# Patient Record
Sex: Male | Born: 1945 | Race: White | Hispanic: No | Marital: Married | Smoking: Current every day smoker
Health system: Southern US, Community
[De-identification: ages and names within clinical notes are randomized; demographics above are authoritative.]

## PROBLEM LIST (undated history)

## (undated) DIAGNOSIS — E669 Obesity, unspecified: Secondary | ICD-10-CM

## (undated) DIAGNOSIS — D751 Secondary polycythemia: Secondary | ICD-10-CM

## (undated) DIAGNOSIS — I10 Essential (primary) hypertension: Secondary | ICD-10-CM

## (undated) DIAGNOSIS — K5792 Diverticulitis of intestine, part unspecified, without perforation or abscess without bleeding: Secondary | ICD-10-CM

## (undated) DIAGNOSIS — G4733 Obstructive sleep apnea (adult) (pediatric): Secondary | ICD-10-CM

## (undated) DIAGNOSIS — K635 Polyp of colon: Secondary | ICD-10-CM

## (undated) HISTORY — DX: Essential (primary) hypertension: I10

## (undated) HISTORY — DX: Obstructive sleep apnea (adult) (pediatric): G47.33

## (undated) HISTORY — DX: Diverticulitis of intestine, part unspecified, without perforation or abscess without bleeding: K57.92

## (undated) HISTORY — DX: Secondary polycythemia: D75.1

## (undated) HISTORY — DX: Polyp of colon: K63.5

## (undated) HISTORY — DX: Obesity, unspecified: E66.9

## (undated) HISTORY — PX: EYE SURGERY: SHX253

---

## 1999-06-05 ENCOUNTER — Inpatient Hospital Stay (HOSPITAL_COMMUNITY): Admission: EM | Admit: 1999-06-05 | Discharge: 1999-06-09 | Payer: Self-pay | Admitting: Emergency Medicine

## 1999-06-05 ENCOUNTER — Encounter: Payer: Self-pay | Admitting: Internal Medicine

## 1999-06-06 ENCOUNTER — Encounter: Payer: Self-pay | Admitting: Emergency Medicine

## 1999-06-08 ENCOUNTER — Encounter: Payer: Self-pay | Admitting: Internal Medicine

## 2003-03-05 HISTORY — PX: COLONOSCOPY: SHX174

## 2003-03-11 ENCOUNTER — Ambulatory Visit (HOSPITAL_COMMUNITY): Admission: RE | Admit: 2003-03-11 | Discharge: 2003-03-11 | Payer: Self-pay | Admitting: Gastroenterology

## 2003-03-11 ENCOUNTER — Encounter (INDEPENDENT_AMBULATORY_CARE_PROVIDER_SITE_OTHER): Payer: Self-pay

## 2005-04-22 ENCOUNTER — Ambulatory Visit: Payer: Self-pay | Admitting: Oncology

## 2005-05-05 ENCOUNTER — Ambulatory Visit: Admission: RE | Admit: 2005-05-05 | Discharge: 2005-05-05 | Payer: Self-pay | Admitting: Oncology

## 2006-03-31 ENCOUNTER — Ambulatory Visit: Payer: Self-pay | Admitting: Family Medicine

## 2006-04-04 ENCOUNTER — Encounter: Admission: RE | Admit: 2006-04-04 | Discharge: 2006-04-04 | Payer: Self-pay | Admitting: Family Medicine

## 2006-04-04 LAB — HM MAMMOGRAPHY: HM Mammogram: NEGATIVE

## 2006-08-12 ENCOUNTER — Ambulatory Visit: Payer: Self-pay | Admitting: Family Medicine

## 2006-09-13 ENCOUNTER — Ambulatory Visit: Payer: Self-pay | Admitting: Family Medicine

## 2008-01-30 ENCOUNTER — Ambulatory Visit: Payer: Self-pay | Admitting: Family Medicine

## 2008-03-25 LAB — HM COLONOSCOPY

## 2008-04-08 ENCOUNTER — Ambulatory Visit: Payer: Self-pay | Admitting: Family Medicine

## 2009-07-31 ENCOUNTER — Ambulatory Visit: Payer: Self-pay | Admitting: Family Medicine

## 2009-09-10 ENCOUNTER — Ambulatory Visit: Payer: Self-pay | Admitting: Family Medicine

## 2010-03-03 ENCOUNTER — Ambulatory Visit: Payer: Self-pay | Admitting: Family Medicine

## 2010-03-12 ENCOUNTER — Ambulatory Visit: Payer: Self-pay | Admitting: Oncology

## 2010-03-17 LAB — CBC WITH DIFFERENTIAL/PLATELET
BASO%: 0.7 % (ref 0.0–2.0)
Basophils Absolute: 0.1 10*3/uL (ref 0.0–0.1)
EOS%: 2.7 % (ref 0.0–7.0)
Eosinophils Absolute: 0.3 10*3/uL (ref 0.0–0.5)
HCT: 50.6 % — ABNORMAL HIGH (ref 38.4–49.9)
HGB: 17.2 g/dL — ABNORMAL HIGH (ref 13.0–17.1)
LYMPH%: 26.1 % (ref 14.0–49.0)
MCH: 30.8 pg (ref 27.2–33.4)
MCHC: 34 g/dL (ref 32.0–36.0)
MCV: 90.6 fL (ref 79.3–98.0)
MONO#: 0.9 10*3/uL (ref 0.1–0.9)
MONO%: 9.4 % (ref 0.0–14.0)
NEUT#: 5.8 10*3/uL (ref 1.5–6.5)
NEUT%: 61.1 % (ref 39.0–75.0)
Platelets: 167 10*3/uL (ref 140–400)
RBC: 5.58 10*6/uL (ref 4.20–5.82)
RDW: 13.5 % (ref 11.0–14.6)
WBC: 9.5 10*3/uL (ref 4.0–10.3)
lymph#: 2.5 10*3/uL (ref 0.9–3.3)

## 2010-03-17 LAB — COMPREHENSIVE METABOLIC PANEL
ALT: 20 U/L (ref 0–53)
AST: 20 U/L (ref 0–37)
Albumin: 3.9 g/dL (ref 3.5–5.2)
Alkaline Phosphatase: 68 U/L (ref 39–117)
BUN: 18 mg/dL (ref 6–23)
CO2: 26 mEq/L (ref 19–32)
Calcium: 9.1 mg/dL (ref 8.4–10.5)
Chloride: 102 mEq/L (ref 96–112)
Creatinine, Ser: 1.16 mg/dL (ref 0.40–1.50)
Glucose, Bld: 103 mg/dL — ABNORMAL HIGH (ref 70–99)
Potassium: 3.6 mEq/L (ref 3.5–5.3)
Sodium: 139 mEq/L (ref 135–145)
Total Bilirubin: 0.6 mg/dL (ref 0.3–1.2)
Total Protein: 6.4 g/dL (ref 6.0–8.3)

## 2010-03-20 LAB — JAK-2 V617F

## 2010-03-30 ENCOUNTER — Encounter: Payer: Self-pay | Admitting: Pulmonary Disease

## 2010-04-13 ENCOUNTER — Ambulatory Visit: Payer: Self-pay | Admitting: Oncology

## 2010-04-24 DIAGNOSIS — I10 Essential (primary) hypertension: Secondary | ICD-10-CM | POA: Insufficient documentation

## 2010-04-24 DIAGNOSIS — D751 Secondary polycythemia: Secondary | ICD-10-CM | POA: Insufficient documentation

## 2010-04-24 DIAGNOSIS — E669 Obesity, unspecified: Secondary | ICD-10-CM | POA: Insufficient documentation

## 2010-04-27 ENCOUNTER — Ambulatory Visit: Payer: Self-pay | Admitting: Pulmonary Disease

## 2010-04-27 DIAGNOSIS — Z9989 Dependence on other enabling machines and devices: Secondary | ICD-10-CM | POA: Insufficient documentation

## 2010-04-27 DIAGNOSIS — G4733 Obstructive sleep apnea (adult) (pediatric): Secondary | ICD-10-CM | POA: Insufficient documentation

## 2010-05-26 ENCOUNTER — Ambulatory Visit (HOSPITAL_BASED_OUTPATIENT_CLINIC_OR_DEPARTMENT_OTHER): Admission: RE | Admit: 2010-05-26 | Discharge: 2010-05-26 | Payer: Self-pay | Admitting: Pulmonary Disease

## 2010-05-26 ENCOUNTER — Encounter: Payer: Self-pay | Admitting: Pulmonary Disease

## 2010-06-03 ENCOUNTER — Ambulatory Visit: Payer: Self-pay | Admitting: Pulmonary Disease

## 2010-06-04 ENCOUNTER — Telehealth (INDEPENDENT_AMBULATORY_CARE_PROVIDER_SITE_OTHER): Payer: Self-pay | Admitting: *Deleted

## 2010-06-11 ENCOUNTER — Ambulatory Visit: Payer: Self-pay | Admitting: Pulmonary Disease

## 2010-07-31 ENCOUNTER — Ambulatory Visit: Payer: Self-pay | Admitting: Pulmonary Disease

## 2010-08-14 ENCOUNTER — Encounter: Payer: Self-pay | Admitting: Pulmonary Disease

## 2010-11-03 NOTE — Assessment & Plan Note (Signed)
Summary: consult for osa   Visit Type:  Initial Consult Copy to:  Eli Hose Primary Provider/Referring Provider:  Sharlot Gowda, MD  CC:  Sleep Consult.  History of Present Illness: The pt is a 64y/o male who I have been asked to see for possible osa.  The pt has a h/o erythrocytosis, as well as retinal vein thrombosis.  The question has been raised whether SDB could be contributed to this.  The pt has been noted to have loud snoring, as well as pauses in his breathing during sleep.  He goes to bed around 11pm, and arises at 6:30am to start his day.  He feels that he is reasonably well rested most days, and feels that his alertness is normal during quiet times.  He will get some sleepiness while in "dark places".  He has some dozing in the evenings  while watching tv, and mild sleep pressure with driving longer distances.  HIs epworth scale today is 7.  His weight is up 15 pounds over the last 2 years.    Preventive Screening-Counseling & Management  Alcohol-Tobacco     Smoking Status: current     Packs/Day: 1.0     Year Started: 1979  Current Medications (verified): 1)  Aspirin 81 Mg Tbec (Aspirin) .... Take 1 Tablet By Mouth Once A Day 2)  Hydrochlorothiazide 12.5 Mg Caps (Hydrochlorothiazide) .... Take 1 Tablet By Mouth Once A Day 3)  Fish Oil 1200 Mg Caps (Omega-3 Fatty Acids) .... Take 1 Tablet By Mouth Once A Day 4)  Nasal Spray .Marland Kitchen.. 2 Sprays Each Nostril Once Daily  Allergies (verified): No Known Drug Allergies  Past History:  Past Medical History: Erythrocytosis OBESITY (ICD-278.00) HYPERTENSION (ICD-401.9) retinal vein thrombosis  Past Surgical History: none  Family History: Reviewed history and no changes required. Family History Breast Cancer-mother Family History MI/Heart Attack-father  Social History: Reviewed history from 04/24/2010 and no changes required. pt is married.-Saabir Blyth pt has 1 son. pt is retired from [employer]. pt smokes  intermittently x 30 years. Patient is a current smoker. (intermittent upon daily stressors) Smoking Status:  current Packs/Day:  1.0  Review of Systems       The patient complains of shortness of breath with activity, acid heartburn, nasal congestion/difficulty breathing through nose, and sneezing.  The patient denies shortness of breath at rest, productive cough, non-productive cough, coughing up blood, chest pain, irregular heartbeats, indigestion, loss of appetite, weight change, abdominal pain, difficulty swallowing, sore throat, tooth/dental problems, headaches, itching, ear ache, anxiety, depression, hand/feet swelling, joint stiffness or pain, rash, change in color of mucus, and fever.    Vital Signs:  Patient profile:   65 year old male Height:      73 inches Weight:      305 pounds BMI:     40.39 O2 Sat:      94 % on Room air Temp:     98.1 degrees F oral Pulse rate:   66 / minute BP sitting:   130 / 84  (left arm) Cuff size:   large  Vitals Entered By: Arman Filter LPN (April 27, 2010 10:18 AM)  O2 Flow:  Room air CC: Sleep Consult Comments Medications reviewed with patient Arman Filter LPN  April 27, 2010 10:18 AM    Physical Exam  General:  obese male in nad Nose:  very large turbinates with near obstruction bilaterlly.  septal deviation to left Mouth:  severe elongation of soft palate and uvula, +side wall narrowing.  Neck:  no jvd, tmg, LN Lungs:  clear to auscultation Heart:  rrr, no mrg Abdomen:  soft and nontender, bs+ Extremities:  no significant edema noted, no cyanosis pulses intact distally Neurologic:  alert and oriented, moves all 4.   Impression & Recommendations:  Problem # 1:  OBSTRUCTIVE SLEEP APNEA (ICD-327.23) the pt's history is suggestive of osa, and related chronic nocturnal hypoxemia could lead to erythrocytosis.  He is morbidly obese, and has very abnormal upper airway anatomy.  I have had a long discussion with the pt about sleep  apnea, including its impact on QOL and CV health. I think he needs to have a sleep study at this point, and he is agreeable.  Medications Added to Medication List This Visit: 1)  Aspirin 81 Mg Tbec (Aspirin) .... Take 1 tablet by mouth once a day 2)  Hydrochlorothiazide 12.5 Mg Caps (Hydrochlorothiazide) .... Take 1 tablet by mouth once a day 3)  Fish Oil 1200 Mg Caps (Omega-3 fatty acids) .... Take 1 tablet by mouth once a day 4)  Nasal Spray  .Marland Kitchen.. 2 sprays each nostril once daily  Other Orders: Consultation Level IV (16109) Sleep Disorder Referral (Sleep Disorder)  Patient Instructions: 1)  will setup for sleep study, and will arrange followup once results are available.   Immunization History:  Influenza Immunization History:    Influenza:  historical (07/07/2009)  Pneumovax Immunization History:    Pneumovax:  historical (04/08/2008)

## 2010-11-03 NOTE — Assessment & Plan Note (Signed)
Summary: rov for osa   Visit Type:  Follow-up Primary Provider/Referring Provider:  Sharlot Gowda, MD  CC:  CPAP follow-up...he has adjusted to the pressure...thinks humidity may be too high...wears approx 7 hours every night...still adjusting to the mask.  History of Present Illness: the pt comes in today for f/u of his osa.  At the last visit, he was started on cpap, and overall has been doing well with the device.  He is sleeping better, and has seen an improvement in his daytime alertness.  He is having issues with excessive humidity, but was not aware of the heater adjustment that would decrease this.  He also has some issue with mask fit and claustrophobia, but this is improving.    Current Medications (verified): 1)  Aspirin 81 Mg Tbec (Aspirin) .... Take 1 Tablet By Mouth Once A Day 2)  Hydrochlorothiazide 12.5 Mg Caps (Hydrochlorothiazide) .... Take 1 Tablet By Mouth Once A Day 3)  Fish Oil 1200 Mg Caps (Omega-3 Fatty Acids) .... Take 1 Tablet By Mouth Once A Day 4)  Nasal Spray .Marland Kitchen.. 2 Sprays Each Nostril Once Daily  Allergies (verified): No Known Drug Allergies  Past History:  Past medical, surgical, family and social histories (including risk factors) reviewed, and no changes noted (except as noted below).  Past Medical History: Reviewed history from 04/27/2010 and no changes required. Erythrocytosis OBESITY (ICD-278.00) HYPERTENSION (ICD-401.9) retinal vein thrombosis  Past Surgical History: Reviewed history from 04/27/2010 and no changes required. none  Family History: Reviewed history from 04/27/2010 and no changes required. Family History Breast Cancer-mother Family History MI/Heart Attack-father  Social History: Reviewed history from 04/27/2010 and no changes required. pt is married.-Macen Joslin pt has 1 son. pt is retired from [employer]. pt smokes intermittently x 30 years. Patient is a current smoker. (intermittent upon daily stressors)  Review of  Systems       The patient complains of nasal congestion/difficulty breathing through nose.  The patient denies shortness of breath with activity, shortness of breath at rest, productive cough, non-productive cough, coughing up blood, chest pain, irregular heartbeats, acid heartburn, indigestion, loss of appetite, weight change, abdominal pain, difficulty swallowing, sore throat, tooth/dental problems, headaches, sneezing, itching, ear ache, anxiety, depression, hand/feet swelling, joint stiffness or pain, rash, change in color of mucus, and fever.    Vital Signs:  Patient profile:   65 year old male Height:      73 inches (185.42 cm) Weight:      313 pounds (142.27 kg) BMI:     41.44 O2 Sat:      96 % on Room air Temp:     97.6 degrees F (36.44 degrees C) oral Pulse rate:   65 / minute BP sitting:   140 / 84  (left arm) Cuff size:   large  Vitals Entered By: Michel Bickers CMA (July 31, 2010 10:51 AM)  O2 Sat at Rest %:  96 O2 Flow:  Room air CC: CPAP follow-up...he has adjusted to the pressure...thinks humidity may be too high...wears approx 7 hours every night...still adjusting to the mask Comments Medications reviewed with patient Michel Bickers [facility]  July 31, 2010 10:52 AM   Physical Exam  General:  obese male in nad Nose:  no skin breakdown or pressure necrosis from cpap mask. no drainage or purulence Lungs:  clear to auscultation Extremities:  no significant edema or cyanosis  Neurologic:  alert and oriented, does not appear sleepy, moves all 4.   Impression & Recommendations:  Problem # 1:  OBSTRUCTIVE SLEEP APNEA (ICD-327.23) the pt has been wearing cpap compliantly, and clearly sees an improvement in his sleep and daytime alertness.  He is having small tolerance issues with humidity, claustrophobia, mask, and I have addressed each one of these with him.  He will work on troubleshooting.  Will need to optimize his pressure for him, and I have encouraged him to work  aggressively on weight loss. Care Plan:  At this point, will arrange for the patient's machine to be changed over to auto mode for 2 weeks to optimize their pressure.  I will review the downloaded data once sent by dme, and also evaluate for compliance, leaks, and residual osa.  I will call the patient and dme to discuss the results, and have the patient's machine set appropriately.  This will serve as the pt's cpap pressure titration.  Other Orders: Est. Patient Level IV (16109) DME Referral (DME)  Patient Instructions: 1)  adjust heated humidity up or down to your comfort.  I suspect you need more. 2)  will set your machine on auto mode for 2 weeks, and will call you with your optimal pressure. 3)  work on weight loss 4)  followup with me in 6mos.   Immunization History:  Influenza Immunization History:    Influenza:  historical (07/10/2010)

## 2010-11-03 NOTE — Progress Notes (Signed)
Summary: need to sched ov with kc   Phone Note Outgoing Call Call back at [facility] Phone [phone]   Call placed by: Arman Filter LPN,  June 04, 2010 4:53 PM Call placed to: Patient Summary of Call: per kc, pt needs ov with kc to discuss sleep study results.  ATC pt at home #.  Line busy x 3.  Will try back again later.  Aundra Millet Reynolds LPN  June 04, 2010 4:53 PM  Initial call taken by: Arman Filter LPN,  June 04, 2010 4:53 PM  Follow-up for Phone Call        called and spoke with pt.  pt scheduled to see [facility] 06-11-2010 at 9:45am.  Arman Filter LPN  June 05, 2010 5:07 PM     n

## 2010-11-03 NOTE — Assessment & Plan Note (Signed)
Summary: rov to discuss sleep study results/lmr   Copy to:  Milderd Meager Primary Provider/Referring Provider:  Sharlot Gowda, MD  CC:  Follow up on sleep study results and Pt wants to have sinus surgery and is not able to due to diffuculty breathing  through the nose.  History of Present Illness: the pt comes in today for f/u of his recent sleep study.  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  I have reviewed the study in detail with the pt, and answered all of his questions.  Preventive Screening-Counseling & Management  Alcohol-Tobacco     Smoking Status: quit     Packs/Day: 1.0     Year Started: 1979     Year Quit: 2011  Current Medications (verified): 1)  Aspirin 81 Mg Tbec (Aspirin) .... Take 1 Tablet By Mouth Once A Day 2)  Hydrochlorothiazide 12.5 Mg Caps (Hydrochlorothiazide) .... Take 1 Tablet By Mouth Once A Day 3)  Fish Oil 1200 Mg Caps (Omega-3 Fatty Acids) .... Take 1 Tablet By Mouth Once A Day 4)  Nasal Spray .Marland Kitchen.. 2 Sprays Each Nostril Once Daily  Allergies (verified): No Known Drug Allergies  Social History: Smoking Status:  quit  Review of Systems       The patient complains of indigestion, nasal congestion/difficulty breathing through nose, and sneezing.  The patient denies shortness of breath with activity, shortness of breath at rest, productive cough, non-productive cough, coughing up blood, chest pain, irregular heartbeats, acid heartburn, loss of appetite, weight change, abdominal pain, difficulty swallowing, sore throat, tooth/dental problems, headaches, itching, ear ache, anxiety, depression, hand/feet swelling, joint stiffness or pain, rash, change in color of mucus, and fever.    Vital Signs:  Patient profile:   65 year old male Height:      73 inches Weight:      306.50 pounds O2 Sat:      97 % on Room air Temp:     98.0 degrees F oral Pulse rate:   70 / minute BP sitting:   158 / 96  (left arm) Cuff  size:   large  Vitals Entered By: Carver Fila (June 11, 2010 9:31 AM)  O2 Flow:  Room air CC: Follow up on sleep study results, Pt wants to have sinus surgery and is not able to due to diffuculty breathing  through the nose Is Patient Diabetic? No Comments meds and allergies updated Phone number updated Carver Fila  June 11, 2010 9:31 AM    Physical Exam  General:  obese male in nad Nose:  obstructed bilat. Extremities:  no edema or cyanosis  Neurologic:  alert, does not appear sleepy, moves all 4.   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  I have discussed his treatment options with him, but asked him to focus primarily on cpap and weight loss.  I will set the patient up on cpap at a moderate pressure level to allow for desensitization, and will troubleshoot the device over the next 4-6weeks if needed.  The pt is to call me if having issues with tolerance.  Will then optimize the pressure once patient is able to wear cpap on a consistent basis.  Other Orders: Est. Patient Level III (16109) DME Referral (DME)  Patient Instructions: 1)  will start on cpap for your sleep apnea.  Please call if  having tolerance issues. 2)  work on weight loss 3)  followup with me in 5 weeks

## 2010-11-03 NOTE — Letter (Signed)
Summary: Statement of Medical Necessity/ [facility]  Statement of Medical Necessity/ [facility]   Imported By: Lennie Odor 08/18/2010 11:41:31  _____________________________________________________________________  External Attachment:    Type:   Image     Comment:   External Document

## 2010-11-03 NOTE — Letter (Signed)
Summary: [facility]  [facility]   Imported By: Lester Milford 05/14/2010 09:14:09  _____________________________________________________________________  External Attachment:    Type:   Image     Comment:   External Document

## 2011-02-10 ENCOUNTER — Telehealth: Payer: Self-pay | Admitting: Family Medicine

## 2011-02-10 MED ORDER — HYDROCHLOROTHIAZIDE 12.5 MG PO CAPS: 12.5000 mg | ORAL_CAPSULE | Freq: Every day | ORAL | Status: DC

## 2011-02-10 MED ORDER — FLUNISOLIDE 25 MCG/ACT (0.025%) NA SOLN: 2.0000 | Freq: Two times a day (BID) | NASAL | Status: DC

## 2011-02-10 NOTE — Telephone Encounter (Signed)
PT NEEDS REFILL HCTZ & FLONASE TO [facility], PT IS GOING OUT OF THE COUN[place] FOR 2 MONTHS, HAS CPE SCHEDULED 04/12/11

## 2011-02-15 ENCOUNTER — Other Ambulatory Visit: Payer: Self-pay | Admitting: Family Medicine

## 2011-02-15 NOTE — Telephone Encounter (Signed)
Pt called said we order Flucasolide and he uses Fluticson 50 mcg nasal spray 2 sprays qd - bid.  [facility].  Pt does not want the Flucasolide.

## 2011-02-15 NOTE — Telephone Encounter (Signed)
[facility] got it fixed pt has re fills on  His flonase  Pharmacy informed me  Pt informed med at [facility]

## 2011-02-19 NOTE — Op Note (Signed)
   NAME:  LYELL, CLUGSTON NO.:  [id number]   MEDICAL RECORD NO.:  [id number]                   PATIENT TYPE:  AMB   LOCATION:  ENDO                                 FACILITY:  [facility]   PHYSICIAN:  Danise Edge, M.D.                DATE OF BIRTH:  January 17, 1946   DATE OF PROCEDURE:  03/11/2003  DATE OF DISCHARGE:                                 OPERATIVE REPORT   PROCEDURE:  Screening colonoscopy with polypectomy.   INDICATIONS FOR PROCEDURE:  Mr. Martavion Couper is a 65 year old male born  1945-11-15. On December 23, 1997, proctocolonoscopy to the cecum resulted in  the removal of a 2 mm adenomatous polyp from the distal colon. Mr. Thomann  is scheduled to undergo a repeat screening colonoscopy with polypectomy to  prevent colon cancer.   ENDOSCOPIST:  Charolett Bumpers, M.D.   PREMEDICATION:  Versed 10 mg, Demerol 50 mg .   DESCRIPTION OF PROCEDURE:  After obtaining informed consent, Mr. Lipson  was placed in the left lateral decubitus position. I administered  intravenous Demerol and intravenous Versed to achieve conscious sedation for  the procedure. The patient's blood pressure, oxygen saturation and cardiac  rhythm were monitored throughout the procedure and documented in the medical  record.   Anal inspection was normal. Digital rectal exam was normal. The Olympus  adult colonoscope was introduced into the rectum and advanced to the cecum.  A normal appearing ileocecal valve was intubated and the distal ileum  inspected. Colonic preparation for the exam today was excellent.   RECTUM:  Normal.   SIGMOID COLON AND DESCENDING COLON:  From the distal sigmoid colon at 20 cm  from the anal verge, a 2 mm sessile polyp was removed with electrocautery  snare.   SPLENIC FLEXURE:  Normal.   TRANSVERSE COLON:  Normal.   HEPATIC FLEXURE:  Normal.   ASCENDING COLON:  Normal.   CECUM AND ILEOCECAL VALVE:  Normal.   DISTAL ILEUM:  Normal.   ASSESSMENT:  From the distal sigmoid colon, a 2 mm sessile polyp was  removed;  otherwise, normal proctocolonoscopy to the cecum.    RECOMMENDATIONS:  1. Mr. Stay should undergo a repeat colonoscopy in five years.  2. Mr. Inabinet probably has sleep apnea syndrome. After administering     conscious sedation, Mr. Needle developed snoring associated     respirations with oxygen desaturation to approximately 86%.                                               Danise Edge, M.D.    MJ/MEDQ  D:  03/11/2003  T:  03/11/2003  Job:  [id number]   cc:   Sharlot Gowda, M.D.  [address] [place], [place] [address]  Fax: [phone]

## 2011-03-22 ENCOUNTER — Encounter: Payer: Self-pay | Admitting: Family Medicine

## 2011-04-09 ENCOUNTER — Encounter: Payer: Self-pay | Admitting: Family Medicine

## 2011-04-12 ENCOUNTER — Ambulatory Visit (INDEPENDENT_AMBULATORY_CARE_PROVIDER_SITE_OTHER): Payer: BC Managed Care – PPO | Admitting: Family Medicine

## 2011-04-12 ENCOUNTER — Encounter: Payer: Self-pay | Admitting: Family Medicine

## 2011-04-12 VITALS — BP 130/82 | HR 74 | Ht 73.0 in | Wt 290.0 lb

## 2011-04-12 DIAGNOSIS — J301 Allergic rhinitis due to pollen: Secondary | ICD-10-CM

## 2011-04-12 DIAGNOSIS — E669 Obesity, unspecified: Secondary | ICD-10-CM

## 2011-04-12 DIAGNOSIS — Z0000 Encounter for general adult medical examination without abnormal findings: Secondary | ICD-10-CM

## 2011-04-12 DIAGNOSIS — D751 Secondary polycythemia: Secondary | ICD-10-CM

## 2011-04-12 DIAGNOSIS — Z79899 Other long term (current) drug therapy: Secondary | ICD-10-CM

## 2011-04-12 DIAGNOSIS — G4730 Sleep apnea, unspecified: Secondary | ICD-10-CM

## 2011-04-12 DIAGNOSIS — I10 Essential (primary) hypertension: Secondary | ICD-10-CM

## 2011-04-12 LAB — POCT URINALYSIS DIPSTICK
Bilirubin, UA: NEGATIVE
Blood, UA: NEGATIVE
Glucose, UA: NEGATIVE
Nitrite, UA: NEGATIVE
Spec Grav, UA: 1.02
Urobilinogen, UA: NEGATIVE
pH, UA: 5

## 2011-04-12 MED ORDER — HYDROCHLOROTHIAZIDE 12.5 MG PO CAPS: 12.5000 mg | ORAL_CAPSULE | Freq: Every day | ORAL | Status: DC

## 2011-04-12 MED ORDER — TADALAFIL 20 MG PO TABS: 20.0000 mg | ORAL_TABLET | Freq: Every day | ORAL | Status: DC | PRN

## 2011-04-12 MED ORDER — FLUTICASONE PROPIONATE 50 MCG/ACT NA SUSP: 2.0000 | Freq: Every day | NASAL | Status: DC

## 2011-04-12 MED ORDER — DIAZEPAM 2 MG PO TABS: 2.0000 mg | ORAL_TABLET | Freq: Three times a day (TID) | ORAL | Status: AC | PRN

## 2011-04-12 NOTE — Patient Instructions (Signed)
I will call in  the Valium to help with her next procedure. Continue on all your medications. Continue with your weight loss. Followup here as needed.

## 2011-04-12 NOTE — Progress Notes (Signed)
Subjective:    Patient ID: Lee Kim, male    DOB: 1946/06/03, 65 y.o.   MRN: [id number]  HPI He is here for a complete examination. He has multiple issues. He is about to have cataract surgery and would like Valium which she has found useful in the past to help keep his blood pressure down as well as anxiety. He does have underlying allergies and as well as a sleep apnea. He is on CPAP and seems to be doing well. He has seen ENT and surgery was recommended. His nocturia has improved since she's been on the CPAP. Has been on a weight loss regimen and lost several pounds. He has a history of erythrocytosis but has not followed up with his hematologist recently. He has been under a tremendous amount of stress dealing with his mother with cancer. His wife was also recently diagnosed with CLL. He does have a son in law school who is planning on getting married and they are financially connected with the son. He did quit smoking. His marriage is going well. He is retired.   Review of Systems Negative except as above    Objective:   Physical Exam BP 130/82  Pulse 74  Ht 6\' 1"  (1.854 m)  Wt 290 lb (131.543 kg)  BMI 38.26 kg/m2  General Appearance:    Alert, cooperative, no distress, appears stated age  Head:    Normocephalic, without obvious abnormality, atraumatic  Eyes:    PERRL, conjunctiva/corneas clear, EOM's intact, fundi    benign  Ears:    Normal TM's and external ear canals  Nose:   Nares normal, mucosa normal, no drainage or sinus   tenderness  Throat:   Lips, mucosa, and tongue normal; teeth and gums normal  Neck:   Supple, no lymphadenopathy;  thyroid:  no   enlargement/tenderness/nodules; no carotid   bruit or JVD  Back:    Spine nontender, no curvature, ROM normal, no CVA     tenderness  Lungs:     Clear to auscultation bilaterally without wheezes, rales or     ronchi; respirations unlabored  Chest Wall:    No tenderness or deformity   Heart:    Regular rate and rhythm,  S1 and S2 normal, no murmur, rub   or gallop  Breast Exam:    No chest wall tenderness, masses or gynecomastia  Abdomen:     Soft, non-tender, nondistended, normoactive bowel sounds,    no masses, no hepatosplenomegaly  Genitalia:    Normal male external genitalia without lesions.  Testicles without masses.  No inguinal hernias.  Rectal:    Normal sphincter tone, no masses or tenderness; guaiac negative stool.  Prostate smooth, no nodules, not enlarged.  Extremities:   No clubbing, cyanosis or edema  Pulses:   2+ and symmetric all extremities  Skin:   Skin color, texture, turgor normal, no rashes or lesions  Lymph nodes:   Cervical, supraclavicular, and axillary nodes normal  Neurologic:   CNII-XII intact, normal strength, sensation and gait; reflexes 2+ and symmetric throughout          Psych:   Normal mood, affect, hygiene and grooming.           Assessment & Plan:  Hypertension. Sleep apnea. Allergic rhinitis.  erythrocytosis. Situational stress. Obesity. His medications were renewed. I discussed adding Allegra to his regimen. Encouraged him to try to control his nasal congestion symptoms with medications rather than surgery and he is very comfortable with this.  Routine blood screening also done . He is handling the stress as well as can be expected.

## 2011-04-13 LAB — CBC WITH DIFFERENTIAL/PLATELET
Basophils Relative: 1 % (ref 0–1)
Eosinophils Absolute: 0.2 10*3/uL (ref 0.0–0.7)
Eosinophils Relative: 2 % (ref 0–5)
Hemoglobin: 18 g/dL — ABNORMAL HIGH (ref 13.0–17.0)
Lymphs Abs: 2.3 10*3/uL (ref 0.7–4.0)
MCH: 30.9 pg (ref 26.0–34.0)
MCHC: 33.8 g/dL (ref 30.0–36.0)
MCV: 91.4 fL (ref 78.0–100.0)
Monocytes Relative: 11 % (ref 3–12)
Neutrophils Relative %: 63 % (ref 43–77)
RBC: 5.82 MIL/uL — ABNORMAL HIGH (ref 4.22–5.81)

## 2011-04-13 LAB — COMPREHENSIVE METABOLIC PANEL
Albumin: 4.2 g/dL (ref 3.5–5.2)
Alkaline Phosphatase: 64 U/L (ref 39–117)
BUN: 20 mg/dL (ref 6–23)
CO2: 24 mEq/L (ref 19–32)
Calcium: 9.3 mg/dL (ref 8.4–10.5)
Glucose, Bld: 96 mg/dL (ref 70–99)
Potassium: 3.9 mEq/L (ref 3.5–5.3)

## 2011-04-13 LAB — LIPID PANEL
LDL Cholesterol: 93 mg/dL (ref 0–99)
Total CHOL/HDL Ratio: 4 Ratio
VLDL: 26 mg/dL (ref 0–40)

## 2011-04-14 ENCOUNTER — Telehealth: Payer: Self-pay

## 2011-04-14 NOTE — Telephone Encounter (Signed)
Talked with wife told here labs look good

## 2011-04-19 ENCOUNTER — Telehealth: Payer: Self-pay | Admitting: Family Medicine

## 2011-04-19 NOTE — Telephone Encounter (Signed)
Dr. Susann Givens do you know anything about this

## 2011-04-19 NOTE — Telephone Encounter (Signed)
Is this ok?

## 2011-04-19 NOTE — Telephone Encounter (Signed)
Called 5mg  valium #6 with 0 refill

## 2011-08-30 ENCOUNTER — Encounter: Payer: Self-pay | Admitting: Family Medicine

## 2012-02-08 ENCOUNTER — Telehealth: Payer: Self-pay | Admitting: Family Medicine

## 2012-02-08 MED ORDER — HYDROCHLOROTHIAZIDE 12.5 MG PO CAPS: 12.5000 mg | ORAL_CAPSULE | Freq: Every day | ORAL | Status: DC

## 2012-02-08 NOTE — Telephone Encounter (Signed)
Sent med in pt has appt in sept

## 2012-04-12 ENCOUNTER — Other Ambulatory Visit: Payer: Self-pay | Admitting: Family Medicine

## 2012-04-12 NOTE — Telephone Encounter (Signed)
Patient to come in for office visit before this medication runs out.

## 2012-05-12 ENCOUNTER — Other Ambulatory Visit: Payer: Self-pay | Admitting: Family Medicine

## 2012-05-30 ENCOUNTER — Encounter: Payer: Self-pay | Admitting: Internal Medicine

## 2012-06-13 ENCOUNTER — Ambulatory Visit (INDEPENDENT_AMBULATORY_CARE_PROVIDER_SITE_OTHER): Payer: BC Managed Care – PPO | Admitting: Family Medicine

## 2012-06-13 ENCOUNTER — Encounter: Payer: Self-pay | Admitting: Family Medicine

## 2012-06-13 ENCOUNTER — Telehealth: Payer: Self-pay | Admitting: Family Medicine

## 2012-06-13 VITALS — BP 122/80 | HR 82 | Ht 73.0 in | Wt 300.0 lb

## 2012-06-13 DIAGNOSIS — J309 Allergic rhinitis, unspecified: Secondary | ICD-10-CM

## 2012-06-13 DIAGNOSIS — E669 Obesity, unspecified: Secondary | ICD-10-CM

## 2012-06-13 DIAGNOSIS — Z0000 Encounter for general adult medical examination without abnormal findings: Secondary | ICD-10-CM

## 2012-06-13 DIAGNOSIS — D751 Secondary polycythemia: Secondary | ICD-10-CM

## 2012-06-13 DIAGNOSIS — I10 Essential (primary) hypertension: Secondary | ICD-10-CM

## 2012-06-13 DIAGNOSIS — J069 Acute upper respiratory infection, unspecified: Secondary | ICD-10-CM

## 2012-06-13 DIAGNOSIS — H6120 Impacted cerumen, unspecified ear: Secondary | ICD-10-CM

## 2012-06-13 DIAGNOSIS — G4733 Obstructive sleep apnea (adult) (pediatric): Secondary | ICD-10-CM

## 2012-06-13 LAB — POCT URINALYSIS DIPSTICK
Ketones, UA: NEGATIVE
Leukocytes, UA: NEGATIVE
Nitrite, UA: NEGATIVE
Protein, UA: 30
Urobilinogen, UA: NEGATIVE
pH, UA: 5

## 2012-06-13 LAB — CBC WITH DIFFERENTIAL/PLATELET
Basophils Relative: 0 % (ref 0–1)
Eosinophils Absolute: 0.2 10*3/uL (ref 0.0–0.7)
Hemoglobin: 17.8 g/dL — ABNORMAL HIGH (ref 13.0–17.0)
MCH: 31.1 pg (ref 26.0–34.0)
MCHC: 34.5 g/dL (ref 30.0–36.0)
Monocytes Absolute: 0.9 10*3/uL (ref 0.1–1.0)
Monocytes Relative: 9 % (ref 3–12)
Neutrophils Relative %: 73 % (ref 43–77)

## 2012-06-13 MED ORDER — BUDESONIDE 32 MCG/ACT NA SUSP: 2.0000 | Freq: Every day | NASAL | Status: DC

## 2012-06-13 NOTE — Progress Notes (Signed)
  Subjective:    Patient ID: Lee Kim, male    DOB: [birth year], 66 y.o.   MRN: [id number]  HPI Approximately one month ago he had difficulty with head and chest congestion and cough. Symptoms almost went away but yesterday he developed head and chest congestion again with right ear discomfort. He does have underlying allergies but has been unable to use a nasal steroid sprays due to congestion. He has seen ENT and was told he needed surgery for turbinate reduction. He was taking one baby aspirin per day but found that he was having skin bleeding issues very easily and stop the medication. The bleeding been diminished. He does have a history of sleep apnea and uses a full mask because of difficulty with with breathing. Review of Systems     Objective:   Physical Exam alert and in no distress. Tympanic membranes and canals are normal after cerumen was removed from both ears. Throat is clear. Tonsils are normal. Neck is supple without adenopathy or thyromegaly. Cardiac exam shows a regular sinus rhythm without murmurs or gallops. Lungs are clear to auscultation.        Assessment & Plan:   1. Routine general medical examination at a health care facility  POCT Urinalysis Dipstick, CBC with Differential, Comprehensive metabolic panel, Lipid panel  2. OBSTRUCTIVE SLEEP APNEA    3. HYPERTENSION  CBC with Differential, Comprehensive metabolic panel, Lipid panel  4. OBESITY    5. Acute URI    6. Allergic rhinitis, mild  budesonide (RHINOCORT AQUA) 32 MCG/ACT nasal spray  7. Ear build-up  Ear wax removal, Ear wax removal  8. ERYTHROCYTOSIS    Use Afrin at night. Try the nasal steroid after the Afrin has kicked in the open up your sinuses. Other than the Afrin, supportive care for the URI. Encouraged him to work on weight loss to help both with his breathing he and general health.

## 2012-06-13 NOTE — Patient Instructions (Addendum)
Use Afrin at night. Try the nasal steroid after the Afrin has kicked in the open up her sinuses

## 2012-06-14 ENCOUNTER — Other Ambulatory Visit: Payer: Self-pay | Admitting: Family Medicine

## 2012-06-14 LAB — LIPID PANEL
HDL: 41 mg/dL (ref >39)
LDL Cholesterol: 79 mg/dL (ref 0–99)
Triglycerides: 140 mg/dL (ref <150)
VLDL: 28 mg/dL (ref 0–40)

## 2012-06-14 LAB — COMPREHENSIVE METABOLIC PANEL
Alkaline Phosphatase: 65 U/L (ref 39–117)
BUN: 18 mg/dL (ref 6–23)
Creat: 1.13 mg/dL (ref 0.50–1.35)
Glucose, Bld: 97 mg/dL (ref 70–99)
Sodium: 140 mEq/L (ref 135–145)
Total Bilirubin: 0.5 mg/dL (ref 0.3–1.2)

## 2012-06-15 ENCOUNTER — Telehealth: Payer: Self-pay | Admitting: Family Medicine

## 2012-06-15 NOTE — Telephone Encounter (Signed)
LM

## 2012-09-05 ENCOUNTER — Other Ambulatory Visit: Payer: Self-pay | Admitting: Family Medicine

## 2012-09-05 NOTE — Telephone Encounter (Signed)
Is this ok?

## 2012-10-19 ENCOUNTER — Other Ambulatory Visit: Payer: Self-pay

## 2012-10-19 ENCOUNTER — Telehealth: Payer: Self-pay | Admitting: Internal Medicine

## 2012-10-19 MED ORDER — HYDROCHLOROTHIAZIDE 12.5 MG PO CAPS: 12.5000 mg | ORAL_CAPSULE | Freq: Every day | ORAL | Status: DC

## 2012-10-19 NOTE — Telephone Encounter (Signed)
PT NEEDED 90 DAY SUPPLY

## 2012-10-19 NOTE — Telephone Encounter (Signed)
SENT IN PT B/P MED

## 2012-12-27 ENCOUNTER — Telehealth: Payer: Self-pay | Admitting: Family Medicine

## 2012-12-27 NOTE — Telephone Encounter (Signed)
I called pt to set up ov for bp check as Dr Susann Givens received notification from Dr. Candelaria Celeste of [facility] of Med pt bp 180/100.  Pt advised he is having surgery today and will call us back when he is up to it to come in.  Pt also states Dr. Hyacinth Meeker did an ekg while there.

## 2013-01-17 ENCOUNTER — Encounter: Payer: Self-pay | Admitting: Family Medicine

## 2013-01-17 ENCOUNTER — Ambulatory Visit (INDEPENDENT_AMBULATORY_CARE_PROVIDER_SITE_OTHER): Payer: BC Managed Care – PPO | Admitting: Family Medicine

## 2013-01-17 VITALS — BP 150/90 | HR 90 | Wt 297.0 lb

## 2013-01-17 DIAGNOSIS — I10 Essential (primary) hypertension: Secondary | ICD-10-CM

## 2013-01-17 MED ORDER — LISINOPRIL-HYDROCHLOROTHIAZIDE 10-12.5 MG PO TABS: 1.0000 | ORAL_TABLET | Freq: Every day | ORAL | Status: DC

## 2013-01-17 NOTE — Progress Notes (Signed)
  Subjective:    Patient ID: Lee Kim, male    DOB: [birth year], 67 y.o.   MRN: [id number]  HPI He is here for consult concerning his blood pressure. He does have underlying hypertension: Recently he has had elevations. He recently had a detached retina with repair and blood pressure during that visit was 180/100.   Review of Systems     Objective:   Physical Exam Alert and in no distress. Blood pressure is recorded.       Assessment & Plan:  HYPERTENSION - Plan: lisinopril-hydrochlorothiazide (PRINZIDE,ZESTORETIC) 10-12.5 MG per tablet I discussed the need to increase his medication. He will bring his blood pressure cuff in to measure against ours as he states he does have whitecoat hypertension. Recheck here one month.

## 2013-01-17 NOTE — Patient Instructions (Signed)
Bring your blood pressure cuff in with you

## 2013-02-19 ENCOUNTER — Ambulatory Visit (INDEPENDENT_AMBULATORY_CARE_PROVIDER_SITE_OTHER): Payer: BC Managed Care – PPO | Admitting: Family Medicine

## 2013-02-19 ENCOUNTER — Encounter: Payer: Self-pay | Admitting: Family Medicine

## 2013-02-19 VITALS — BP 150/100 | HR 88 | Wt 295.0 lb

## 2013-02-19 DIAGNOSIS — Z79899 Other long term (current) drug therapy: Secondary | ICD-10-CM

## 2013-02-19 DIAGNOSIS — I10 Essential (primary) hypertension: Secondary | ICD-10-CM

## 2013-02-19 DIAGNOSIS — R9431 Abnormal electrocardiogram [ECG] [EKG]: Secondary | ICD-10-CM

## 2013-02-19 LAB — CBC WITH DIFFERENTIAL/PLATELET
HCT: 51.3 % (ref 39.0–52.0)
Hemoglobin: 17.9 g/dL — ABNORMAL HIGH (ref 13.0–17.0)
Lymphs Abs: 2.3 10*3/uL (ref 0.7–4.0)
MCH: 31.1 pg (ref 26.0–34.0)
Monocytes Absolute: 0.9 10*3/uL (ref 0.1–1.0)
Monocytes Relative: 9 % (ref 3–12)
Neutro Abs: 5.9 10*3/uL (ref 1.7–7.7)
Neutrophils Relative %: 62 % (ref 43–77)
RBC: 5.76 MIL/uL (ref 4.22–5.81)

## 2013-02-19 NOTE — Progress Notes (Signed)
  Subjective:    Patient ID: Lee Kim, male    DOB: 1945/12/07, 67 y.o.   MRN: [id number]  HPI He is here for recheck on his blood pressure. He has been getting some high readings at home and is here for followup. He has been on his blood pressure medication and has had no difficulty with that.   Review of Systems     Objective:   Physical Exam Alert and in no distress. Irregular rhythm was noted when checking the blood pressure. EKG did show several changes including first degree block, incomplete right bundle and a probable sinus arrhythmia.       Assessment & Plan:  HYPERTENSION - Plan: EKG 12-Lead, CBC with Differential, Comprehensive metabolic panel, Lipid panel, Ambulatory referral to Cardiology  Abnormal EKG - Plan: CBC with Differential, Comprehensive metabolic panel, Lipid panel, Ambulatory referral to Cardiology  Encounter for long-term (current) use of other medications - Plan: CBC with Differential, Comprehensive metabolic panel, Lipid panel discussed holding off on any further eye evaluation until we get his blood pressure and cardiac status taking care of.

## 2013-02-19 NOTE — Progress Notes (Signed)
PATIENT STATES B/P IS RUNNING HIGH MANUAL B/P WAS 150/100 88 PULSE HIS MACHINE 189/130 63 PULSE  PATIENT HAS APPT WITH DR.SPENCER TILLEY Feb 20 2013 AT 1:30 PT IS AWARE WILL FAX ALL INFO TO THEM

## 2013-02-20 ENCOUNTER — Ambulatory Visit: Payer: BC Managed Care – PPO | Admitting: Family Medicine

## 2013-02-20 ENCOUNTER — Encounter: Payer: Self-pay | Admitting: Cardiology

## 2013-02-20 LAB — COMPREHENSIVE METABOLIC PANEL
Albumin: 4 g/dL (ref 3.5–5.2)
CO2: 24 mEq/L (ref 19–32)
Calcium: 9.1 mg/dL (ref 8.4–10.5)
Chloride: 105 mEq/L (ref 96–112)
Glucose, Bld: 83 mg/dL (ref 70–99)
Potassium: 4.2 mEq/L (ref 3.5–5.3)
Sodium: 138 mEq/L (ref 135–145)
Total Protein: 6.5 g/dL (ref 6.0–8.3)

## 2013-02-20 LAB — LIPID PANEL
Cholesterol: 150 mg/dL (ref 0–200)
LDL Cholesterol: 70 mg/dL (ref 0–99)
Triglycerides: 180 mg/dL — ABNORMAL HIGH (ref <150)

## 2013-02-20 NOTE — Progress Notes (Signed)
Quick Note:  FAXED THIS MORNING  ______

## 2013-02-20 NOTE — Progress Notes (Signed)
Patient ID: Lee Kim, male   DOB: [birth year], 67 y.o.   MRN: [id number]  Pierre, Dellarocco  Date of visit:  02/20/2013 DOB:  28-Oct-1945    Age:  67 yrs. Medical record number:   [id number] Account number:  [id number] Primary Care Provider: Sharlot Gowda C ____________________________ CURRENT DIAGNOSES  1. Abnormal EKG  2. Hypertension,Essential (Benign)  3. Obesity(BMI30-40)  4. Sleep Apnea ____________________________ ALLERGIES  NKDA ____________________________ MEDICATIONS  1. lisinopril-hydrochlorothiazide 10-12.5 mg tablet, 1 p.o. daily  2. Cialis 20 mg tablet, PRN  3. Lumigan 0.01 % drops, 1 gtt od qd  4. Flonase 50 mcg/actuation spray,suspension, PRN  5. amlodipine 5 mg tablet, 1 p.o. daily ____________________________ CHIEF COMPLAINTS  Hypertension ____________________________ HISTORY OF PRESENT ILLNESS  This very nice 67 year old male is seen at the request of Dr. Susann Givens for evaluation of an abnormal EKG. The patient has a prior history of obesity, hypertension and sleep apnea. He has had hypertension for several years and is under a lot of stress taking care of his mother who has dementia. 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  He recently had a retinal detachment and was noted to be significantly hypertensive prior to surgery. Since then he was placed on lisinopril/HCTZ by Dr. Grant Fontana but has continued to have fluctuating blood pressures. He has not had any shortness of breath and denies PND, orthopnea, syncope, palpitations, or chest pain. He had an abnormal pulse and had an EKG done that showed a right bundle branch block and a possible old septal infarction and I was asked to evaluate the patient. He has been checking his blood pressure at home and continues to have some elevation of his blood pressures. He does not get much in the way of regular exercise. ___________________________ PAST HISTORY  Past Medical  Illnesses:  hypertension, sleep apnea, erectile dysfunction, obesity;  Cardiovascular Illnesses:  no previous history of cardiac disease.;  Surgical Procedures:  cataract extraction OD, detached retina;  Cardiology Procedures-Invasive:  no history of prior cardiac procedures;  Cardiology Procedures-Noninvasive:  no previous non-invasive procedures;  LVEF not documented,   ____________________________ CARDIO-PULMONARY TEST DATES EKG Date:  02/20/2013;   ____________________________ FAMILY HISTORY Father - age 72, died of CHF; Mother - age 63,  alive and well, breast cancer and dementia; Brother 1 - age 87, murdered; Sister 1 - age 50,  alive and well; Sister 2 - age 71,  alive and well;  ____________________________ SOCIAL HISTORY Alcohol Use:  wine 1-2 per day;  Smoking:  used to smoke but quit, 1 month ago;  Diet:  regular diet and vegetarian;  Lifestyle:  married and 1 son;  Exercise:  no regular exercise;  Occupation:  retired and Medical illustrator for [employer];  Residence:  lives with wife;   ____________________________ REVIEW OF SYSTEMS General:  obesity  Integumentary:no rashes or new skin lesions. Eyes: see HPI Ears, Nose, Throat, Mouth:  turbinate problem Respiratory: denies dyspnea, cough, wheezing or hemoptysis. Cardiovascular:  please review HPI Abdominal: denies dyspepsia, GI bleeding, constipation, or diarrhea Genitourinary-Male: erectile dysfunction  Musculoskeletal:  denies arthritis, venous insufficiency, or muscle weakness. Neurological:  denies headaches, stroke, or TIA Psychiatric:  situational stress  ____________________________ PHYSICAL EXAMINATION VITAL SIGNS  Blood Pressure:  148/80 Sitting, Left arm, large cuff  , 150/80 Standing, Left arm and large cuff   Pulse:  78/min. Weight:  293.00 lbs. Height:  73"BMI: 38  Constitutional:  pleasant white male in no  acute distress, severely obese Skin:  warm and dry to touch, no apparent skin lesions, or masses noted. Head:   normocephalic, normal hair pattern, no masses or tenderness Eyes:  EOMS Intact, PERRLA, C and S clear, Funduscopic exam not done. ENT:  ears, nose and throat reveal no gross abnormalities.  Dentition good. Neck:  supple, without massess. No JVD, thyromegaly or carotid bruits. Carotid upstroke normal. Chest:  normal symmetry, clear to auscultation and percussion. Cardiac:  regular rhythm, normal S1 and S2, No S3 or S4, no murmurs, gallops or rubs detected. Abdomen:  abdomen soft,non-tender, no masses, no hepatospenomegaly, or aneurysm noted Peripheral Pulses:  the femoral,dorsalis pedis, and posterior tibial pulses are full and equal bilaterally with no bruits auscultated. Extremities & Back:  no deformities, clubbing, cyanosis, erythema or edema observed. Normal muscle strength and tone. Neurological:  no gross motor or sensory deficits noted, affect appropriate, oriented x3. ____________________________ MOST RECENT LIPID PANEL 02/19/13  CHOL TOTL 150 mg/dl, LDL 70 calc, HDL 44 mg/dl, TRIGLYCER 010 mg/dl and CHOL/HDL 3.4 (Calc) ____________________________ IMPRESSIONS/PLAN  1. Abnormal EKG with a possible old anteroseptal infarction versus conduction delay 2. Hypertensive heart disease with elevation of blood pressure still today 3. Sleep apnea 4. Obesity 5. Recent retinal detachment  Recommendations:  His blood pressure has remained somewhat elevated and he has been concerned about the elevations of blood pressure. He is supposed to have repeat surgery for his on my coming up this summer. I have recommended that he have a Lexiscan Cardiolite study since he cannot exercise well. We talked the importance of lifestyle modifications for him.  In addition I added amlodipine 5 mg daily to his regimen. He will continue to monitor his blood pressures at home. ____________________________ TODAYS ORDERS  1. Lexiscan 1 day: First Available  2. 12 Lead EKG: Today                        ____________________________ Cardiology Physician:  Darden Palmer MD [facility]

## 2013-02-22 ENCOUNTER — Ambulatory Visit: Payer: BC Managed Care – PPO | Admitting: Family Medicine

## 2013-03-08 ENCOUNTER — Encounter: Payer: Self-pay | Admitting: Family Medicine

## 2013-03-19 ENCOUNTER — Other Ambulatory Visit: Payer: Self-pay | Admitting: Family Medicine

## 2013-03-29 ENCOUNTER — Encounter: Payer: Self-pay | Admitting: Family Medicine

## 2013-03-29 ENCOUNTER — Ambulatory Visit (INDEPENDENT_AMBULATORY_CARE_PROVIDER_SITE_OTHER): Payer: BC Managed Care – PPO | Admitting: Family Medicine

## 2013-03-29 VITALS — BP 138/88 | HR 62 | Wt 292.0 lb

## 2013-03-29 DIAGNOSIS — E669 Obesity, unspecified: Secondary | ICD-10-CM

## 2013-03-29 DIAGNOSIS — I10 Essential (primary) hypertension: Secondary | ICD-10-CM

## 2013-03-29 MED ORDER — AMLODIPINE BESYLATE 10 MG PO TABS: 10.0000 mg | ORAL_TABLET | Freq: Every day | ORAL | Status: DC

## 2013-03-29 NOTE — Progress Notes (Signed)
  Subjective:    Patient ID: MOUSTAFA MOSSA, male    DOB: [birth year], 67 y.o.   MRN: [id number]  HPI She is here for a recheck. He has made some dietary changes and is now walking roughly 20 minutes every day. He has lost a few pounds. He was recently seen by Dr. Donnie Aho and the evaluation was negative except for some wall thickening secondary to his hypertension. He was placed on amlodipine by Dr. Donnie Aho. He is having no other concerns other than his blood pressure. He does tend to check quite frequently.   Review of Systems     Objective:   Physical Exam Alert and in no distress. Blood pressure and weight are recorded.       Assessment & Plan:  OBESITY  HYPERTENSION - Plan: amLODipine (NORVASC) 10 MG tablet  his blood pressure machine does register higher than ours in the office. I explained this to him. Recommended he back off on checking his blood pressure that often. Explained to him that he is not truly have heart disease but his blood pressure is the main issue. Discussed blood pressure, way, sleep apnea with him. Strongly encouraged him to continue with his diet and exercise regimen. He will have his blood pressure checked by Dr. Donnie Aho in roughly one month.

## 2013-03-29 NOTE — Patient Instructions (Signed)
150 minutes a week of some physical activity. Continue to make dietary changes especially with carbohydrates and the best way to remember that his white food

## 2013-04-20 ENCOUNTER — Encounter: Payer: Self-pay | Admitting: Cardiology

## 2013-04-20 NOTE — Progress Notes (Signed)
Patient ID: Lee Kim, male   DOB: 27-Oct-1945, 67 y.o.   MRN: [id number]   Lee Kim, Lee Kim  Date of visit:  04/20/2013 DOB:  03-27-1946    Age:  67 yrs. Medical record number:  [id number]     Account number:  [id number] Primary Care Provider: Sharlot Gowda C ____________________________ CURRENT DIAGNOSES  1. Hypertensive Heart Disease Nos  2. Obesity(BMI30-40)  3. Sleep Apnea  4. Conduction Disorder-Right Bundle Branch Block ____________________________ ALLERGIES  NKDA ____________________________ MEDICATIONS  1. lisinopril-hydrochlorothiazide 10-12.5 mg tablet, 1 p.o. daily  2. Cialis 20 mg tablet, PRN  3. Lumigan 0.01 % drops, 1 gtt od qd  4. Flonase 50 mcg/actuation spray,suspension, PRN  5. amlodipine 10 mg tablet, 1 p.o. daily ____________________________ CHIEF COMPLAINTS  Followup of Hypertensive heart disease and abnormal EKG ____________________________ HISTORY OF PRESENT ILLNESS  The patient seen for cardiac followup. He has been feeling well since he was here and has been able to lose 7 pounds of weight. He had a nuclear stress study that showed a suggestion of a previous inferior infarction but no ischemia but he also has significant diaphragmatic attenuation. A followup echocardiogram showed preserved LV systolic function with an ejection fraction of 60% and no evidence of a previous myocardial infarction. He had a mildly dilated aorta and also had moderate left ventricular hypertrophy. The patient feels good at this time and is not having significant dyspnea. He has a right bundle branch block on EKG. ____________________________ PAST HISTORY  Past Medical Illnesses:  hypertension, sleep apnea, erectile dysfunction, obesity;  Cardiovascular Illnesses:  hypertensive heart disease;  Surgical Procedures:  cataract extraction OD, detached retina;  Cardiology Procedures-Invasive:  no previous interventional or invasive cardiology procedures;  Cardiology  Procedures-Noninvasive:  Lexiscan Myoview May 2014, echocardiogram June 2014;  LVEF of 60% documented via echocardiogram on 03/14/2013,   ____________________________ CARDIO-PULMONARY TEST DATES EKG Date:  02/20/2013;  Nuclear Study Date:  03/01/2013;  Echocardiography Date: 03/14/2013;   ____________________________ SOCIAL HISTORY Alcohol Use:  wine 1-2 per day;  Smoking:  used to smoke but quit, 1 month ago;  Diet:  regular diet and vegetarian;  Lifestyle:  married and 1 son;  Exercise:  no regular exercise;  Occupation:  retired and Medical illustrator for [employer];  Residence:  lives with wife;   ____________________________ REVIEW OF SYSTEMS General:  obesity, weight loss of 7 pounds  Eyes: see HPI Ears, Nose, Throat, Mouth:  turbinate problem Respiratory: denies dyspnea, cough, wheezing or hemoptysis. Cardiovascular:  please review HPI  Genitourinary-Male: erectile dysfunction   Psychiatric:  situational stress  ____________________________ PHYSICAL EXAMINATION VITAL SIGNS  Blood Pressure:  134/80 Sitting, Left arm, large cuff   Pulse:  76/min. Weight:  286.00 lbs. Height:  73"BMI: 37  Constitutional:  pleasant white male in no acute distress, severely obese Skin:  warm and dry to touch, no apparent skin lesions, or masses noted. Head:  normocephalic, normal hair pattern, no masses or tenderness Neck:  supple, without massess. No JVD, thyromegaly or carotid bruits. Carotid upstroke normal. Chest:  normal symmetry, clear to auscultation and percussion. Cardiac:  regular rhythm, normal S1 and S2, No S3 or S4, no murmurs, gallops or rubs detected. Peripheral Pulses:  the femoral,dorsalis pedis, and posterior tibial pulses are full and equal bilaterally with no bruits auscultated. Extremities & Back:  no deformities, clubbing, cyanosis, erythema or edema observed. Normal muscle strength and tone. Neurological:  no gross motor or sensory deficits noted, affect appropriate, oriented  x3. ____________________________ MOST RECENT LIPID  PANEL 02/19/13  CHOL TOTL 150 mg/dl, LDL 70 calc, HDL 44 mg/dl, TRIGLYCER 161 mg/dl and CHOL/HDL 3.4 (Calc) ____________________________ IMPRESSIONS/PLAN   1. Hypertensive heart disease with no evidence of a previous myocardial infarction 2. Significant obesity 3. Right bundle branch block  Recommendations:  I think the inferior defect on his nuclear study was related to his significant obesity and diaphragmatic attenuation in light of the normal ejection fraction on the echocardiogram. His wall thickness is moderately increased. He has evidence of hypertensive heart disease and would benefit from weight loss as well as excellent blood pressure control to try to promote progression in the future. He had questions about the use of aspirin and I suggested aspirin 81 mg every other day. He has no ischia noted. We also talked about pros and cons of taking visual. I suggested that he set a target weight of 220 and that he make a goal to try to get down to 250 pounds with increasing his exercise as well as limiting his carbohydrates. He does have a mildly dilated aorta and I would like to see him in follow up again in one year with a repeat echo to look at that as well as the wall thickness. Thank you for asking me to see this nice man with you. ____________________________ TODAYS ORDERS  1. Return Visit: 1 year  2. 2D, color flow, doppler: 1 year                       ____________________________ Cardiology Physician:  Darden Palmer. MD [facility]

## 2013-05-16 ENCOUNTER — Other Ambulatory Visit: Payer: Self-pay | Admitting: Family Medicine

## 2013-07-19 ENCOUNTER — Encounter: Payer: Self-pay | Admitting: Internal Medicine

## 2013-10-01 ENCOUNTER — Ambulatory Visit: Payer: BC Managed Care – PPO | Admitting: Family Medicine

## 2013-11-08 ENCOUNTER — Encounter: Payer: Self-pay | Admitting: Family Medicine

## 2013-11-08 ENCOUNTER — Ambulatory Visit (INDEPENDENT_AMBULATORY_CARE_PROVIDER_SITE_OTHER): Payer: BC Managed Care – PPO | Admitting: Family Medicine

## 2013-11-08 VITALS — BP 138/82 | HR 76 | Wt 289.0 lb

## 2013-11-08 DIAGNOSIS — L259 Unspecified contact dermatitis, unspecified cause: Secondary | ICD-10-CM

## 2013-11-08 DIAGNOSIS — J309 Allergic rhinitis, unspecified: Secondary | ICD-10-CM

## 2013-11-08 DIAGNOSIS — I10 Essential (primary) hypertension: Secondary | ICD-10-CM

## 2013-11-08 DIAGNOSIS — N529 Male erectile dysfunction, unspecified: Secondary | ICD-10-CM

## 2013-11-08 DIAGNOSIS — N410 Acute prostatitis: Secondary | ICD-10-CM

## 2013-11-08 DIAGNOSIS — L309 Dermatitis, unspecified: Secondary | ICD-10-CM

## 2013-11-08 DIAGNOSIS — E669 Obesity, unspecified: Secondary | ICD-10-CM

## 2013-11-08 MED ORDER — LISINOPRIL-HYDROCHLOROTHIAZIDE 10-12.5 MG PO TABS: ORAL_TABLET | ORAL | Status: DC

## 2013-11-08 MED ORDER — TADALAFIL 20 MG PO TABS: ORAL_TABLET | ORAL | Status: DC

## 2013-11-08 MED ORDER — CIPROFLOXACIN HCL 500 MG PO TABS: 500.0000 mg | ORAL_TABLET | Freq: Two times a day (BID) | ORAL | Status: DC

## 2013-11-08 MED ORDER — FLUTICASONE PROPIONATE 50 MCG/ACT NA SUSP: 2.0000 | Freq: Every day | NASAL | Status: DC

## 2013-11-08 MED ORDER — AMLODIPINE BESYLATE 10 MG PO TABS: 10.0000 mg | ORAL_TABLET | Freq: Every day | ORAL | Status: DC

## 2013-11-08 NOTE — Progress Notes (Signed)
   Subjective:    Patient ID: Lee Kim, male    DOB: 22-Mar-1946, 68 y.o.   MRN: [id number]  HPI Has a three-month history of left testicular and scrotal discomfort but no frequency, urgency, back or abdominal pain. No discharge. He also has a rash in the inner thigh area that he thinks is related to his white using a lubricant. He has had recent evaluation by cardiology and was placed on a new blood pressure medication. He does have underlying allergies and is using Flonase regularly and would like a refill. He also has ED and responds well to Cialis. He would like this also renewed. He has been working on his weight is some dietary modification but is unable to exercise like he would like because of having to take care of his mother   Review of Systems     Objective:   Physical Exam Alert and in no distress. Exam of the testes shows a left chest CT be slightly smaller core no lesions were palpable. The epididymis is normal. No hernia noted. Rectal exam shows a normal sized prostate that is nontender and no nodules appreciated. Exam of the skin on the inner thigh does show slight erythema and dryness.       Assessment & Plan:  Prostatitis, acute - Plan: ciprofloxacin (CIPRO) 500 MG tablet, Fecal Occult Blood, Guaiac  Dermatitis  HYPERTENSION - Plan: amLODipine (NORVASC) 10 MG tablet, lisinopril-hydrochlorothiazide (PRINZIDE,ZESTORETIC) 10-12.5 MG per tablet  OBESITY  Allergic rhinitis - Plan: fluticasone (FLONASE) 50 MCG/ACT nasal spray  ED (erectile dysfunction) - Plan: tadalafil (CIALIS) 20 MG tablet  although his exam was essentially negative, I will treat him for prostate infection and recheck this in 2 weeks. He is to use cortisone cream and we'll also reevaluate the dermatitis in 2 weeks. Encouraged him to increase his physical activity even if it's small increments of 5 or 10 minutes at a time.

## 2013-11-08 NOTE — Patient Instructions (Signed)
Use an over-the-counter cortisone cream for the rash. Try to increase your physical activities even if it's only 5 or 10 minutes at a time.

## 2013-11-17 ENCOUNTER — Other Ambulatory Visit: Payer: Self-pay | Admitting: Family Medicine

## 2013-11-21 ENCOUNTER — Encounter: Payer: Self-pay | Admitting: Family Medicine

## 2013-11-21 ENCOUNTER — Ambulatory Visit: Payer: BC Managed Care – PPO | Admitting: Family Medicine

## 2013-11-21 ENCOUNTER — Ambulatory Visit (INDEPENDENT_AMBULATORY_CARE_PROVIDER_SITE_OTHER): Payer: BC Managed Care – PPO | Admitting: Family Medicine

## 2013-11-21 VITALS — BP 128/80 | HR 60

## 2013-11-21 DIAGNOSIS — N410 Acute prostatitis: Secondary | ICD-10-CM

## 2013-11-21 LAB — POCT URINALYSIS DIPSTICK
Bilirubin, UA: NEGATIVE
Blood, UA: NEGATIVE
Glucose, UA: NEGATIVE
KETONES UA: NEGATIVE
Leukocytes, UA: NEGATIVE
Nitrite, UA: NEGATIVE
PROTEIN UA: NEGATIVE
Spec Grav, UA: <=1.005
Urobilinogen, UA: NEGATIVE
pH, UA: 7

## 2013-11-21 MED ORDER — CIPROFLOXACIN HCL 500 MG PO TABS: 500.0000 mg | ORAL_TABLET | Freq: Two times a day (BID) | ORAL | Status: DC

## 2013-11-21 NOTE — Progress Notes (Signed)
   Subjective:    Patient ID: Lee Kim, male    DOB: 09-May-1946, 68 y.o.   MRN: [id number]  HPI He is here for a recheck. He states that towards the end of the antibiotic he was feeling much better and is now noticing a slight recurrence of his symptoms. Also he has not been using the steroid cream regularly.   Review of Systems     Objective:   Physical Exam Alert and in no distress. Urine dipstick today was negative. Previous dipstick was not reported but was also negative. He did not want me to evaluate his dermatitis.       Assessment & Plan:  Prostatitis, acute - Plan: ciprofloxacin (CIPRO) 500 MG tablet  he will call if he is not totally better when he faces this round of antibiotic. Encouraged him to use the steroid cream regularly for the next minimum of 2 weeks.

## 2013-11-30 ENCOUNTER — Telehealth: Payer: Self-pay | Admitting: Family Medicine

## 2013-11-30 DIAGNOSIS — N410 Acute prostatitis: Secondary | ICD-10-CM

## 2013-11-30 MED ORDER — CIPROFLOXACIN HCL 500 MG PO TABS: 500.0000 mg | ORAL_TABLET | Freq: Two times a day (BID) | ORAL | Status: DC

## 2013-11-30 NOTE — Telephone Encounter (Signed)
Patient called stating he is much better but would like a refill on the antibiotic

## 2013-12-18 ENCOUNTER — Ambulatory Visit (INDEPENDENT_AMBULATORY_CARE_PROVIDER_SITE_OTHER): Payer: BC Managed Care – PPO | Admitting: Family Medicine

## 2013-12-18 ENCOUNTER — Encounter: Payer: Self-pay | Admitting: Family Medicine

## 2013-12-18 VITALS — BP 100/74 | HR 60

## 2013-12-18 DIAGNOSIS — N410 Acute prostatitis: Secondary | ICD-10-CM

## 2013-12-18 MED ORDER — SULFAMETHOXAZOLE-TMP DS 800-160 MG PO TABS: 1.0000 | ORAL_TABLET | Freq: Two times a day (BID) | ORAL | Status: DC

## 2013-12-18 NOTE — Progress Notes (Signed)
   Subjective:    Patient ID: Lee Kim, male    DOB: 1945-10-20, 68 y.o.   MRN: [id number]  HPI He is here for a recheck. He states that the left testicular pain is now gone and now he is having some left lower abdominal discomfort but much better than in the past. No urgency, frequency or discharge.   Review of Systems     Objective:   Physical Exam Alert and in no distress otherwise not examined       Assessment & Plan:  Prostatitis, acute - Plan: sulfamethoxazole-trimethoprim (BACTRIM DS) 800-160 MG per tablet  I discussed urology referral since she's not totally over this however he would like to hold off on that and try a different antibiotic. I will switch him to Septra and have him use Aleve 2 twice per day. If this does not resolve the symptoms, I will refer him to urology.

## 2013-12-18 NOTE — Patient Instructions (Signed)
Take the antibiotic and also take 2 Aleve twice per day

## 2014-01-22 ENCOUNTER — Ambulatory Visit (INDEPENDENT_AMBULATORY_CARE_PROVIDER_SITE_OTHER): Payer: BC Managed Care – PPO | Admitting: Family Medicine

## 2014-01-22 ENCOUNTER — Encounter: Payer: Self-pay | Admitting: Family Medicine

## 2014-01-22 VITALS — BP 110/72 | HR 56

## 2014-01-22 DIAGNOSIS — N410 Acute prostatitis: Secondary | ICD-10-CM

## 2014-01-22 MED ORDER — SULFAMETHOXAZOLE-TMP DS 800-160 MG PO TABS: 1.0000 | ORAL_TABLET | Freq: Two times a day (BID) | ORAL | Status: DC

## 2014-01-22 NOTE — Patient Instructions (Signed)
Take the antibiotic and the Aleve regularly the next month and if there is any question let me know

## 2014-01-22 NOTE — Progress Notes (Signed)
   Subjective:    Patient ID: Lee Kim, male    DOB: 13-Dec-1945, 68 y.o.   MRN: [id number]  HPI He is here for recheck. He states that he is much better stating he is roughly 80% back to normal. He still has concerns over some left sided testicular discomfort is supposed to when he started when it was quite painful. He has concerns over being completely cured.   Review of Systems     Objective:   Physical Exam Alert and in no distress otherwise not examined       Assessment & Plan:  Prostatitis, acute - Plan: sulfamethoxazole-trimethoprim (BACTRIM DS) 800-160 MG per tablet  I will place him on a medication for one month. He is also to continue on Aleve to help with the inflammatory component. If he still having difficulty at that time, I will discuss possible referral with him.

## 2014-02-26 ENCOUNTER — Telehealth: Payer: Self-pay | Admitting: Family Medicine

## 2014-02-26 DIAGNOSIS — N410 Acute prostatitis: Secondary | ICD-10-CM

## 2014-02-26 MED ORDER — SULFAMETHOXAZOLE-TMP DS 800-160 MG PO TABS: 1.0000 | ORAL_TABLET | Freq: Two times a day (BID) | ORAL | Status: DC

## 2014-02-26 NOTE — Telephone Encounter (Signed)
Apparently his prostate symptoms have recurred. I will give him one more month of the medication however if he continues to have difficulty, I will refer to urology.

## 2014-02-26 NOTE — Telephone Encounter (Signed)
Pt states he has been in several times for prostatitis. He states that the last medication he was placed on helped a little but now symptoms have returned. He staes that at last office visit you informed him if that was the case to just call in and you would give him medication. Pt uses [facility].

## 2014-03-25 ENCOUNTER — Telehealth: Payer: Self-pay | Admitting: Family Medicine

## 2014-03-25 DIAGNOSIS — N410 Acute prostatitis: Secondary | ICD-10-CM

## 2014-03-25 NOTE — Telephone Encounter (Signed)
Pt states that he would like something to take so his symptoms won't come back before he get goes to see the urologist. Send to [facility]

## 2014-03-25 NOTE — Telephone Encounter (Signed)
Pt called and stated that his symptoms have returned. He states that maybe its time to try something else. Pt was informed that he may need an appt to discuss this with you. Pt can be reached at [phone] and uses [facility].

## 2014-03-25 NOTE — Telephone Encounter (Signed)
Tell him it's time for referral and set him up to see urology. Send my last 2 notes

## 2014-03-25 NOTE — Telephone Encounter (Signed)
Pt scheduled to see Dr. Louis Meckel at [facility] on 04/11/14 at 10:00am

## 2014-03-26 MED ORDER — SULFAMETHOXAZOLE-TMP DS 800-160 MG PO TABS: 1.0000 | ORAL_TABLET | Freq: Two times a day (BID) | ORAL | Status: DC

## 2014-06-21 ENCOUNTER — Encounter: Payer: Self-pay | Admitting: Internal Medicine

## 2014-11-09 ENCOUNTER — Other Ambulatory Visit: Payer: Self-pay | Admitting: Family Medicine

## 2014-11-14 ENCOUNTER — Telehealth: Payer: Self-pay | Admitting: Family Medicine

## 2014-11-14 DIAGNOSIS — N529 Male erectile dysfunction, unspecified: Secondary | ICD-10-CM

## 2014-11-14 DIAGNOSIS — I10 Essential (primary) hypertension: Secondary | ICD-10-CM

## 2014-11-14 MED ORDER — AMLODIPINE BESYLATE 10 MG PO TABS: 10.0000 mg | ORAL_TABLET | Freq: Every day | ORAL | Status: DC

## 2014-11-14 MED ORDER — LISINOPRIL-HYDROCHLOROTHIAZIDE 10-12.5 MG PO TABS: 1.0000 | ORAL_TABLET | Freq: Every day | ORAL | Status: DC

## 2014-11-14 MED ORDER — TADALAFIL 20 MG PO TABS: ORAL_TABLET | ORAL | Status: DC

## 2014-11-14 NOTE — Telephone Encounter (Signed)
Pt made CPE appt for 03/05/15. Requesting refill on Cialis 20MG , Amlodipine 10MG  and Lisinopril 10-12.5MG  to last until that appt

## 2014-11-29 ENCOUNTER — Other Ambulatory Visit: Payer: Self-pay | Admitting: Family Medicine

## 2014-11-29 NOTE — Telephone Encounter (Signed)
PATIENT NEEDS AN APPOINTMENT TO FOLLOW UP ON BP MEDICATION

## 2015-03-05 ENCOUNTER — Encounter: Payer: Self-pay | Admitting: Family Medicine

## 2015-03-05 ENCOUNTER — Ambulatory Visit (INDEPENDENT_AMBULATORY_CARE_PROVIDER_SITE_OTHER): Payer: BC Managed Care – PPO | Admitting: Family Medicine

## 2015-03-05 VITALS — BP 122/82 | HR 120 | Ht 73.0 in | Wt 298.0 lb

## 2015-03-05 DIAGNOSIS — Z0000 Encounter for general adult medical examination without abnormal findings: Secondary | ICD-10-CM

## 2015-03-05 DIAGNOSIS — I10 Essential (primary) hypertension: Secondary | ICD-10-CM | POA: Diagnosis not present

## 2015-03-05 DIAGNOSIS — D751 Secondary polycythemia: Secondary | ICD-10-CM | POA: Diagnosis not present

## 2015-03-05 DIAGNOSIS — J309 Allergic rhinitis, unspecified: Secondary | ICD-10-CM

## 2015-03-05 DIAGNOSIS — Z23 Encounter for immunization: Secondary | ICD-10-CM | POA: Diagnosis not present

## 2015-03-05 DIAGNOSIS — G4733 Obstructive sleep apnea (adult) (pediatric): Secondary | ICD-10-CM | POA: Diagnosis not present

## 2015-03-05 DIAGNOSIS — Z9989 Dependence on other enabling machines and devices: Secondary | ICD-10-CM

## 2015-03-05 DIAGNOSIS — N529 Male erectile dysfunction, unspecified: Secondary | ICD-10-CM

## 2015-03-05 DIAGNOSIS — Z6379 Other stressful life events affecting family and household: Secondary | ICD-10-CM

## 2015-03-05 LAB — LIPID PANEL
CHOL/HDL RATIO: 3.7 ratio
CHOLESTEROL: 143 mg/dL (ref 0–200)
HDL: 39 mg/dL — ABNORMAL LOW (ref >=40)
LDL CALC: 75 mg/dL (ref 0–99)
Triglycerides: 144 mg/dL (ref <150)
VLDL: 29 mg/dL (ref 0–40)

## 2015-03-05 LAB — POCT URINALYSIS DIPSTICK
BILIRUBIN UA: NEGATIVE
Blood, UA: NEGATIVE
Glucose, UA: NEGATIVE
KETONES UA: NEGATIVE
LEUKOCYTES UA: NEGATIVE
Nitrite, UA: NEGATIVE
PH UA: 6
Protein, UA: NEGATIVE
SPEC GRAV UA: 1.02
UROBILINOGEN UA: NEGATIVE

## 2015-03-05 LAB — COMPREHENSIVE METABOLIC PANEL
ALT: 21 U/L (ref 0–53)
AST: 20 U/L (ref 0–37)
Albumin: 4.1 g/dL (ref 3.5–5.2)
Alkaline Phosphatase: 60 U/L (ref 39–117)
BUN: 21 mg/dL (ref 6–23)
CO2: 25 mEq/L (ref 19–32)
Calcium: 9 mg/dL (ref 8.4–10.5)
Chloride: 103 mEq/L (ref 96–112)
Creat: 1.19 mg/dL (ref 0.50–1.35)
GLUCOSE: 103 mg/dL — AB (ref 70–99)
POTASSIUM: 4 meq/L (ref 3.5–5.3)
Sodium: 138 mEq/L (ref 135–145)
Total Bilirubin: 0.8 mg/dL (ref 0.2–1.2)
Total Protein: 6.4 g/dL (ref 6.0–8.3)

## 2015-03-05 LAB — CBC WITH DIFFERENTIAL/PLATELET
BASOS PCT: 1 % (ref 0–1)
Basophils Absolute: 0.1 10*3/uL (ref 0.0–0.1)
Eosinophils Absolute: 0.3 10*3/uL (ref 0.0–0.7)
Eosinophils Relative: 3 % (ref 0–5)
HCT: 47.7 % (ref 39.0–52.0)
Hemoglobin: 16.8 g/dL (ref 13.0–17.0)
LYMPHS PCT: 20 % (ref 12–46)
Lymphs Abs: 1.8 10*3/uL (ref 0.7–4.0)
MCH: 32.2 pg (ref 26.0–34.0)
MCHC: 35.2 g/dL (ref 30.0–36.0)
MCV: 91.6 fL (ref 78.0–100.0)
MONO ABS: 1.1 10*3/uL — AB (ref 0.1–1.0)
MPV: 11.9 fL (ref 8.6–12.4)
Monocytes Relative: 12 % (ref 3–12)
NEUTROS ABS: 5.7 10*3/uL (ref 1.7–7.7)
Neutrophils Relative %: 64 % (ref 43–77)
Platelets: 183 10*3/uL (ref 150–400)
RBC: 5.21 MIL/uL (ref 4.22–5.81)
RDW: 13.9 % (ref 11.5–15.5)
WBC: 8.9 10*3/uL (ref 4.0–10.5)

## 2015-03-05 MED ORDER — AMLODIPINE BESYLATE 10 MG PO TABS: 10.0000 mg | ORAL_TABLET | Freq: Every day | ORAL | Status: DC

## 2015-03-05 MED ORDER — TADALAFIL 20 MG PO TABS: ORAL_TABLET | ORAL | Status: DC

## 2015-03-05 MED ORDER — BUDESONIDE 32 MCG/ACT NA SUSP: 2.0000 | Freq: Every day | NASAL | Status: DC

## 2015-03-05 NOTE — Progress Notes (Signed)
Subjective:    Patient ID: Lee Kim, male    DOB: [birth year], 69 y.o.   MRN: [id number]  HPI He is here for complete examination.He has had recent difficulty with his right eye and is apparently seeing 2 different ophthalmologists for this. He continues have difficulty with his prostate and is following up with urology concerning this. He is also getting physical therapy for this. Apparently now they think it's more of a muscular issue. He was diagnosed with OSA and presently is on CPAP. He is having some difficulty with this due to nasal congestion. He has seen ENT and surgery was recommended however he is very reluctant to have this. He also is been having some difficulty with itching in the inguinal area. He relates this to recently doing a fair amount of walking. He has been under a lot of stress due to helping to take care of his mother who is now in a nursing home and apparently has Alzheimer's disease. He has a history of polycythemia which is apparently secondary to his sleep apnea. He continues to do well on his present medications including Cialis. Social and family history as well as immunizations and health maintenance was reviewed. He does have an advanced directive.   Review of Systems  All other systems reviewed and are negative.      Objective:   Physical Exam BP 122/82 mmHg  Pulse 120  Ht 6\' 1"  (1.854 m)  Wt 298 lb (135.172 kg)  BMI 39.32 kg/m2  SpO2 95%  General Appearance:    Alert, cooperative, no distress, appears stated age  Head:    Normocephalic, without obvious abnormality, atraumatic  Eyes:    PERRL, conjunctiva/corneas clear, EOM's intact, fundi    benign  Ears:    Normal TM's and external ear canals  Nose:   Nares normal, mucosa normal, no drainage or sinus   tenderness  Throat:   Lips, mucosa, and tongue normal; teeth and gums normal  Neck:   Supple, no lymphadenopathy;  thyroid:  no   enlargement/tenderness/nodules; no carotid   bruit or JVD    Back:    Spine nontender, no curvature, ROM normal, no CVA     tenderness  Lungs:     Clear to auscultation bilaterally without wheezes, rales or     ronchi; respirations unlabored  Chest Wall:    No tenderness or deformity   Heart:    Regular rate and rhythm, S1 and S2 normal, no murmur, rub   or gallop  Breast Exam:    No chest wall tenderness, masses or gynecomastia  Abdomen:     Soft, non-tender, nondistended, normoactive bowel sounds,    no masses, no hepatosplenomegaly        Extremities:   No clubbing, cyanosis or edema  Pulses:   2+ and symmetric all extremities  Skin:   Skin color, texture, turgor normal, Slight erythema is noted on the inner aspect of both thighs.  Lymph nodes:   Cervical, supraclavicular, and axillary nodes normal  Neurologic:   CNII-XII intact, normal strength, sensation and gait; reflexes 2+ and symmetric throughout          Psych:   Normal mood, affect, hygiene and grooming.          Assessment & Plan:  Routine general medical examination at a health care facility - Plan: POCT Urinalysis Dipstick, CBC with Differential/Platelet, Comprehensive metabolic panel, Lipid panel  Need for prophylactic vaccination against Streptococcus pneumoniae (pneumococcus) - Plan: Pneumococcal  conjugate vaccine 13-valent  Allergic rhinitis, mild - Plan: budesonide (RHINOCORT AQUA) 32 MCG/ACT nasal spray  Erectile dysfunction, unspecified erectile dysfunction type - Plan: tadalafil (CIALIS) 20 MG tablet  Essential hypertension - Plan: amLODipine (NORVASC) 10 MG tablet, CBC with Differential/Platelet, Comprehensive metabolic panel  Stress due to illness of family member  OSA on CPAP  Polycythemia, secondary He will continue on his present medications. Discussed possible referral to an allergist but strongly encouraged him to use nasal stair right spray especially at night before he puts his CPAP on. Also recommended a good multivitamin. He will follow-up with his  ophthalmologist as well as urologist. For the inguinal area, I recommended that he wear boxer weeks to help put some material between his inner thighs.

## 2015-03-11 ENCOUNTER — Telehealth: Payer: Self-pay | Admitting: Family Medicine

## 2015-03-11 NOTE — Telephone Encounter (Signed)
Initiated P.A. RHINOCORT, insurance requires trial of alternative drugs  FLUTICASONE PROP NASAL SPRAY 50MCG, FLUNISOLIDE NS SPR 0.025%25ML 25MCG or FLUTICASONE PROP NASAL SPRAY 50MCG Pt has tried both.

## 2015-04-01 NOTE — Telephone Encounter (Signed)
P.A. Approved per Cover My Meds, called pharmacy & pt already picked up

## 2015-04-16 ENCOUNTER — Other Ambulatory Visit: Payer: Self-pay | Admitting: Family Medicine

## 2015-05-12 ENCOUNTER — Other Ambulatory Visit: Payer: Self-pay | Admitting: Family Medicine

## 2015-11-25 ENCOUNTER — Telehealth: Payer: Self-pay | Admitting: Family Medicine

## 2015-11-25 DIAGNOSIS — I10 Essential (primary) hypertension: Secondary | ICD-10-CM

## 2015-11-25 MED ORDER — AMLODIPINE BESYLATE 10 MG PO TABS: 10.0000 mg | ORAL_TABLET | Freq: Every day | ORAL | Status: DC

## 2015-11-25 NOTE — Telephone Encounter (Signed)
Amlodipine refilled to [facility].  

## 2015-11-25 NOTE — Telephone Encounter (Signed)
[facility]AC[facility] /pt called and has a new pharmacy for all meds. He now uses [facility] on [address]. Right now he only needs Amlodipine filled.

## 2015-11-26 ENCOUNTER — Telehealth: Payer: Self-pay | Admitting: Family Medicine

## 2015-11-26 ENCOUNTER — Other Ambulatory Visit: Payer: Self-pay | Admitting: Family Medicine

## 2015-11-26 DIAGNOSIS — N529 Male erectile dysfunction, unspecified: Secondary | ICD-10-CM

## 2015-11-26 DIAGNOSIS — J309 Allergic rhinitis, unspecified: Secondary | ICD-10-CM

## 2015-11-26 DIAGNOSIS — I10 Essential (primary) hypertension: Secondary | ICD-10-CM

## 2015-11-26 MED ORDER — AMLODIPINE BESYLATE 10 MG PO TABS: 10.0000 mg | ORAL_TABLET | Freq: Every day | ORAL | Status: DC

## 2015-11-26 MED ORDER — LISINOPRIL-HYDROCHLOROTHIAZIDE 10-12.5 MG PO TABS: 1.0000 | ORAL_TABLET | Freq: Every day | ORAL | Status: DC

## 2015-11-26 MED ORDER — BUDESONIDE 32 MCG/ACT NA SUSP: 2.0000 | Freq: Every day | NASAL | Status: DC

## 2015-11-26 MED ORDER — TADALAFIL 20 MG PO TABS: ORAL_TABLET | ORAL | Status: DC

## 2015-11-26 NOTE — Telephone Encounter (Signed)
Pt called and states all his meds were to be transferred to the [facility] [address] at [facility].  So I went over with him which ones.  Amlodipine, Rhinocort, Lisinopril and Cialis.  I gave him his remaining refills to the [facility].

## 2015-12-29 ENCOUNTER — Other Ambulatory Visit: Payer: Self-pay | Admitting: Family Medicine

## 2016-05-11 ENCOUNTER — Other Ambulatory Visit: Payer: Self-pay

## 2016-05-11 ENCOUNTER — Telehealth: Payer: Self-pay | Admitting: Family Medicine

## 2016-05-11 DIAGNOSIS — I10 Essential (primary) hypertension: Secondary | ICD-10-CM

## 2016-05-11 MED ORDER — LISINOPRIL-HYDROCHLOROTHIAZIDE 10-12.5 MG PO TABS: 1.0000 | ORAL_TABLET | Freq: Every day | ORAL | 0 refills | Status: DC

## 2016-05-11 MED ORDER — AMLODIPINE BESYLATE 10 MG PO TABS: 10.0000 mg | ORAL_TABLET | Freq: Every day | ORAL | 0 refills | Status: DC

## 2016-05-11 NOTE — Telephone Encounter (Signed)
Sent meds in

## 2016-05-11 NOTE — Telephone Encounter (Signed)
Pt made CPE appt for 11/8. Requesting 90 day refill on Amlodipine 10 mg & Lisinopril 10-12.5 mg

## 2016-07-01 ENCOUNTER — Ambulatory Visit (INDEPENDENT_AMBULATORY_CARE_PROVIDER_SITE_OTHER): Payer: Medicare HMO | Admitting: Family Medicine

## 2016-07-01 ENCOUNTER — Encounter: Payer: Self-pay | Admitting: Family Medicine

## 2016-07-01 VITALS — BP 130/80 | HR 84 | Wt 294.0 lb

## 2016-07-01 DIAGNOSIS — K645 Perianal venous thrombosis: Secondary | ICD-10-CM | POA: Diagnosis not present

## 2016-07-01 DIAGNOSIS — Z23 Encounter for immunization: Secondary | ICD-10-CM

## 2016-07-01 NOTE — Progress Notes (Signed)
   Subjective:    Patient ID: Lee Kim, male    DOB: [birth year], 70 y.o.   MRN: [id number]  HPI He is here for consult concerning difficulty with seeing bright red blood in his stool and some rectal discomfort over the last several days. He does have a previous history of colonic polyps but has not had a colonoscopy in at least 10 years. He was seen in the past by Dr. Wynetta Emery but has not heard from that office.   Review of Systems     Objective:   Physical Exam Alert and in no distress. Exam of the anal area does show a thrombosed hemorrhoid that is eroded. Thrombosed tissue was able to be manually removed without difficulty. No other lesions were noted.       Assessment & Plan:  Need for prophylactic vaccination and inoculation against influenza - Plan: Flu vaccine HIGH DOSE PF (Fluzone High dose)  Thrombosed external hemorrhoid Recommend warm tub baths or directing a current of water on to this area to help soothe it. He will continue to use MiraLAX to help with his stool. We will follow-up with Dr. Wynetta Emery concerning follow-up colonoscopy.

## 2016-08-09 ENCOUNTER — Telehealth: Payer: Self-pay | Admitting: Family Medicine

## 2016-08-09 NOTE — Telephone Encounter (Signed)
Due for refill. rx sent for 90 day with pending appt

## 2016-08-09 NOTE — Telephone Encounter (Signed)
Pt called and stated that he has a cpe this week but he is out of his bp meds. Please send in to [facility] on [address]. Pt can be reached at [phone]. This is Lisinopril-HCTZ. Please send ASAP as pt is completely out.

## 2016-08-10 ENCOUNTER — Other Ambulatory Visit: Payer: Self-pay | Admitting: Family Medicine

## 2016-08-10 MED ORDER — LISINOPRIL-HYDROCHLOROTHIAZIDE 10-12.5 MG PO TABS: 1.0000 | ORAL_TABLET | Freq: Every day | ORAL | 0 refills | Status: DC

## 2016-08-11 ENCOUNTER — Encounter: Payer: Self-pay | Admitting: Family Medicine

## 2016-08-11 ENCOUNTER — Encounter: Payer: Self-pay | Admitting: Internal Medicine

## 2016-08-11 ENCOUNTER — Other Ambulatory Visit: Payer: Self-pay | Admitting: Family Medicine

## 2016-08-11 ENCOUNTER — Ambulatory Visit (INDEPENDENT_AMBULATORY_CARE_PROVIDER_SITE_OTHER): Payer: Medicare HMO | Admitting: Family Medicine

## 2016-08-11 VITALS — BP 124/80 | HR 46 | Ht 73.0 in | Wt 292.0 lb

## 2016-08-11 DIAGNOSIS — Z86010 Personal history of colonic polyps: Secondary | ICD-10-CM

## 2016-08-11 DIAGNOSIS — N529 Male erectile dysfunction, unspecified: Secondary | ICD-10-CM | POA: Diagnosis not present

## 2016-08-11 DIAGNOSIS — I10 Essential (primary) hypertension: Secondary | ICD-10-CM

## 2016-08-11 DIAGNOSIS — E6609 Other obesity due to excess calories: Secondary | ICD-10-CM

## 2016-08-11 DIAGNOSIS — F1010 Alcohol abuse, uncomplicated: Secondary | ICD-10-CM

## 2016-08-11 DIAGNOSIS — I493 Ventricular premature depolarization: Secondary | ICD-10-CM | POA: Diagnosis not present

## 2016-08-11 DIAGNOSIS — J309 Allergic rhinitis, unspecified: Secondary | ICD-10-CM

## 2016-08-11 DIAGNOSIS — Z1159 Encounter for screening for other viral diseases: Secondary | ICD-10-CM | POA: Diagnosis not present

## 2016-08-11 DIAGNOSIS — Z6841 Body Mass Index (BMI) 40.0 and over, adult: Secondary | ICD-10-CM | POA: Diagnosis not present

## 2016-08-11 DIAGNOSIS — I4891 Unspecified atrial fibrillation: Secondary | ICD-10-CM | POA: Diagnosis not present

## 2016-08-11 DIAGNOSIS — Z6379 Other stressful life events affecting family and household: Secondary | ICD-10-CM | POA: Diagnosis not present

## 2016-08-11 DIAGNOSIS — Z9989 Dependence on other enabling machines and devices: Secondary | ICD-10-CM

## 2016-08-11 DIAGNOSIS — G4733 Obstructive sleep apnea (adult) (pediatric): Secondary | ICD-10-CM | POA: Diagnosis not present

## 2016-08-11 DIAGNOSIS — IMO0001 Reserved for inherently not codable concepts without codable children: Secondary | ICD-10-CM

## 2016-08-11 MED ORDER — LISINOPRIL-HYDROCHLOROTHIAZIDE 10-12.5 MG PO TABS: 1.0000 | ORAL_TABLET | Freq: Every day | ORAL | 3 refills | Status: DC

## 2016-08-11 MED ORDER — AMLODIPINE BESYLATE 10 MG PO TABS: 10.0000 mg | ORAL_TABLET | Freq: Every day | ORAL | 3 refills | Status: DC

## 2016-08-11 MED ORDER — TADALAFIL 20 MG PO TABS: ORAL_TABLET | ORAL | 6 refills | Status: DC

## 2016-08-11 NOTE — Progress Notes (Signed)
Subjective:   HPI  Lee Kim is a 70 y.o. male who presents for a Medication check and annual wellness visit  Medical care team includes:  [facility]   Preventative care: Last ophthalmology visit:03/16/16 Last dental visit:3 times a year 07/04/16 Last colonoscopy: 03/25/08 Last prostate exam: 2017 Last EKG:02/19/13 Last labs:03/05/15  Prior vaccinations: TD or Tdap:08/03/07 Influenza:07/01/16 Pneumococcal:23:09/20/06,04/08/08 13: 03/05/15 Shingles/Zostavax:09/10/09   Advanced directive: Yes Health care power of attorney: No Concerns: He continues on his blood pressure medication without difficulty. He also has underlying erectile dysfunction and does use Cialis. He has concerns about the cost. He continues to be followed by ophthalmology for ongoing eye issues. He has a previous history of colonic polyps with his last colonoscopy in 2009. His underlying allergies are under good control. He is using steroid nasal spray. Longer uses Flomax for his urinary symptoms. He does have OSA and is getting good results with the CPAP. He has noted difficulty with intermittent dizziness as well as occasional issues with word searching. He does complain of a slight tremor mainly on his right hand. He has had no falls or changes in his ability to walk. He does admit to being under last stress and using alcohol more than 2 drinks per night to help relax him. He is taking care of his over 25 year old mother who is in a nursing home and requiring constant care. No difficulty with chest pain, shortness breath, DOE or PND. Reviewed their medical, surgical, family, social, medication, and allergy history and updated chart as appropriate.  Review of Systems Negative except as above    Objective:   Physical Exam   General appearance: alert, no distress, WD/WN,  Skin:Normal HEENT: normocephalic, conjunctiva/corneas normal, sclerae anicteric, PERRLA, EOMi, nares patent,  no discharge or erythema, pharynx normal Oral cavity: MMM, tongue normal, teeth normal Neck: supple, no lymphadenopathy, no thyromegaly, no masses, normal ROM Chest: non tender, normal shape and expansion Heart: RRR, normal S1, S2, no murmurs Lungs: CTA bilaterally, no wheezes, rhonchi, or rales Abdomen: +bs, soft, non tender, non distended, no masses, no hepatomegaly, no splenomegaly, no bruits Extremities: no edema, no cyanosis, no clubbing Pulses: 2+ symmetric, upper and lower extremities, normal cap refill Neurological: alert, oriented x 3, CN2-12 intact, strength normal upper extremities and lower extremities, sensation normal throughout, DTRs 2+ throughout, no cerebellar signs, gait normal Psychiatric: normal affect, behavior normal, pleasant  EKG shows unifocal PVCs, probable right bundle block and now evidence of A. fib.  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 without serious comorbidity with body mass index (BMI) of 40.0 to 44.9 in adult [facility]) - Plan: CBC with Differential/Platelet, Comprehensive metabolic panel, Lipid panel  Atrial fibrillation, unspecified type ([place]) - Plan: CBC with Differential/Platelet, Comprehensive metabolic panel, Lipid panel, TSH, EKG 12-Lead, Ambulatory referral to Cardiology  Unifocal PVCs - Plan: EKG 12-Lead, Ambulatory referral to Cardiology  Stress due to illness of family member  OSA on CPAP  Need for hepatitis C screening test - Plan: Hepatitis C antibody  History of colonic polyps - Plan: Ambulatory referral to Gastroenterology  Alcohol use disorder, mild, abuse  Chronic allergic rhinitis, unspecified seasonality, unspecified trigger I did write a prescription for Cialis and he  will attempt to get a prescription through  [place]. Discussed the alcohol use in regard to his underlying cardiac condition is also in terms of using it for stress reduction. Strongly encouraged him to get involved in counseling and did give him information concerning this. He will also be referred back to Dr. Wynetta Emery for follow-up on colonoscopy. Referral to cardiology for further evaluation of his atrial fib. Discussed the dizziness as well as tremor in regard to possible early Parkinson's disease. I will refer him when he is ready to pursue this further. Explained that at this point it was not significant enough in terms of interfering with his ADLs to be of major concern. He is to get a readout on his CPAP to make sure this is working correctly. Continue on his blood pressure medications. Did not discuss his weight with him today as we covered a lot of other topics. Over 50 minutes, greater than 50% in counseling and coordination of care    Physical exam - discussed healthy lifestyle, diet, exercise, preventative care, vaccinations, and addressed their concerns.    Follow-up as needed

## 2016-08-11 NOTE — Patient Instructions (Addendum)
Check with an eldercare attorney and ask about a special needs trust time for counseling [place] Time to cut back on alcohol  Mr. Mcclarnon , Thank you for taking time to come for your Medicare Wellness Visit. I appreciate your ongoing commitment to your health goals. Please review the following plan we discussed and let me know if I can assist you in the future.   These are the goals we discussed: Goals    None      This is a list of the screening recommended for you and due dates:  Health Maintenance  Topic Date Due  .  Hepatitis C: One time screening is recommended by Center for Disease Control  (CDC) for  adults born from [birth year] through 1963.   [birth year]  . Pneumonia vaccines (2 of 2 - PPSV23) 03/04/2016  . Tetanus Vaccine  08/02/2017  . Colon Cancer Screening  03/25/2018  . Flu Shot  Completed  . Shingles Vaccine  Completed

## 2016-08-12 LAB — COMPREHENSIVE METABOLIC PANEL
ALBUMIN: 3.8 g/dL (ref 3.6–5.1)
ALK PHOS: 79 U/L (ref 40–115)
ALT: 33 U/L (ref 9–46)
AST: 29 U/L (ref 10–35)
BUN: 16 mg/dL (ref 7–25)
CALCIUM: 9.2 mg/dL (ref 8.6–10.3)
CO2: 27 mmol/L (ref 20–31)
Chloride: 104 mmol/L (ref 98–110)
Creat: 1.1 mg/dL (ref 0.70–1.25)
Glucose, Bld: 136 mg/dL — ABNORMAL HIGH (ref 65–99)
POTASSIUM: 4.3 mmol/L (ref 3.5–5.3)
Sodium: 140 mmol/L (ref 135–146)
TOTAL PROTEIN: 6.2 g/dL (ref 6.1–8.1)
Total Bilirubin: 0.8 mg/dL (ref 0.2–1.2)

## 2016-08-12 LAB — CBC WITH DIFFERENTIAL/PLATELET
Basophils Absolute: 79 cells/uL (ref 0–200)
Basophils Relative: 1 %
EOS ABS: 237 {cells}/uL (ref 15–500)
Eosinophils Relative: 3 %
HEMATOCRIT: 51.7 % — AB (ref 38.5–50.0)
Hemoglobin: 18.1 g/dL — ABNORMAL HIGH (ref 13.2–17.1)
LYMPHS PCT: 22 %
Lymphs Abs: 1738 cells/uL (ref 850–3900)
MCH: 33.7 pg — ABNORMAL HIGH (ref 27.0–33.0)
MCHC: 35 g/dL (ref 32.0–36.0)
MCV: 96.3 fL (ref 80.0–100.0)
MONO ABS: 790 {cells}/uL (ref 200–950)
Monocytes Relative: 10 %
Neutro Abs: 5056 cells/uL (ref 1500–7800)
Neutrophils Relative %: 64 %
Platelets: 97 10*3/uL — ABNORMAL LOW (ref 140–400)
RBC: 5.37 MIL/uL (ref 4.20–5.80)
RDW: 13.9 % (ref 11.0–15.0)
WBC: 7.9 10*3/uL (ref 4.0–10.5)

## 2016-08-12 LAB — LIPID PANEL
CHOL/HDL RATIO: 3.8 ratio (ref <5.0)
CHOLESTEROL: 164 mg/dL (ref <200)
HDL: 43 mg/dL (ref >40)
LDL Cholesterol: 101 mg/dL — ABNORMAL HIGH
TRIGLYCERIDES: 99 mg/dL (ref <150)
VLDL: 20 mg/dL (ref <30)

## 2016-08-12 LAB — TSH: TSH: 0.63 mIU/L (ref 0.40–4.50)

## 2016-08-12 LAB — HEPATITIS C ANTIBODY: HCV Ab: NEGATIVE

## 2016-08-16 ENCOUNTER — Telehealth: Payer: Self-pay | Admitting: Family Medicine

## 2016-08-16 ENCOUNTER — Encounter: Payer: Self-pay | Admitting: Cardiology

## 2016-08-16 LAB — HEMOGLOBIN A1C

## 2016-08-16 NOTE — Telephone Encounter (Signed)
Pt called and wants you to give him a call regarding his glucose, states that it is high on his labs, he got his labs on my chart, but would like you to give him a call and talk about this, pt can be reached at [phone]

## 2016-08-16 NOTE — Progress Notes (Signed)
HPI: 70 year old male for evaluation of atrial fibrillation. 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 Ejection fraction 42%. Echocardiogram June 2014 showed normal LV systolic function and moderate left atrial enlargement. Labs 11/17 showed TSH 0.63, Hgb 18.1, Cr 1.1. Patient recently seen by primary care for routine physical and noted to be in atrial fibrillation and we were asked to evaluate. He does not have chest pain, palpitations or syncope. Dyspnea with extreme activities but not routine activities. No orthopnea, PND, pedal edema or history of GI bleeding.  Current Outpatient Prescriptions  Medication Sig Dispense Refill  . amLODipine (NORVASC) 10 MG tablet Take 1 tablet (10 mg total) by mouth daily. 90 tablet 3  . cromolyn (NASALCROM) 5.2 MG/ACT nasal spray Place 1 spray into both nostrils 4 (four) times daily.    Mar[place] Kitchen lisinopril-hydrochlorothiazide (PRINZIDE,ZESTORETIC) 10-12.5 MG tablet Take 1 tablet by mouth daily. 90 tablet 3  . Multiple Vitamins-Minerals (PRESERVISION AREDS 2 PO) Take by mouth.    . Omega-3 Fatty Acids (FISH OIL PO) Take by mouth.    . Probiotic Product (PROBIOTIC DAILY PO) Take by mouth.    . tadalafil (CIALIS) 20 MG tablet TAKE 1 TABLET EVERY DAY AS NEEDED 5 tablet 6  . TRAVATAN Z 0.004 % SOLN ophthalmic solution Place 1 drop into the right eye as directed.  12   No current facility-administered medications for this visit.     Allergies  Allergen Reactions  . Benzalkonium Chloride     Causes Eye issues     Past Medical History:  Diagnosis Date  . Colonic polyp   . Diverticulitis   . Erythrocytosis   . Hypertension   . Obesity   . OSA (obstructive sleep apnea)     Past Surgical History:  Procedure Laterality Date  . COLONOSCOPY  03/2003   Dr. Wynetta Emery  . EYE SURGERY      Social History   Social History  . Marital status: Married    Spouse name: N/A  . Number of children: 1  . Years of education:  N/A   Occupational History  . Not on file.   Social History Main Topics  . Smoking status: Former Research scientist (life sciences)  . Smokeless tobacco: Never Used  . Alcohol use 4.2 oz/week    7 Glasses of wine per week     Comment: 3-4 glasses wine per night  . Drug use: No  . Sexual activity: Yes   Other Topics Concern  . Not on file   Social History Narrative  . No narrative on file    Family History  Problem Relation Age of Onset  . Hypertension Mother   . Hypertension Father   . Hypertension Sister     ROS: no fevers or chills, productive cough, hemoptysis, dysphasia, odynophagia, melena, hematochezia, dysuria, hematuria, rash, seizure activity, orthopnea, PND, pedal edema, claudication. Remaining systems are negative.  Physical Exam:   Blood pressure 122/74, pulse 88, height 6\' 1"  (1.854 m), weight 290 lb 9.6 oz (131.8 kg).  General:  Well developed/obese in NAD Skin warm/dry Patient not depressed No peripheral clubbing Back-normal HEENT-normal/normal eyelids Neck supple/normal carotid upstroke bilaterally; no bruits; no JVD; no thyromegaly chest - CTA/ normal expansion CV - irregular/normal S1 and S2; no murmurs, rubs or gallops;  PMI nondisplaced Abdomen -NT/ND, no HSM, no mass, + bowel sounds, no bruit 2+ femoral pulses, no bruits Ext-no edema, chords, 2+ DP Neuro-grossly nonfocal  ECG -08/11/2016-atrial fibrillation with PVCs or aberrantly  conducted beats, prior septal infarct cannot be excluded.  ECG today - Atrial fibrillation with PVCs or aberrantly conducted beats. Left axis deviation. Right bundle branch block. Anterior infarct.  A/P  1 atrial fibrillation-duration unknown. Rate appears to be controlled on no medications. I will arrange a 48 hour Holter monitor to further assess. We will add AV nodal blocking agents as needed. Embolic risk factors include age greater than 30 and hypertension. He may also have newly diagnosed diabetes mellitus. CHADSvasc 3. Add apixaban 5  mg BID. Check hemoglobin and renal function in 4 weeks. He will return to see me in 6 weeks and we will discuss further cardioversion versus rate control at that time. We may try cardioversion one time but given that he is asymptomatic I would not pursue rhythm control long-term.  2 bruit-scheduled abdominal ultrasound to exclude aneurysm.  3 hypertension-blood pressure controlled. Continue present medications.  4 tobacco abuse-counseled on discontinuing.  5 ETOH-pt counseled on reducing.      Kirk Ruths, MD

## 2016-08-20 ENCOUNTER — Encounter: Payer: Self-pay | Admitting: Cardiology

## 2016-08-23 ENCOUNTER — Ambulatory Visit (INDEPENDENT_AMBULATORY_CARE_PROVIDER_SITE_OTHER): Payer: Medicare HMO | Admitting: Cardiology

## 2016-08-23 ENCOUNTER — Encounter: Payer: Self-pay | Admitting: Cardiology

## 2016-08-23 VITALS — BP 122/74 | HR 88 | Ht 73.0 in | Wt 290.6 lb

## 2016-08-23 DIAGNOSIS — I4891 Unspecified atrial fibrillation: Secondary | ICD-10-CM | POA: Diagnosis not present

## 2016-08-23 DIAGNOSIS — R0989 Other specified symptoms and signs involving the circulatory and respiratory systems: Secondary | ICD-10-CM

## 2016-08-23 MED ORDER — APIXABAN 5 MG PO TABS: 5.0000 mg | ORAL_TABLET | Freq: Two times a day (BID) | ORAL | 6 refills | Status: DC

## 2016-08-23 NOTE — Patient Instructions (Signed)
Medication Instructions:   START ELIQUIS 5 MG ONE TABLET TWICE DAILY  Labwork:  Your physician recommends that you return for lab work in [place]  Testing/Procedures:  Your physician has requested that you have an echocardiogram. Echocardiography is a painless test that uses sound waves to create images of your heart. It provides your doctor with information about the size and shape of your heart and how well your heart's chambers and valves are working. This procedure takes approximately one hour. There are no restrictions for this procedure.   Your physician has recommended that you wear a 48 HOUR holter monitor. Holter monitors are medical devices that record the heart's electrical activity. Doctors most often use these monitors to diagnose arrhythmias. Arrhythmias are problems with the speed or rhythm of the heartbeat. The monitor is a small, portable device. You can wear one while you do your normal daily activities. This is usually used to diagnose what is causing palpitations/syncope (passing out).   Your physician has requested that you have an abdominal aorta duplex. During this test, an ultrasound is used to evaluate the aorta. Allow 30 minutes for this exam. Do not eat after midnight the day before and avoid carbonated beverages   Follow-Up:  Your physician recommends that you schedule a follow-up appointment in: [place]

## 2016-08-24 ENCOUNTER — Ambulatory Visit (INDEPENDENT_AMBULATORY_CARE_PROVIDER_SITE_OTHER): Payer: Medicare HMO | Admitting: Family Medicine

## 2016-08-24 ENCOUNTER — Encounter: Payer: Self-pay | Admitting: Family Medicine

## 2016-08-24 VITALS — BP 110/70 | HR 68 | Ht 73.0 in | Wt 292.0 lb

## 2016-08-24 DIAGNOSIS — Z6841 Body Mass Index (BMI) 40.0 and over, adult: Secondary | ICD-10-CM | POA: Diagnosis not present

## 2016-08-24 DIAGNOSIS — R7302 Impaired glucose tolerance (oral): Secondary | ICD-10-CM

## 2016-08-24 DIAGNOSIS — IMO0001 Reserved for inherently not codable concepts without codable children: Secondary | ICD-10-CM

## 2016-08-24 DIAGNOSIS — R7309 Other abnormal glucose: Secondary | ICD-10-CM

## 2016-08-24 DIAGNOSIS — E6609 Other obesity due to excess calories: Secondary | ICD-10-CM

## 2016-08-24 LAB — POCT GLYCOSYLATED HEMOGLOBIN (HGB A1C): HEMOGLOBIN A1C: 6.2

## 2016-08-24 NOTE — Progress Notes (Signed)
   Subjective:    Patient ID: Lee Kim, male    DOB: [birth year], 70 y.o.   MRN: [id number]  HPI He is here for further evaluation of an abnormal blood sugar reading.   Review of Systems     Objective:   Physical Exam Alert and in no distress. 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 We also discussed this in regard to blood pressure, weight, OSA, exercise. He is not interested in seeing a nutritionist. I did recommend that he check several websites including American diabetes Association as well as looking into the [place]/Mediterranean diet. We then discussed exercise with him. I recommended any physical activity on a daily basis for at least 20 minutes. He will return here in 6 months for follow-up concerning this.

## 2016-08-24 NOTE — Patient Instructions (Signed)
American diabetes Association website. Also look up [place] or the [employer]. The bottom line is most of these are carbohydrate reduction. An easy way to remember it is it is to cut back on white food . Bread, rice, pasta, potatoes,sugar. 150 minutes a week of something physical which can be translated into 20 minutes everyday which can be translated into 2-10 minute walks

## 2016-08-31 ENCOUNTER — Ambulatory Visit: Payer: Medicare HMO

## 2016-08-31 ENCOUNTER — Other Ambulatory Visit: Payer: Self-pay

## 2016-08-31 ENCOUNTER — Ambulatory Visit (HOSPITAL_COMMUNITY): Payer: Medicare HMO | Attending: Internal Medicine

## 2016-08-31 DIAGNOSIS — I10 Essential (primary) hypertension: Secondary | ICD-10-CM | POA: Insufficient documentation

## 2016-08-31 DIAGNOSIS — I4891 Unspecified atrial fibrillation: Secondary | ICD-10-CM | POA: Insufficient documentation

## 2016-08-31 DIAGNOSIS — E669 Obesity, unspecified: Secondary | ICD-10-CM | POA: Diagnosis not present

## 2016-08-31 DIAGNOSIS — Z6838 Body mass index (BMI) 38.0-38.9, adult: Secondary | ICD-10-CM | POA: Insufficient documentation

## 2016-08-31 DIAGNOSIS — Z87891 Personal history of nicotine dependence: Secondary | ICD-10-CM | POA: Diagnosis not present

## 2016-08-31 DIAGNOSIS — I340 Nonrheumatic mitral (valve) insufficiency: Secondary | ICD-10-CM | POA: Diagnosis not present

## 2016-08-31 DIAGNOSIS — I071 Rheumatic tricuspid insufficiency: Secondary | ICD-10-CM | POA: Diagnosis not present

## 2016-09-01 ENCOUNTER — Telehealth: Payer: Self-pay | Admitting: *Deleted

## 2016-09-01 DIAGNOSIS — I77810 Thoracic aortic ectasia: Secondary | ICD-10-CM

## 2016-09-01 NOTE — Telephone Encounter (Signed)
-----   Message from Lelon Perla, MD sent at 09/01/2016  7:25 AM EST ----- Schedule CTA of thoracic aorta Kirk Ruths

## 2016-09-01 NOTE — Telephone Encounter (Signed)
Unable to reach pt or leave a message  

## 2016-09-02 NOTE — Telephone Encounter (Signed)
pt aware of results, CTA scheduled.

## 2016-09-03 ENCOUNTER — Ambulatory Visit (INDEPENDENT_AMBULATORY_CARE_PROVIDER_SITE_OTHER): Payer: Medicare HMO

## 2016-09-03 DIAGNOSIS — I4891 Unspecified atrial fibrillation: Secondary | ICD-10-CM | POA: Diagnosis not present

## 2016-09-10 ENCOUNTER — Ambulatory Visit (INDEPENDENT_AMBULATORY_CARE_PROVIDER_SITE_OTHER)
Admission: RE | Admit: 2016-09-10 | Discharge: 2016-09-10 | Disposition: A | Discharge status: Home / Self Care | Payer: Medicare HMO | Source: Ambulatory Visit | Attending: Cardiology | Admitting: Cardiology

## 2016-09-10 DIAGNOSIS — I77810 Thoracic aortic ectasia: Secondary | ICD-10-CM | POA: Diagnosis not present

## 2016-09-10 MED ORDER — IOPAMIDOL (ISOVUE-370) INJECTION 76%
100.0000 mL | Freq: Once | INTRAVENOUS | Status: AC | PRN
  Administered 2016-09-10: 100 mL via INTRAVENOUS

## 2016-09-13 ENCOUNTER — Telehealth: Payer: Self-pay | Admitting: *Deleted

## 2016-09-13 DIAGNOSIS — E079 Disorder of thyroid, unspecified: Secondary | ICD-10-CM

## 2016-09-13 NOTE — Telephone Encounter (Signed)
-----   Message from Lelon Perla, MD sent at 09/10/2016  6:23 PM EST ----- Thyroid ultrasound, fu cta 1 year Kirk Ruths

## 2016-09-13 NOTE — Telephone Encounter (Signed)
pt aware of results. [place] orders placed and sent to admin for scheduling.

## 2016-09-15 ENCOUNTER — Ambulatory Visit (HOSPITAL_COMMUNITY)
Admission: RE | Admit: 2016-09-15 | Discharge: 2016-09-15 | Disposition: A | Discharge status: Home / Self Care | Payer: Medicare HMO | Source: Ambulatory Visit | Attending: Cardiovascular Disease | Admitting: Cardiovascular Disease

## 2016-09-15 DIAGNOSIS — R0989 Other specified symptoms and signs involving the circulatory and respiratory systems: Secondary | ICD-10-CM | POA: Diagnosis not present

## 2016-09-15 DIAGNOSIS — I10 Essential (primary) hypertension: Secondary | ICD-10-CM | POA: Diagnosis not present

## 2016-09-15 DIAGNOSIS — E785 Hyperlipidemia, unspecified: Secondary | ICD-10-CM | POA: Insufficient documentation

## 2016-09-15 DIAGNOSIS — Z87891 Personal history of nicotine dependence: Secondary | ICD-10-CM | POA: Insufficient documentation

## 2016-09-15 DIAGNOSIS — Z136 Encounter for screening for cardiovascular disorders: Secondary | ICD-10-CM | POA: Diagnosis not present

## 2016-09-20 ENCOUNTER — Ambulatory Visit
Admission: RE | Admit: 2016-09-20 | Discharge: 2016-09-20 | Disposition: A | Discharge status: Home / Self Care | Payer: Medicare HMO | Source: Ambulatory Visit | Attending: Cardiology | Admitting: Cardiology

## 2016-09-20 DIAGNOSIS — E079 Disorder of thyroid, unspecified: Secondary | ICD-10-CM

## 2016-09-22 ENCOUNTER — Other Ambulatory Visit: Payer: Self-pay

## 2016-09-22 DIAGNOSIS — E041 Nontoxic single thyroid nodule: Secondary | ICD-10-CM

## 2016-09-23 ENCOUNTER — Other Ambulatory Visit: Payer: Self-pay | Admitting: Cardiology

## 2016-09-23 ENCOUNTER — Telehealth: Payer: Self-pay | Admitting: Cardiology

## 2016-09-23 NOTE — Telephone Encounter (Signed)
Note forwarded to cathy via EPIC

## 2016-09-23 NOTE — Telephone Encounter (Signed)
°  Request for surgical clearance:  1. What type of surgery is being performed?  Thyroid biopsy  2. When is this surgery scheduled?  Not scheduled first  3. Are there any medications that need to be held prior to surgery and how long?  Eliquis 48 hours prior to procedure  4. Name of physician performing surgery?  Radiologist scheduled for that day  5. What is your office phone and fax number?  [phone]

## 2016-09-23 NOTE — Telephone Encounter (Signed)
Hold apixaban 2 days prior to procedure and resume 2 days after Brian Crenshaw  

## 2016-09-23 NOTE — Telephone Encounter (Signed)
Will forward for dr crenshaw review  

## 2016-10-04 NOTE — Progress Notes (Signed)
HPI: FU atrial fibrillation. 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 Ejection fraction 42%. Echocardiogram June 2014 showed normal LV systolic function and moderate left atrial enlargement. Labs 11/17 showed TSH 0.63, Hgb 18.1, Cr 1.1. Patient previously seen by primary care for routine physical and noted to be in atrial fibrillation. Echo 11/17 showed EF 45-50, moderate to severe LVH, mildly dilated aortic root, mild biatrial enlargement. CTA showed dilated aortic root (4.2 cm) and 4 cm thyroid nodule. Ultrasound 12/17 showed goiter and right side nodules (bx recommended and fu study one year recommended). Primary care now following. Holter 12/17 showed afib with 20 beats WCT felt to be aberrancy. Abd ultrasound 12/17 showed no aneurysm. Since last seen, the patient denies any dyspnea on exertion, orthopnea, PND, pedal edema, palpitations, syncope or chest pain.   Current Outpatient Prescriptions  Medication Sig Dispense Refill  . amLODipine (NORVASC) 10 MG tablet Take 1 tablet (10 mg total) by mouth daily. 90 tablet 3  . apixaban (ELIQUIS) 5 MG TABS tablet Take 1 tablet (5 mg total) by mouth 2 (two) times daily. 60 tablet 6  . cromolyn (NASALCROM) 5.2 MG/ACT nasal spray Place 1 spray into both nostrils 4 (four) times daily.    Marland Kitchen lisinopril-hydrochlorothiazide (PRINZIDE,ZESTORETIC) 10-12.5 MG tablet Take 1 tablet by mouth daily. 90 tablet 3  . Multiple Vitamins-Minerals (PRESERVISION AREDS 2 PO) Take by mouth.    . Omega-3 Fatty Acids (FISH OIL PO) Take by mouth.    . Probiotic Product (PROBIOTIC DAILY PO) Take by mouth.    . tadalafil (CIALIS) 20 MG tablet TAKE 1 TABLET EVERY DAY AS NEEDED 5 tablet 6  . TRAVATAN Z 0.004 % SOLN ophthalmic solution Place 1 drop into the right eye as directed.  12   No current facility-administered medications for this visit.      Past Medical History:  Diagnosis Date  . Colonic polyp   . Diverticulitis   .  Erythrocytosis   . Hypertension   . Obesity   . OSA (obstructive sleep apnea)     Past Surgical History:  Procedure Laterality Date  . COLONOSCOPY  03/2003   Dr. Wynetta Emery  . EYE SURGERY      Social History   Social History  . Marital status: Married    Spouse name: N/A  . Number of children: 1  . Years of education: N/A   Occupational History  . Not on file.   Social History Main Topics  . Smoking status: Former Research scientist (life sciences)  . Smokeless tobacco: Never Used  . Alcohol use 4.2 oz/week    7 Glasses of wine per week     Comment: 3-4 glasses wine per night  . Drug use: No  . Sexual activity: Yes   Other Topics Concern  . Not on file   Social History Narrative  . No narrative on file    Family History  Problem Relation Age of Onset  . Hypertension Mother   . Hypertension Father   . Hypertension Sister     ROS: no fevers or chills, productive cough, hemoptysis, dysphasia, odynophagia, melena, hematochezia, dysuria, hematuria, rash, seizure activity, orthopnea, PND, pedal edema, claudication. Remaining systems are negative.  Physical Exam: Well-developed obese in no acute distress.  Skin is warm and dry.  HEENT is normal.  Neck is supple.  Chest is clear to auscultation with normal expansion.  Cardiovascular exam is irregular Abdominal exam nontender or distended. No masses palpated. Extremities  show no edema. neuro grossly intact  ECG- Atrial fibrillation at a rate of 83. 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 Cannot rule out prior anterior infarct. A/P  1 atrial fibrillation-duration unknown. Rate controlled on no medications. Embolic risk factors include age greater than 78 and hypertension. He may also have newly diagnosed diabetes mellitus. CHADSvasc 3. Continue apixaban 5 mg BID. Check hemoglobin and renal function. We discussed rate control versus rhythm control today. He is asymptomatic and duration of atrial fibrillation is unknown. I  would therefore favor rate control and he agrees. He is scheduled for thyroid biopsy and colonoscopy in the near future. His apixaban should be held 2 days prior to each procedure and resumed 2 days after.  2 hypertension-blood pressure controlled. Continue present medications.  3 tobacco abuse-counseled on discontinuing and is making effort.  4 ETOH-pt counseled on reducing.  5 thoracic aortic aneurysm-follow-up CTA November 2018.  6 Thyroid nodule-scheduled for biopsy. Primary care has agreed to follow this issue.       Kirk Ruths, MD

## 2016-10-11 ENCOUNTER — Encounter: Payer: Self-pay | Admitting: Cardiology

## 2016-10-11 ENCOUNTER — Ambulatory Visit (INDEPENDENT_AMBULATORY_CARE_PROVIDER_SITE_OTHER): Payer: PPO | Admitting: Cardiology

## 2016-10-11 ENCOUNTER — Encounter (INDEPENDENT_AMBULATORY_CARE_PROVIDER_SITE_OTHER): Payer: Self-pay

## 2016-10-11 VITALS — BP 122/82 | HR 83 | Ht 73.0 in | Wt 294.0 lb

## 2016-10-11 DIAGNOSIS — I4821 Permanent atrial fibrillation: Secondary | ICD-10-CM

## 2016-10-11 DIAGNOSIS — I77810 Thoracic aortic ectasia: Secondary | ICD-10-CM | POA: Diagnosis not present

## 2016-10-11 DIAGNOSIS — I482 Chronic atrial fibrillation: Secondary | ICD-10-CM

## 2016-10-11 DIAGNOSIS — I10 Essential (primary) hypertension: Secondary | ICD-10-CM | POA: Diagnosis not present

## 2016-10-11 LAB — BASIC METABOLIC PANEL
BUN: 25 mg/dL (ref 7–25)
CO2: 31 mmol/L (ref 20–31)
CREATININE: 1.46 mg/dL — AB (ref 0.70–1.25)
Calcium: 9.1 mg/dL (ref 8.6–10.3)
Chloride: 103 mmol/L (ref 98–110)
Glucose, Bld: 239 mg/dL — ABNORMAL HIGH (ref 65–99)
Potassium: 4.5 mmol/L (ref 3.5–5.3)
Sodium: 140 mmol/L (ref 135–146)

## 2016-10-11 LAB — CBC
HEMATOCRIT: 52 % — AB (ref 38.5–50.0)
Hemoglobin: 17.7 g/dL — ABNORMAL HIGH (ref 13.2–17.1)
MCH: 32.8 pg (ref 27.0–33.0)
MCHC: 34 g/dL (ref 32.0–36.0)
MCV: 96.3 fL (ref 80.0–100.0)
MPV: 12.8 fL — ABNORMAL HIGH (ref 7.5–12.5)
PLATELETS: 179 10*3/uL (ref 140–400)
RBC: 5.4 MIL/uL (ref 4.20–5.80)
RDW: 13.4 % (ref 11.0–15.0)
WBC: 8 10*3/uL (ref 3.8–10.8)

## 2016-10-11 NOTE — Patient Instructions (Signed)
LABS -CBC,BMP   MAY HOLD ELIQUIS  2 DAYS PRIOR TO PROCEDURE     Your physician wants you to follow-up in [place]. You will receive a reminder letter in the mail two months in advance. If you don't receive a letter, please call our office to schedule the follow-up appointment.    If you need a refill on your cardiac medications before your next appointment, please call your pharmacy.

## 2016-10-12 ENCOUNTER — Telehealth: Payer: Self-pay | Admitting: *Deleted

## 2016-10-12 DIAGNOSIS — I4891 Unspecified atrial fibrillation: Secondary | ICD-10-CM

## 2016-10-12 NOTE — Telephone Encounter (Signed)
Left message of results for patient. Lab orders mailed to the pt for repeat in 2 weeks.

## 2016-10-12 NOTE — Telephone Encounter (Signed)
-----   Message from Lelon Perla, MD sent at 10/11/2016  4:58 PM EST ----- Repeat bmet 2 weeks Kirk Ruths

## 2016-10-15 ENCOUNTER — Other Ambulatory Visit: Payer: Self-pay | Admitting: Gastroenterology

## 2016-10-19 ENCOUNTER — Ambulatory Visit
Admission: RE | Admit: 2016-10-19 | Discharge: 2016-10-19 | Disposition: A | Discharge status: Home / Self Care | Payer: PPO | Source: Ambulatory Visit | Attending: Family Medicine | Admitting: Family Medicine

## 2016-10-19 ENCOUNTER — Other Ambulatory Visit (HOSPITAL_COMMUNITY)
Admission: RE | Admit: 2016-10-19 | Discharge: 2016-10-19 | Disposition: A | Discharge status: Home / Self Care | Payer: PPO | Source: Ambulatory Visit | Attending: Radiology | Admitting: Radiology

## 2016-10-19 DIAGNOSIS — E041 Nontoxic single thyroid nodule: Secondary | ICD-10-CM

## 2016-10-19 DIAGNOSIS — E042 Nontoxic multinodular goiter: Secondary | ICD-10-CM | POA: Diagnosis not present

## 2016-10-29 DIAGNOSIS — I4891 Unspecified atrial fibrillation: Secondary | ICD-10-CM | POA: Diagnosis not present

## 2016-10-30 LAB — BASIC METABOLIC PANEL
BUN: 17 mg/dL (ref 7–25)
CALCIUM: 8.9 mg/dL (ref 8.6–10.3)
CHLORIDE: 105 mmol/L (ref 98–110)
CO2: 28 mmol/L (ref 20–31)
Creat: 1.21 mg/dL (ref 0.70–1.25)
Glucose, Bld: 134 mg/dL — ABNORMAL HIGH (ref 65–99)
Potassium: 3.8 mmol/L (ref 3.5–5.3)
SODIUM: 141 mmol/L (ref 135–146)

## 2016-11-05 ENCOUNTER — Other Ambulatory Visit: Payer: Self-pay | Admitting: Family Medicine

## 2016-11-23 ENCOUNTER — Ambulatory Visit (HOSPITAL_COMMUNITY)
Admission: RE | Admit: 2016-11-23 | Discharge: 2016-11-23 | Disposition: A | Discharge status: Home / Self Care | Payer: PPO | Source: Ambulatory Visit | Attending: Gastroenterology | Admitting: Gastroenterology

## 2016-11-23 ENCOUNTER — Ambulatory Visit (HOSPITAL_COMMUNITY): Payer: PPO | Admitting: Anesthesiology

## 2016-11-23 ENCOUNTER — Encounter (HOSPITAL_COMMUNITY): Payer: Self-pay | Admitting: Registered Nurse

## 2016-11-23 ENCOUNTER — Encounter (HOSPITAL_COMMUNITY)
Admission: RE | Disposition: A | Discharge status: Home / Self Care | Payer: Self-pay | Source: Ambulatory Visit | Attending: Gastroenterology

## 2016-11-23 DIAGNOSIS — I4891 Unspecified atrial fibrillation: Secondary | ICD-10-CM | POA: Diagnosis not present

## 2016-11-23 DIAGNOSIS — Z86010 Personal history of colonic polyps: Secondary | ICD-10-CM | POA: Diagnosis not present

## 2016-11-23 DIAGNOSIS — D122 Benign neoplasm of ascending colon: Secondary | ICD-10-CM | POA: Diagnosis not present

## 2016-11-23 DIAGNOSIS — Z6838 Body mass index (BMI) 38.0-38.9, adult: Secondary | ICD-10-CM | POA: Insufficient documentation

## 2016-11-23 DIAGNOSIS — D120 Benign neoplasm of cecum: Secondary | ICD-10-CM | POA: Diagnosis not present

## 2016-11-23 DIAGNOSIS — K5730 Diverticulosis of large intestine without perforation or abscess without bleeding: Secondary | ICD-10-CM | POA: Insufficient documentation

## 2016-11-23 DIAGNOSIS — I10 Essential (primary) hypertension: Secondary | ICD-10-CM | POA: Insufficient documentation

## 2016-11-23 DIAGNOSIS — G4733 Obstructive sleep apnea (adult) (pediatric): Secondary | ICD-10-CM | POA: Diagnosis not present

## 2016-11-23 DIAGNOSIS — D124 Benign neoplasm of descending colon: Secondary | ICD-10-CM | POA: Insufficient documentation

## 2016-11-23 DIAGNOSIS — D751 Secondary polycythemia: Secondary | ICD-10-CM | POA: Insufficient documentation

## 2016-11-23 DIAGNOSIS — Z1211 Encounter for screening for malignant neoplasm of colon: Secondary | ICD-10-CM | POA: Diagnosis not present

## 2016-11-23 DIAGNOSIS — Z7901 Long term (current) use of anticoagulants: Secondary | ICD-10-CM | POA: Diagnosis not present

## 2016-11-23 DIAGNOSIS — Z87891 Personal history of nicotine dependence: Secondary | ICD-10-CM | POA: Diagnosis not present

## 2016-11-23 HISTORY — PX: COLONOSCOPY WITH PROPOFOL: SHX5780

## 2016-11-23 SURGERY — COLONOSCOPY WITH PROPOFOL
Anesthesia: Monitor Anesthesia Care

## 2016-11-23 MED ORDER — ONDANSETRON HCL 4 MG/2ML IJ SOLN
INTRAMUSCULAR | Status: DC | PRN
Start: 2016-11-23 — End: 2016-11-23
  Administered 2016-11-23: 4 mg via INTRAVENOUS

## 2016-11-23 MED ORDER — LIDOCAINE 2% (20 MG/ML) 5 ML SYRINGE
INTRAMUSCULAR | Status: DC | PRN
  Administered 2016-11-23: 100 mg via INTRAVENOUS

## 2016-11-23 MED ORDER — PROPOFOL 10 MG/ML IV BOLUS
INTRAVENOUS | Status: DC | PRN
  Administered 2016-11-23: 20 mg via INTRAVENOUS

## 2016-11-23 MED ORDER — ONDANSETRON HCL 4 MG/2ML IJ SOLN
INTRAMUSCULAR | Status: AC
  Filled 2016-11-23: qty 2

## 2016-11-23 MED ORDER — LACTATED RINGERS IV SOLN
INTRAVENOUS | Status: DC
  Administered 2016-11-23: 10:00:00 via INTRAVENOUS

## 2016-11-23 MED ORDER — PROPOFOL 10 MG/ML IV BOLUS
INTRAVENOUS | Status: AC
  Filled 2016-11-23: qty 60

## 2016-11-23 MED ORDER — PROPOFOL 500 MG/50ML IV EMUL
INTRAVENOUS | Status: DC | PRN
Start: 2016-11-23 — End: 2016-11-23
  Administered 2016-11-23: 150 ug/kg/min via INTRAVENOUS

## 2016-11-23 MED ORDER — LIDOCAINE 2% (20 MG/ML) 5 ML SYRINGE
INTRAMUSCULAR | Status: AC
  Filled 2016-11-23: qty 5

## 2016-11-23 MED ORDER — SODIUM CHLORIDE 0.9 % IV SOLN: INTRAVENOUS | Status: DC

## 2016-11-23 SURGICAL SUPPLY — 22 items

## 2016-11-23 NOTE — Anesthesia Postprocedure Evaluation (Addendum)
Anesthesia Post Note  Patient: Lee Kim  Procedure(s) Performed: Procedure(s) (LRB): COLONOSCOPY WITH PROPOFOL (N/A)  Patient location during evaluation: Endoscopy Anesthesia Type: MAC Level of consciousness: oriented, patient cooperative and awake and alert Pain management: pain level controlled Vital Signs Assessment: post-procedure vital signs reviewed and stable Respiratory status: spontaneous breathing, nonlabored ventilation and respiratory function stable Cardiovascular status: blood pressure returned to baseline and stable Postop Assessment: no signs of nausea or vomiting Anesthetic complications: no       Last Vitals:  Vitals:   11/23/16 1110 11/23/16 1120  BP: 134/87 121/86  Pulse: 89 (!) 59  Resp: 18 (!) 23  Temp: 36.4 C     Last Pain:  Vitals:   11/23/16 1110  TempSrc: Oral                 [name],E. Asiah Befort

## 2016-11-23 NOTE — Transfer of Care (Signed)
Immediate Anesthesia Transfer of Care Note  Patient: Lee Kim  Procedure(s) Performed: Procedure(s): COLONOSCOPY WITH PROPOFOL (N/A)  Patient Location: PACU  Anesthesia Type:MAC  Level of Consciousness:  sedated, patient cooperative and responds to stimulation  Airway & Oxygen Therapy:Patient Spontanous Breathing and Patient connected to face mask oxgen  Post-op Assessment:  Report given to PACU RN and Post -op Vital signs reviewed and stable  Post vital signs:  Reviewed and stable  Last Vitals:  Vitals:   11/23/16 0957  BP: 113/87  Pulse: 89  Resp: 15  Temp: 67.8 C    Complications: No apparent anesthesia complications

## 2016-11-23 NOTE — Discharge Instructions (Signed)

## 2016-11-23 NOTE — Op Note (Signed)
[facility] Patient Name: Lee Kim Procedure Date: 11/23/2016 MRN: [id number] Attending MD: Garlan Fair , MD Date of Birth: 11-06-1945 CSN: [id number] Age: 71 Admit Type: Outpatient Procedure:                Colonoscopy Indications:              High risk colon cancer surveillance: Personal                            history of non-advanced adenoma Providers:                Garlan Fair, MD, Laverta Baltimore RN, RN,                            Corliss Parish, Technician Referring MD:              Medicines:                Propofol per Anesthesia Complications:            No immediate complications. Estimated Blood Loss:     Estimated blood loss was minimal. Procedure:                Pre-Anesthesia Assessment:                           - Prior to the procedure, a History and Physical                            was performed, and patient medications and                            allergies were reviewed. The patient's tolerance of                            previous anesthesia was also reviewed. The risks                            and benefits of the procedure and the sedation                            options and risks were discussed with the patient.                            All questions were answered, and informed consent                            was obtained. Prior Anticoagulants: The patient has                            taken Eliquis (apixaban), last dose was 3 days                            prior to procedure. ASA Grade Assessment: II - A  patient with mild systemic disease. After reviewing                            the risks and benefits, the patient was deemed in                            satisfactory condition to undergo the procedure.                           After obtaining informed consent, the colonoscope                            was passed under direct vision. Throughout the     procedure, the patient's blood pressure, pulse, and                            oxygen saturations were monitored continuously. The                            [id number] scope was introduced through                            the anus and advanced to the the cecum, identified                            by appendiceal orifice and ileocecal valve. The                            colonoscopy was somewhat difficult due to                            significant looping. The patient tolerated the                            procedure well. The quality of the bowel                            preparation was good. The appendiceal orifice and                            the rectum were photographed. Findings:      The perianal and digital rectal examinations were normal.      A 3 mm polyp was found in the cecum. The polyp was sessile. The polyp       was removed with a cold biopsy forceps. Resection and retrieval were       complete. An endoclip was applied to the polypectomy site.      Two sessile polyps were found in the ascending colon. The polyps were 5       mm in size. These polyps were removed with a cold snare. Resection and       retrieval were complete.      A 5 mm polyp was found in the descending colon. The polyp was sessile.       The polyp was removed with a cold bipsy forceps.  Resection and retrieval       were complete.      The exam was otherwise without abnormality. Impression:               - One 3 mm polyp in the cecum, removed with a cold                            biopsy forceps. Resected and retrieved.                           - Two 5 mm polyps in the ascending colon, removed                            with a cold snare. Resected and retrieved.                           - One 5 mm polyp in the descending colon, removed                            with a cold snare. Resected and retrieved.                           - The examination was otherwise normal. Moderate  Sedation:      N/A- Per Anesthesia Care Recommendation:           - Patient has a contact number available for                            emergencies. The signs and symptoms of potential                            delayed complications were discussed with the                            patient. Return to normal activities tomorrow.                            Written discharge instructions were provided to the                            patient.                           - Repeat colonoscopy date to be determined after                            pending pathology results are reviewed for                            surveillance.                           - Resume previous diet.                           - Continue present  medications. Procedure Code(s):        --- Professional ---                           [id number], Colonoscopy, flexible; with removal of                            tumor(s), polyp(s), or other lesion(s) by snare                            technique                           45380, 5, Colonoscopy, flexible; with biopsy,                            single or multiple Diagnosis Code(s):        --- Professional ---                           Z86.010, Personal history of colonic polyps                           D12.0, Benign neoplasm of cecum                           D12.4, Benign neoplasm of descending colon                           D12.2, Benign neoplasm of ascending colon CPT copyright 2016 American Medical Association. All rights reserved. The codes documented in this report are preliminary and upon coder review may  be revised to meet current compliance requirements. Earle Gell, MD Garlan Fair, MD 11/23/2016 11:09:41 AM This report has been signed electronically. Number of Addenda: 0

## 2016-11-23 NOTE — H&P (Signed)
Procedure: Surveillance colonoscopy. 12/23/1997 colonoscopy was performed with removal of a 2 mm distal ascending colon tubular adenomatous polyp. Normal surveillance colonoscopies were performed in June 2004 and June 2009.  History: The patient is a 71 year old male born 1946/04/28. He is scheduled to undergo a repeat surveillance colonoscopy today. He stopped taking Eliquis 4 days prior to his colonoscopy.  Past medical history: Hypertension. Obstructive sleep apnea syndrome. Erythrocytosis. Atrial fibrillation.  Exam: The patient is alert and lying comfortably on the endoscopy stretcher. Cardiac exam reveals a regular rhythm. Lungs are clear to auscultation. Abdomen is soft and nontender to palpation.  Plan: Proceed with surveillance colonoscopy

## 2016-11-23 NOTE — Anesthesia Preprocedure Evaluation (Addendum)
Anesthesia Evaluation  Patient identified by MRN, date of birth, ID band Patient awake    Reviewed: Allergy & Precautions, NPO status , Patient's Chart, lab work & pertinent test results  History of Anesthesia Complications Negative for: history of anesthetic complications  Airway Mallampati: III  TM Distance: >3 FB Neck ROM: Full    Dental  (+) Dental Advisory Given   Pulmonary sleep apnea and Continuous Positive Airway Pressure Ventilation , COPD, former smoker,    breath sounds clear to auscultation       Cardiovascular hypertension, Pt. on medications (-) angina+ dysrhythmias Atrial Fibrillation  Rhythm:Irregular Rate:Normal  11/17 ECHO: LVEF 45-50%, diffuse hypokinesis, moderate to severe LVH, valves OK   Neuro/Psych    GI/Hepatic negative GI ROS, Neg liver ROS,   Endo/Other  Morbid obesity  Renal/GU negative Renal ROS     Musculoskeletal   Abdominal (+) + obese,   Peds  Hematology  (+) Blood dyscrasia (eliquis, off since saturday), ,   Anesthesia Other Findings   Reproductive/Obstetrics                            Anesthesia Physical Anesthesia Plan  ASA: III  Anesthesia Plan: MAC   Post-op Pain Management:    Induction: Intravenous  Airway Management Planned: Natural Airway and Simple Face Mask  Additional Equipment:   Intra-op Plan:   Post-operative Plan:   Informed Consent: I have reviewed the patients History and Physical, chart, labs and discussed the procedure including the risks, benefits and alternatives for the proposed anesthesia with the patient or authorized representative who has indicated his/her understanding and acceptance.   Dental advisory given  Plan Discussed with: CRNA and Surgeon  Anesthesia Plan Comments: (Plan routine monitors, MAC)        Anesthesia Quick Evaluation

## 2016-11-24 ENCOUNTER — Encounter (HOSPITAL_COMMUNITY): Payer: Self-pay | Admitting: Gastroenterology

## 2017-02-21 ENCOUNTER — Ambulatory Visit (INDEPENDENT_AMBULATORY_CARE_PROVIDER_SITE_OTHER): Payer: PPO | Admitting: Family Medicine

## 2017-02-21 ENCOUNTER — Encounter: Payer: Self-pay | Admitting: Family Medicine

## 2017-02-21 VITALS — BP 124/80 | HR 70 | Ht 73.0 in | Wt 288.0 lb

## 2017-02-21 DIAGNOSIS — Z6379 Other stressful life events affecting family and household: Secondary | ICD-10-CM

## 2017-02-21 DIAGNOSIS — G4733 Obstructive sleep apnea (adult) (pediatric): Secondary | ICD-10-CM

## 2017-02-21 DIAGNOSIS — R7302 Impaired glucose tolerance (oral): Secondary | ICD-10-CM | POA: Diagnosis not present

## 2017-02-21 DIAGNOSIS — I482 Chronic atrial fibrillation: Secondary | ICD-10-CM | POA: Diagnosis not present

## 2017-02-21 DIAGNOSIS — I4821 Permanent atrial fibrillation: Secondary | ICD-10-CM

## 2017-02-21 DIAGNOSIS — Z86010 Personal history of colonic polyps: Secondary | ICD-10-CM | POA: Diagnosis not present

## 2017-02-21 DIAGNOSIS — Z6841 Body Mass Index (BMI) 40.0 and over, adult: Secondary | ICD-10-CM | POA: Diagnosis not present

## 2017-02-21 DIAGNOSIS — Z9989 Dependence on other enabling machines and devices: Secondary | ICD-10-CM

## 2017-02-21 DIAGNOSIS — L409 Psoriasis, unspecified: Secondary | ICD-10-CM

## 2017-02-21 DIAGNOSIS — F1010 Alcohol abuse, uncomplicated: Secondary | ICD-10-CM | POA: Diagnosis not present

## 2017-02-21 LAB — POCT GLYCOSYLATED HEMOGLOBIN (HGB A1C): HEMOGLOBIN A1C: 6.1

## 2017-02-21 NOTE — Progress Notes (Signed)
   Subjective:    Patient ID: Lee Kim, male    DOB: 07/17/1946, 71 y.o.   MRN: [id number]  HPI He has had a lot going on since last being seen. He is now being treated for atrial fibrillation with Eliquis. Recently had a colonoscopy which did show adenomatous polyps. He will be seen again in 5 years. He is still dealing with his mother in a nursing home who has Alzheimer's. He states he is cutting down on alcohol consumption is now only drinking 3 per night. He uses this as well as clicking as a relaxation. He continues on his CPAP and is having no difficulty with that. He has lost a few pounds stating he has made some dietary changes. He does point to a plaque-like erythematous rash on his right calf that does give some slight discomfort with itching. He states that other family members have psoriasis as well.   Review of Systems     Objective:   Physical Exam Alert and in no distress. Cardiac exam shows an irregular rhythm. Lungs are clear to auscultation. Round 4 cm erythematous dry lesion is noted on the right calf. His medications were reviewed. A1c is 6.1     Assessment & Plan:  Permanent atrial fibrillation (HCC)  Glucose intolerance (impaired glucose tolerance)  History of colonic polyps  Class 3 severe obesity due to excess calories without serious comorbidity with body mass index (BMI) of 40.0 to 44.9 in adult (HCC)  OSA on CPAP  Stress due to illness of family member  Alcohol use disorder, mild, abuse  Psoriasis  He will continue on medications listed in the chart. Encouraged him to cut back on his alcohol consumption by at least one beverage per night. He will continue on his CPAP. Recommend he use cortisone cream on the psoriatic lesion. Encouraged him to continue with his diet and exercise regimen. We then spent a considerable amount of time discussing making sure that he has time for himself. He has been quite busy especially with taking care of his mother  and I encouraged him to also involve his sisters who although they're not in town can certainly be helpful. Recommend returning here in 6 months to keep track of everything especially his alcohol consumption and weight loss. He

## 2017-03-02 DIAGNOSIS — Z961 Presence of intraocular lens: Secondary | ICD-10-CM | POA: Diagnosis not present

## 2017-03-02 DIAGNOSIS — H401131 Primary open-angle glaucoma, bilateral, mild stage: Secondary | ICD-10-CM | POA: Diagnosis not present

## 2017-03-02 DIAGNOSIS — H2512 Age-related nuclear cataract, left eye: Secondary | ICD-10-CM | POA: Diagnosis not present

## 2017-03-25 ENCOUNTER — Other Ambulatory Visit: Payer: Self-pay | Admitting: Cardiology

## 2017-03-25 DIAGNOSIS — I4891 Unspecified atrial fibrillation: Secondary | ICD-10-CM

## 2017-03-29 DIAGNOSIS — Z961 Presence of intraocular lens: Secondary | ICD-10-CM | POA: Diagnosis not present

## 2017-03-29 DIAGNOSIS — H3321 Serous retinal detachment, right eye: Secondary | ICD-10-CM | POA: Diagnosis not present

## 2017-03-29 DIAGNOSIS — H34831 Tributary (branch) retinal vein occlusion, right eye, with macular edema: Secondary | ICD-10-CM | POA: Diagnosis not present

## 2017-03-29 DIAGNOSIS — H353131 Nonexudative age-related macular degeneration, bilateral, early dry stage: Secondary | ICD-10-CM | POA: Diagnosis not present

## 2017-03-29 DIAGNOSIS — H35371 Puckering of macula, right eye: Secondary | ICD-10-CM | POA: Diagnosis not present

## 2017-03-29 DIAGNOSIS — H401132 Primary open-angle glaucoma, bilateral, moderate stage: Secondary | ICD-10-CM | POA: Diagnosis not present

## 2017-04-01 ENCOUNTER — Encounter: Payer: Self-pay | Admitting: Physician Assistant

## 2017-04-01 ENCOUNTER — Ambulatory Visit (INDEPENDENT_AMBULATORY_CARE_PROVIDER_SITE_OTHER): Payer: PPO | Admitting: Physician Assistant

## 2017-04-01 VITALS — BP 128/90 | HR 90 | Ht 73.0 in | Wt 288.0 lb

## 2017-04-01 DIAGNOSIS — I482 Chronic atrial fibrillation, unspecified: Secondary | ICD-10-CM

## 2017-04-01 DIAGNOSIS — I10 Essential (primary) hypertension: Secondary | ICD-10-CM | POA: Diagnosis not present

## 2017-04-01 DIAGNOSIS — G4733 Obstructive sleep apnea (adult) (pediatric): Secondary | ICD-10-CM | POA: Diagnosis not present

## 2017-04-01 MED ORDER — APIXABAN 5 MG PO TABS: 5.0000 mg | ORAL_TABLET | Freq: Two times a day (BID) | ORAL | 5 refills | Status: DC

## 2017-04-01 NOTE — Progress Notes (Signed)
Cardiology Office Note    Date:  04/01/2017   ID:  Lee Kim, DOB 01-25-1946, MRN [id number]  PCP:  [name], MD  Cardiologist:  Dr. Stanford Breed   Chief Complaint  Patient presents with  . Follow-up    seen for Dr. Stanford Breed    History of Present Illness:  Lee Kim is a 71 y.o. male with PMH of HTN, OSA, obesity and chronic atrial fibrillation. He had a nuclear study in June 2014 that showed prior inferior lateral infarct, no ischemia, EF 42%. Echocardiogram in June 2014 showed normal LV function, moderate left atrial enlargement. He was previously diagnosed with atrial fibrillation by his primary care physician. Echocardiogram in November 2017 showed EF 45-50%, moderate to severe LVH, mildly dilated aortic root, mild biatrial enlargement. CTA showed dilated aortic root of 4.2 cm and also 4 cm thyroid nodule. Ultrasound obtained in December 2017 showed goiter and right-sided nodule, biopsy was recommended and follow-up studies one year also recommended. Holter monitors in December 2017 showed atrial fibrillation with 20 beats of wide complex tachycardia felt to be aberrancy. There are no ultrasound in December 2017 showed no aneurysm.  He was last seen by Dr. Stanford Breed on 10/11/2016, he was doing well at the time. He did undergo colonoscopy on 11/23/2016, and had 2 polyps removed, pathology was negative for high grade hyperplasia. He has been doing well since the last office visit. He has no cardiac awareness. His heart rate is controlled without any additional rate control medication. He uses CPAP every night. He denies any chest pain or shortness of breath with exertion. The most strenuous activity he has done in the last weeks was to push lawnmower he did not have any symptom associated with this. He can follow-up in 6 months.     Past Medical History:  Diagnosis Date  . Colonic polyp   . Diverticulitis   . Erythrocytosis   . Hypertension   . Obesity   . OSA  (obstructive sleep apnea)     Past Surgical History:  Procedure Laterality Date  . COLONOSCOPY  03/2003   Dr. Wynetta Emery  . COLONOSCOPY WITH PROPOFOL N/A 11/23/2016   Procedure: COLONOSCOPY WITH PROPOFOL;  Surgeon: Garlan Fair, MD;  Location: [facility];  Service: Endoscopy;  Laterality: N/A;  . EYE SURGERY      Current Medications: Outpatient Medications Prior to Visit  Medication Sig Dispense Refill  . amLODipine (NORVASC) 10 MG tablet Take 1 tablet (10 mg total) by mouth daily. 90 tablet 3  . cromolyn (NASALCROM) 5.2 MG/ACT nasal spray Place 1 spray into both nostrils 4 (four) times daily.    Marland Kitchen lisinopril-hydrochlorothiazide (PRINZIDE,ZESTORETIC) 10-12.5 MG tablet Take 1 tablet by mouth daily. 90 tablet 3  . Multiple Vitamins-Minerals (PRESERVISION AREDS 2 PO) Take 1 tablet by mouth 2 (two) times daily.     . tadalafil (CIALIS) 20 MG tablet TAKE 1 TABLET EVERY DAY AS NEEDED 5 tablet 6  . TRAVATAN Z 0.004 % SOLN ophthalmic solution Place 1 drop into the right eye at bedtime.   12  . ELIQUIS 5 MG TABS tablet TAKE 1 TABLET(5 MG) BY MOUTH TWICE DAILY 60 tablet 0   No facility-administered medications prior to visit.      Allergies:   Benzalkonium chloride   Social History   Social History  . Marital status: Married    Spouse name: N/A  . Number of children: 1  . Years of education: N/A   Social History Main  Topics  . Smoking status: Former Research scientist (life sciences)  . Smokeless tobacco: Never Used  . Alcohol use 4.2 oz/week    7 Glasses of wine per week     Comment: 3-4 glasses wine per night  . Drug use: No  . Sexual activity: Yes   Other Topics Concern  . None   Social History Narrative  . None     Family History:  The patient's family history includes Hypertension in his father, mother, and sister.   ROS:   Please see the history of present illness.    ROS All other systems reviewed and are negative.   PHYSICAL EXAM:   VS:  BP 128/90 (BP Location: Right Arm, Patient  Position: Sitting, Cuff Size: Large)   Pulse 90   Ht 6\' 1"  (1.854 m)   Wt 288 lb (130.6 kg)   BMI 38.00 kg/m    GEN: Well nourished, well developed, in no acute distress  HEENT: normal  Neck: no JVD, carotid bruits, or masses Cardiac: irregularly irregular; no murmurs, rubs, or gallops,no edema  Respiratory:  clear to auscultation bilaterally, normal work of breathing GI: soft, nontender, nondistended, + BS MS: no deformity or atrophy  Skin: warm and dry, no rash Neuro:  Alert and Oriented x 3, Strength and sensation are intact Psych: euthymic mood, full affect  Wt Readings from Last 3 Encounters:  04/01/17 288 lb (130.6 kg)  02/21/17 288 lb (130.6 kg)  11/23/16 295 lb (133.8 kg)      Studies/Labs Reviewed:   EKG:  EKG is ordered today.  The ekg ordered today demonstrates Atrial fibrillation with right bundle branch block, heart rate controlled.  Recent Labs: 08/11/2016: ALT 33; TSH 0.63 10/11/2016: Hemoglobin 17.7; Platelets 179 10/29/2016: BUN 17; Creat 1.21; Potassium 3.8; Sodium 141   Lipid Panel    Component Value Date/Time   CHOL 164 08/11/2016 0845   TRIG 99 08/11/2016 0845   HDL 43 08/11/2016 0845   CHOLHDL 3.8 08/11/2016 0845   VLDL 20 08/11/2016 0845   LDLCALC 101 (H) 08/11/2016 0845    Additional studies/ records that were reviewed today include:   Echo 08/31/2016 LV EF: 45% -   50%  Study Conclusions  - Left ventricle: The cavity size was normal. There was moderate to   severe concentric hypertrophy. Systolic function was mildly   reduced. The estimated ejection fraction was in the range of 45%   to 50%. Diffuse hypokinesis. The study is not technically   sufficient to allow evaluation of LV diastolic function. - Aorta: Aortic root dimension: 40 mm (ED). Ascending aortic   diameter: 41 mm (S). - Aortic root: The aortic root is dilated. - Ascending aorta: The ascending aorta is dilated. - Mitral valve: Mildly thickened leaflets . There was  trivial   regurgitation. - Left atrium: The atrium was mildly dilated. - Right atrium: The atrium was mildly dilated. - Tricuspid valve: There was trivial regurgitation. - Pulmonary arteries: PA peak pressure: 25 mm Hg (S). - Inferior vena cava: The vessel was normal in size. The   respirophasic diameter changes were in the normal range (>= 50%),   consistent with normal central venous pressure.  Impressions:  - LVEF 45-50%, diffuse hypokinesis, moderate to severe LVH, dilated   aorta up to 4.1 cm, mild biatrial enlargment, trivial TR, normal   RVSP, normal IVC.   48 hour holter 09/03/2016 Afib with PVCs or aberrantly conducted beats; run of WCT (20 beats) likely aberrancy  ASSESSMENT:  1. Chronic atrial fibrillation ([place])   2. Essential hypertension   3. OSA (obstructive sleep apnea)      PLAN:  In order of problems listed above:  1. Chronic atrial fibrillation: on eliquis  - self rate controlled. He has been compliant on eliquis. Based on EKG, he has been in persistent atrial fibrillation since January. By this point, it is chronic atrial fibrillation.  2. HTN: Blood pressure very well controlled on current medication.  3. OSA on CPAP: He has been compliant with CPAP every day.    Medication Adjustments/Labs and Tests Ordered: Current medicines are reviewed at length with the patient today.  Concerns regarding medicines are outlined above.  Medication changes, Labs and Tests ordered today are listed in the Patient Instructions below. Patient Instructions  Medication Instructions:   No changes  Labwork:   none  Testing/Procedures:  none  Follow-Up:  6 months with Dr. Stanford Breed   If you need a refill on your cardiac medications before your next appointment, please call your pharmacy.      Hilbert Corrigan, [place]  04/01/2017 8:28 AM    [address], [place], [place]  [address] Phone: [phone]; Fax: [phone]

## 2017-04-01 NOTE — Patient Instructions (Signed)
Medication Instructions:   No changes  Labwork:   none  Testing/Procedures:  none  Follow-Up:  6 months with Dr. Crenshaw  If you need a refill on your cardiac medications before your next appointment, please call your pharmacy.   

## 2017-04-18 NOTE — Anesthesia Postprocedure Evaluation (Deleted)
Anesthesia Post Note  Patient: Lee Kim  Procedure(s) Performed: Procedure(s) (LRB): COLONOSCOPY WITH PROPOFOL (N/A)     Anesthesia Post Evaluation  Last Vitals:  Vitals:   11/23/16 1110 11/23/16 1120  BP: 134/87 121/86  Pulse: 89 (!) 59  Resp: 18 (!) 23  Temp: 36.4 C     Last Pain:  Vitals:   11/23/16 1110  TempSrc: Oral                 Tykiera Raven,E. Wayburn Shaler

## 2017-04-18 NOTE — Addendum Note (Signed)
Addendum  created 04/18/17 1540 by Annye Asa, MD   Sign clinical note

## 2017-04-18 NOTE — Addendum Note (Signed)
Addendum  created 04/18/17 1622 by Annye Asa, MD   Delete clinical note, Sign clinical note

## 2017-04-25 ENCOUNTER — Other Ambulatory Visit: Payer: Self-pay | Admitting: Cardiology

## 2017-04-25 DIAGNOSIS — I4891 Unspecified atrial fibrillation: Secondary | ICD-10-CM

## 2017-08-05 ENCOUNTER — Other Ambulatory Visit: Payer: Self-pay | Admitting: Family Medicine

## 2017-08-05 DIAGNOSIS — I10 Essential (primary) hypertension: Secondary | ICD-10-CM

## 2017-08-08 NOTE — Telephone Encounter (Signed)
Pt has appt 11/21. Must keep for further refills. Lee Kim December

## 2017-08-12 ENCOUNTER — Other Ambulatory Visit: Payer: Self-pay | Admitting: Family Medicine

## 2017-08-12 DIAGNOSIS — N529 Male erectile dysfunction, unspecified: Secondary | ICD-10-CM

## 2017-08-24 ENCOUNTER — Encounter: Payer: PPO | Admitting: Family Medicine

## 2017-08-29 NOTE — Progress Notes (Deleted)
HPI: FU atrial fibrillation. 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 Ejection fraction 42%. Patient previously seen by primary care for routine physical and noted to be in atrial fibrillation. Echo 11/17 showed EF 45-50, moderate to severe LVH, mildly dilated aortic root, mild biatrial enlargement. CTA showed dilated aortic root (4.2 cm) and 4 cm thyroid nodule. Ultrasound 12/17 showed goiter and right side nodules (bx recommended and fu study one year recommended). Primary care now following. Holter 12/17 showed afib with 20 beats WCT felt to be aberrancy. Abd ultrasound 12/17 showed no aneurysm. Since last seen,   Current Outpatient Medications  Medication Sig Dispense Refill  . amLODipine (NORVASC) 10 MG tablet TAKE 1 TABLET(10 MG) BY MOUTH DAILY 90 tablet 0  . apixaban (ELIQUIS) 5 MG TABS tablet Take 1 tablet (5 mg total) by mouth 2 (two) times daily. 60 tablet 5  . cromolyn (NASALCROM) 5.2 MG/ACT nasal spray Place 1 spray into both nostrils 4 (four) times daily.    Marland Kitchen ELIQUIS 5 MG TABS tablet TAKE 1 TABLET(5 MG) BY MOUTH TWICE DAILY 60 tablet 1  . lisinopril-hydrochlorothiazide (PRINZIDE,ZESTORETIC) 10-12.5 MG tablet Take 1 tablet by mouth daily. 90 tablet 3  . Multiple Vitamins-Minerals (PRESERVISION AREDS 2 PO) Take 1 tablet by mouth 2 (two) times daily.     . tadalafil (CIALIS) 20 MG tablet TAKE 1 TABLET BY MOUTH EVERY DAY AS NEEDED 5 tablet 5  . TRAVATAN Z 0.004 % SOLN ophthalmic solution Place 1 drop into the right eye at bedtime.   12   No current facility-administered medications for this visit.      Past Medical History:  Diagnosis Date  . Colonic polyp   . Diverticulitis   . Erythrocytosis   . Hypertension   . Obesity   . OSA (obstructive sleep apnea)     Past Surgical History:  Procedure Laterality Date  . COLONOSCOPY  03/2003   Dr. Wynetta Emery  . COLONOSCOPY WITH PROPOFOL N/A 11/23/2016   Procedure: COLONOSCOPY WITH  PROPOFOL;  Surgeon: Garlan Fair, MD;  Location: [facility];  Service: Endoscopy;  Laterality: N/A;  . EYE SURGERY      Social History   Socioeconomic History  . Marital status: Married    Spouse name: Not on file  . Number of children: 1  . Years of education: Not on file  . Highest education level: Not on file  Social Needs  . Financial resource strain: Not on file  . Food insecurity - worry: Not on file  . Food insecurity - inability: Not on file  . Transportation needs - medical: Not on file  . Transportation needs - non-medical: Not on file  Occupational History  . Not on file  Tobacco Use  . Smoking status: Former Research scientist (life sciences)  . Smokeless tobacco: Never Used  Substance and Sexual Activity  . Alcohol use: Yes    Alcohol/week: 4.2 oz    Types: 7 Glasses of wine per week    Comment: 3-4 glasses wine per night  . Drug use: No  . Sexual activity: Yes  Other Topics Concern  . Not on file  Social History Narrative  . Not on file    Family History  Problem Relation Age of Onset  . Hypertension Mother   . Hypertension Father   . Hypertension Sister     ROS: no fevers or chills, productive cough, hemoptysis, dysphasia, odynophagia, melena, hematochezia, dysuria, hematuria, rash, seizure activity, orthopnea,  PND, pedal edema, claudication. Remaining systems are negative.  Physical Exam: Well-developed well-nourished in no acute distress.  Skin is warm and dry.  HEENT is normal.  Neck is supple.  Chest is clear to auscultation with normal expansion.  Cardiovascular exam is regular rate and rhythm.  Abdominal exam nontender or distended. No masses palpated. Extremities show no edema. neuro grossly intact  ECG- personally reviewed  A/P  1  Kirk Ruths, MD

## 2017-09-02 ENCOUNTER — Encounter: Payer: Self-pay | Admitting: Cardiology

## 2017-09-12 ENCOUNTER — Ambulatory Visit: Payer: PPO | Admitting: Cardiology

## 2017-09-15 ENCOUNTER — Other Ambulatory Visit: Payer: Self-pay | Admitting: *Deleted

## 2017-09-15 ENCOUNTER — Telehealth: Payer: Self-pay | Admitting: *Deleted

## 2017-09-15 DIAGNOSIS — I712 Thoracic aortic aneurysm, without rupture, unspecified: Secondary | ICD-10-CM

## 2017-09-15 NOTE — Telephone Encounter (Signed)
Left message for pt to call, it is time for patient to have a CTA to f/u his thoracic aneurysm. Order placed for the [address] location and order for bmp placed and will mailed to the patient.

## 2017-09-29 ENCOUNTER — Ambulatory Visit: Payer: PPO | Admitting: Family Medicine

## 2017-09-29 ENCOUNTER — Encounter: Payer: Self-pay | Admitting: Family Medicine

## 2017-09-29 VITALS — BP 142/80 | HR 77 | Resp 16 | Wt 283.0 lb

## 2017-09-29 DIAGNOSIS — L409 Psoriasis, unspecified: Secondary | ICD-10-CM

## 2017-09-29 DIAGNOSIS — Z6841 Body Mass Index (BMI) 40.0 and over, adult: Secondary | ICD-10-CM

## 2017-09-29 DIAGNOSIS — F1010 Alcohol abuse, uncomplicated: Secondary | ICD-10-CM

## 2017-09-29 DIAGNOSIS — I10 Essential (primary) hypertension: Secondary | ICD-10-CM

## 2017-09-29 DIAGNOSIS — Z860100 Personal history of colon polyps, unspecified: Secondary | ICD-10-CM

## 2017-09-29 DIAGNOSIS — R7302 Impaired glucose tolerance (oral): Secondary | ICD-10-CM

## 2017-09-29 DIAGNOSIS — B079 Viral wart, unspecified: Secondary | ICD-10-CM

## 2017-09-29 DIAGNOSIS — J309 Allergic rhinitis, unspecified: Secondary | ICD-10-CM

## 2017-09-29 DIAGNOSIS — G4733 Obstructive sleep apnea (adult) (pediatric): Secondary | ICD-10-CM

## 2017-09-29 DIAGNOSIS — N529 Male erectile dysfunction, unspecified: Secondary | ICD-10-CM

## 2017-09-29 DIAGNOSIS — Z7189 Other specified counseling: Secondary | ICD-10-CM

## 2017-09-29 DIAGNOSIS — I482 Chronic atrial fibrillation, unspecified: Secondary | ICD-10-CM

## 2017-09-29 DIAGNOSIS — Z9989 Dependence on other enabling machines and devices: Secondary | ICD-10-CM

## 2017-09-29 LAB — COMPREHENSIVE METABOLIC PANEL
AG Ratio: 1.5 (calc) (ref 1.0–2.5)
ALBUMIN MSPROF: 3.7 g/dL (ref 3.6–5.1)
ALT: 19 U/L (ref 9–46)
AST: 17 U/L (ref 10–35)
Alkaline phosphatase (APISO): 75 U/L (ref 40–115)
BUN: 24 mg/dL (ref 7–25)
CHLORIDE: 107 mmol/L (ref 98–110)
CO2: 25 mmol/L (ref 20–32)
CREATININE: 1.17 mg/dL (ref 0.70–1.25)
Calcium: 9 mg/dL (ref 8.6–10.3)
GLOBULIN: 2.4 g/dL (ref 1.9–3.7)
GLUCOSE: 122 mg/dL — AB (ref 65–99)
POTASSIUM: 3.8 mmol/L (ref 3.5–5.3)
Sodium: 141 mmol/L (ref 135–146)
TOTAL PROTEIN: 6.1 g/dL (ref 6.1–8.1)
Total Bilirubin: 0.7 mg/dL (ref 0.2–1.2)

## 2017-09-29 LAB — LIPID PANEL
Cholesterol: 153 mg/dL (ref <200)
HDL: 48 mg/dL (ref >40)
LDL Cholesterol (Calc): 85 mg/dL (calc)
NON-HDL CHOLESTEROL (CALC): 105 mg/dL (ref <130)
Total CHOL/HDL Ratio: 3.2 (calc) (ref <5.0)
Triglycerides: 102 mg/dL (ref <150)

## 2017-09-29 LAB — CBC WITH DIFFERENTIAL/PLATELET
BASOS PCT: 1.1 %
Basophils Absolute: 89 cells/uL (ref 0–200)
Eosinophils Absolute: 203 cells/uL (ref 15–500)
Eosinophils Relative: 2.5 %
HCT: 53.9 % — ABNORMAL HIGH (ref 38.5–50.0)
Hemoglobin: 18.5 g/dL — ABNORMAL HIGH (ref 13.2–17.1)
Lymphs Abs: 1766 cells/uL (ref 850–3900)
MCH: 31.6 pg (ref 27.0–33.0)
MCHC: 34.3 g/dL (ref 32.0–36.0)
MCV: 92 fL (ref 80.0–100.0)
MONOS PCT: 11.4 %
MPV: 12.5 fL (ref 7.5–12.5)
Neutro Abs: 5119 cells/uL (ref 1500–7800)
Neutrophils Relative %: 63.2 %
PLATELETS: 170 10*3/uL (ref 140–400)
RBC: 5.86 10*6/uL — AB (ref 4.20–5.80)
RDW: 12.4 % (ref 11.0–15.0)
TOTAL LYMPHOCYTE: 21.8 %
WBC mixed population: 923 cells/uL (ref 200–950)
WBC: 8.1 10*3/uL (ref 3.8–10.8)

## 2017-09-29 LAB — POCT GLYCOSYLATED HEMOGLOBIN (HGB A1C): HEMOGLOBIN A1C: 6

## 2017-09-29 MED ORDER — LISINOPRIL-HYDROCHLOROTHIAZIDE 10-12.5 MG PO TABS: 1.0000 | ORAL_TABLET | Freq: Every day | ORAL | 3 refills | Status: DC

## 2017-09-29 MED ORDER — TADALAFIL 20 MG PO TABS: ORAL_TABLET | ORAL | 5 refills | Status: DC

## 2017-09-29 MED ORDER — TRIAMCINOLONE ACETONIDE 0.1 % EX CREA: 1.0000 "application " | TOPICAL_CREAM | Freq: Two times a day (BID) | CUTANEOUS | 5 refills | Status: DC

## 2017-09-29 MED ORDER — AMLODIPINE BESYLATE 10 MG PO TABS: ORAL_TABLET | ORAL | 3 refills | Status: DC

## 2017-09-29 NOTE — Progress Notes (Addendum)
Patient ID: Lee Kim, male    DOB: June 29, 1946, 71 y.o.   MRN: [id number]  HPI He is here for an interval evaluation.  His mother died on Sep 27, 2023.  He is still in the process of getting all of her affairs in order.  This is been a very stressful last several years.  He also then mentioned the fact that his brother was killed about 20 years ago and he still has issues with this. He does have OSA and is using his CPAP regularly.  He has not had a read out recently.  He also has difficulty with psoriasis and would like a lotion to put on this. He does have underlying atrial fibrillation and continues on Eliquis.  He has an appointment with cardiology in the near future. He also has a history of colonic polyps and has had a recent colonoscopy. He does have underlying ED and like a refill on Cialis.  He will try to get it locally or possibly from [place]. He admits to not exercising regularly and is concerned about his blood sugar.  He does have a history of glucose intolerance.  He also continues on his blood pressure medication.  He is not been able to exercise like he would like.  Admits to drinking to help deal with his anxiety.  Review of Systems     Objective:   Physical Exam Alert and in no distress.  Small wart noted on the upper mid eyelash area on the right tympanic membranes and canals are normal. Pharyngeal area is normal. Neck is supple without adenopathy or thyromegaly. Cardiac exam shows an irregular rhythm without murmurs or gallops. Lungs are clear to auscultation. A1c is 6.0       Assessment & Plan:  Chronic allergic rhinitis  Glucose intolerance (impaired glucose tolerance) - Plan: CBC with Differential/Platelet, Comprehensive metabolic panel, Lipid panel, HgB A1c, CANCELED: HgB A1c  OSA on CPAP  Essential hypertension - Plan: CBC with Differential/Platelet, Comprehensive metabolic panel, lisinopril-hydrochlorothiazide (PRINZIDE,ZESTORETIC) 10-12.5 MG  tablet, amLODipine (NORVASC) 10 MG tablet  Class 3 severe obesity due to excess calories without serious comorbidity with body mass index (BMI) of 40.0 to 44.9 in adult [facility]) - Plan: CBC with Differential/Platelet, Comprehensive metabolic panel, Lipid panel  Chronic atrial fibrillation (HCC)  History of colonic polyps  Bereavement counseling  Erectile dysfunction, unspecified erectile dysfunction type - Plan: tadalafil (CIALIS) 20 MG tablet  Alcohol use disorder, mild, abuse  Psoriasis - Plan: triamcinolone cream (KENALOG) 0.1 % I discussed him possibly getting involved in counseling however at this point he is not overly interested in doing that.  Also discussed cutting back on alcohol consumption to 1 or 2 beverages.  He will keep me informed as to the benefits of Cialis and where he gets this.  He will follow-up with cardiology. Discussed risk for diabetes concerning his A1c, weight, lack of exercise. Recommend he continue on his allergy medicines. Also recommended ophthalmology referral to remove the wart although he was not interested.

## 2017-09-29 NOTE — Progress Notes (Deleted)
   Subjective:    Patient ID: Lee Kim, male    DOB: 1946-05-03, 71 y.o.   MRN: [id number]  HPI He is here for an interval evaluation.   Review of Systems     Objective:   Physical Exam        Assessment & Plan:

## 2017-10-03 NOTE — Telephone Encounter (Signed)
CTA has been scheduled. °

## 2017-10-13 ENCOUNTER — Ambulatory Visit (INDEPENDENT_AMBULATORY_CARE_PROVIDER_SITE_OTHER)
Admission: RE | Admit: 2017-10-13 | Discharge: 2017-10-13 | Disposition: A | Discharge status: Home / Self Care | Payer: PPO | Source: Ambulatory Visit | Attending: Cardiology | Admitting: Cardiology

## 2017-10-13 DIAGNOSIS — I712 Thoracic aortic aneurysm, without rupture, unspecified: Secondary | ICD-10-CM

## 2017-10-13 MED ORDER — IOPAMIDOL (ISOVUE-370) INJECTION 76%
100.0000 mL | Freq: Once | INTRAVENOUS | Status: AC | PRN
  Administered 2017-10-13: 100 mL via INTRAVENOUS

## 2017-10-18 DIAGNOSIS — H353131 Nonexudative age-related macular degeneration, bilateral, early dry stage: Secondary | ICD-10-CM | POA: Diagnosis not present

## 2017-10-18 DIAGNOSIS — H35371 Puckering of macula, right eye: Secondary | ICD-10-CM | POA: Diagnosis not present

## 2017-10-18 DIAGNOSIS — Z961 Presence of intraocular lens: Secondary | ICD-10-CM | POA: Diagnosis not present

## 2017-10-18 DIAGNOSIS — H2512 Age-related nuclear cataract, left eye: Secondary | ICD-10-CM | POA: Diagnosis not present

## 2017-10-18 DIAGNOSIS — H401131 Primary open-angle glaucoma, bilateral, mild stage: Secondary | ICD-10-CM | POA: Diagnosis not present

## 2017-10-21 NOTE — Progress Notes (Signed)
HPI: FU atrial fibrillation. 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 Ejection fraction 42%. Echocardiogram June 2014 showed normal LV systolic function and moderate left atrial enlargement. Patient previously seen by primary care for routine physical and noted to be in atrial fibrillation. Echo 11/17 showed EF 45-50, moderate to severe LVH, mildly dilated aortic root, mild biatrial enlargement. CTA showed dilated aortic root (4.2 cm) and 4 cm thyroid nodule. Holter 12/17 showed afib with 20 beats WCT felt to be aberrancy. Abd ultrasound 12/17 showed no aneurysm. 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 Since last seen, patient denies dyspnea, exertional chest pain, palpitations or syncope.  No bleeding.  Current Outpatient Medications  Medication Sig Dispense Refill  . amLODipine (NORVASC) 10 MG tablet TAKE 1 TABLET(10 MG) BY MOUTH DAILY 90 tablet 3  . cromolyn (NASALCROM) 5.2 MG/ACT nasal spray Place 1 spray into both nostrils 4 (four) times daily.    Marland Kitchen ELIQUIS 5 MG TABS tablet TAKE 1 TABLET(5 MG) BY MOUTH TWICE DAILY 60 tablet 1  . lisinopril-hydrochlorothiazide (PRINZIDE,ZESTORETIC) 10-12.5 MG tablet Take 1 tablet by mouth daily. 90 tablet 3  . Multiple Vitamins-Minerals (PRESERVISION AREDS 2 PO) Take 1 tablet by mouth 2 (two) times daily.     . tadalafil (CIALIS) 20 MG tablet TAKE 1 TABLET BY MOUTH EVERY DAY AS NEEDED 24 tablet 5  . TRAVATAN Z 0.004 % SOLN ophthalmic solution Place 1 drop into the right eye at bedtime.   12  . triamcinolone cream (KENALOG) 0.1 % Apply 1 application topically 2 (two) times daily. 45 g 5   No current facility-administered medications for this visit.      Past Medical History:  Diagnosis Date  . Colonic polyp   . Diverticulitis   . Erythrocytosis   . Hypertension   . Obesity   . OSA (obstructive sleep apnea)     Past Surgical History:  Procedure Laterality  Date  . COLONOSCOPY  03/2003   Dr. Wynetta Emery  . COLONOSCOPY WITH PROPOFOL N/A 11/23/2016   Procedure: COLONOSCOPY WITH PROPOFOL;  Surgeon: Garlan Fair, MD;  Location: [facility];  Service: Endoscopy;  Laterality: N/A;  . EYE SURGERY      Social History   Socioeconomic History  . Marital status: Married    Spouse name: Not on file  . Number of children: 1  . Years of education: Not on file  . Highest education level: Not on file  Social Needs  . Financial resource strain: Not on file  . Food insecurity - worry: Not on file  . Food insecurity - inability: Not on file  . Transportation needs - medical: Not on file  . Transportation needs - non-medical: Not on file  Occupational History  . Not on file  Tobacco Use  . Smoking status: Former Research scientist (life sciences)  . Smokeless tobacco: Never Used  Substance and Sexual Activity  . Alcohol use: Yes    Alcohol/week: 4.2 oz    Types: 7 Glasses of wine per week    Comment: 3-4 glasses wine per night  . Drug use: No  . Sexual activity: Yes  Other Topics Concern  . Not on file  Social History Narrative  . Not on file    Family History  Problem Relation Age of Onset  . Hypertension Mother   . Hypertension Father   . Hypertension Sister     ROS: no fevers or chills,  productive cough, hemoptysis, dysphasia, odynophagia, melena, hematochezia, dysuria, hematuria, rash, seizure activity, orthopnea, PND, pedal edema, claudication. Remaining systems are negative.  Physical Exam: Well-developed obese in no acute distress.  Skin is warm and dry.  HEENT is normal.  Neck is supple.  Chest is clear to auscultation with normal expansion.  Cardiovascular exam is irregular Abdominal exam nontender or distended. No masses palpated. Extremities show no edema. neuro grossly intact  ECG- personally reviewed  A/P  1 permanent atrial fibrillation-patient continues to do well from a symptomatic standpoint.  Duration of atrial fibrillation is unknown  and we have elected rate control and anticoagulation.  His rate is controlled on no medications.  Continue apixaban.    2 thoracic aortic aneurysm-follow-up CTA January 2020.  3 hypertension-blood pressure mildly elevated.  We will follow and advance regimen as needed.  4 tobacco abuse-patient counseled on discontinuing.  5 alcohol use-patient counseled on reducing.  Kirk Ruths, MD

## 2017-10-28 ENCOUNTER — Ambulatory Visit: Payer: PPO | Admitting: Cardiology

## 2017-10-28 ENCOUNTER — Encounter: Payer: Self-pay | Admitting: Cardiology

## 2017-10-28 VITALS — BP 144/81 | HR 81 | Ht 73.0 in | Wt 282.0 lb

## 2017-10-28 DIAGNOSIS — I482 Chronic atrial fibrillation: Secondary | ICD-10-CM

## 2017-10-28 DIAGNOSIS — I77810 Thoracic aortic ectasia: Secondary | ICD-10-CM | POA: Diagnosis not present

## 2017-10-28 DIAGNOSIS — I10 Essential (primary) hypertension: Secondary | ICD-10-CM

## 2017-10-28 DIAGNOSIS — I4821 Permanent atrial fibrillation: Secondary | ICD-10-CM

## 2017-10-28 NOTE — Patient Instructions (Signed)
Your physician wants you to follow-up in: ONE YEAR WITH DR CRENSHAW You will receive a reminder letter in the mail two months in advance. If you don't receive a letter, please call our office to schedule the follow-up appointment.   If you need a refill on your cardiac medications before your next appointment, please call your pharmacy.  

## 2017-11-01 ENCOUNTER — Other Ambulatory Visit: Payer: Self-pay | Admitting: Family Medicine

## 2017-11-01 DIAGNOSIS — I10 Essential (primary) hypertension: Secondary | ICD-10-CM

## 2017-11-02 DIAGNOSIS — H401131 Primary open-angle glaucoma, bilateral, mild stage: Secondary | ICD-10-CM | POA: Diagnosis not present

## 2017-11-09 ENCOUNTER — Other Ambulatory Visit: Payer: Self-pay | Admitting: Family Medicine

## 2017-11-09 DIAGNOSIS — I10 Essential (primary) hypertension: Secondary | ICD-10-CM

## 2017-11-10 ENCOUNTER — Other Ambulatory Visit: Payer: Self-pay | Admitting: Physician Assistant

## 2017-11-10 DIAGNOSIS — I482 Chronic atrial fibrillation, unspecified: Secondary | ICD-10-CM

## 2017-11-10 NOTE — Telephone Encounter (Signed)
Please review for refill, Thanks !  

## 2017-11-14 NOTE — Telephone Encounter (Signed)
Please review for refill, thanks ! 

## 2017-12-28 DIAGNOSIS — H401131 Primary open-angle glaucoma, bilateral, mild stage: Secondary | ICD-10-CM | POA: Diagnosis not present

## 2018-01-07 ENCOUNTER — Other Ambulatory Visit: Payer: Self-pay | Admitting: Cardiology

## 2018-01-07 DIAGNOSIS — I4891 Unspecified atrial fibrillation: Secondary | ICD-10-CM

## 2018-06-21 DIAGNOSIS — H401131 Primary open-angle glaucoma, bilateral, mild stage: Secondary | ICD-10-CM | POA: Diagnosis not present

## 2018-07-03 ENCOUNTER — Other Ambulatory Visit: Payer: Self-pay | Admitting: Cardiology

## 2018-07-03 DIAGNOSIS — I482 Chronic atrial fibrillation, unspecified: Secondary | ICD-10-CM

## 2018-07-14 DIAGNOSIS — H01022 Squamous blepharitis right lower eyelid: Secondary | ICD-10-CM | POA: Diagnosis not present

## 2018-07-14 DIAGNOSIS — H01021 Squamous blepharitis right upper eyelid: Secondary | ICD-10-CM | POA: Diagnosis not present

## 2018-07-14 DIAGNOSIS — H1011 Acute atopic conjunctivitis, right eye: Secondary | ICD-10-CM | POA: Diagnosis not present

## 2018-07-14 DIAGNOSIS — H01024 Squamous blepharitis left upper eyelid: Secondary | ICD-10-CM | POA: Diagnosis not present

## 2018-07-14 DIAGNOSIS — H01025 Squamous blepharitis left lower eyelid: Secondary | ICD-10-CM | POA: Diagnosis not present

## 2018-07-14 DIAGNOSIS — H01113 Allergic dermatitis of right eye, unspecified eyelid: Secondary | ICD-10-CM | POA: Diagnosis not present

## 2018-07-17 DIAGNOSIS — H01113 Allergic dermatitis of right eye, unspecified eyelid: Secondary | ICD-10-CM | POA: Diagnosis not present

## 2018-07-20 DIAGNOSIS — H401131 Primary open-angle glaucoma, bilateral, mild stage: Secondary | ICD-10-CM | POA: Diagnosis not present

## 2018-08-10 DIAGNOSIS — H401131 Primary open-angle glaucoma, bilateral, mild stage: Secondary | ICD-10-CM | POA: Diagnosis not present

## 2018-09-20 DIAGNOSIS — H401131 Primary open-angle glaucoma, bilateral, mild stage: Secondary | ICD-10-CM | POA: Diagnosis not present

## 2018-10-03 ENCOUNTER — Other Ambulatory Visit: Payer: Self-pay | Admitting: *Deleted

## 2018-10-03 DIAGNOSIS — I712 Thoracic aortic aneurysm, without rupture, unspecified: Secondary | ICD-10-CM

## 2018-10-12 ENCOUNTER — Other Ambulatory Visit: Payer: Self-pay | Admitting: *Deleted

## 2018-10-12 DIAGNOSIS — I712 Thoracic aortic aneurysm, without rupture, unspecified: Secondary | ICD-10-CM

## 2018-10-24 ENCOUNTER — Other Ambulatory Visit: Payer: Self-pay | Admitting: Family Medicine

## 2018-10-24 ENCOUNTER — Telehealth: Payer: Self-pay

## 2018-10-24 DIAGNOSIS — L409 Psoriasis, unspecified: Secondary | ICD-10-CM

## 2018-10-24 DIAGNOSIS — I10 Essential (primary) hypertension: Secondary | ICD-10-CM

## 2018-10-24 MED ORDER — AMLODIPINE BESYLATE 10 MG PO TABS: ORAL_TABLET | ORAL | 1 refills | Status: DC

## 2018-10-24 NOTE — Telephone Encounter (Signed)
He needs a med check appointment. 

## 2018-10-24 NOTE — Telephone Encounter (Signed)
Called pt to advise appt is needed. LVM due to no answer. [place]

## 2018-10-24 NOTE — Telephone Encounter (Signed)
Pt was requesting to fill pt psoriasis med . Please advise [facility]

## 2018-10-24 NOTE — Telephone Encounter (Signed)
He made an mwv but will be heading outnof town for the birth grandchild. [place]

## 2018-10-25 MED ORDER — TRIAMCINOLONE ACETONIDE 0.1 % EX CREA: 1.0000 "application " | TOPICAL_CREAM | Freq: Two times a day (BID) | CUTANEOUS | 5 refills | Status: DC

## 2018-11-01 ENCOUNTER — Other Ambulatory Visit: Payer: Self-pay | Admitting: *Deleted

## 2018-11-01 DIAGNOSIS — I712 Thoracic aortic aneurysm, without rupture, unspecified: Secondary | ICD-10-CM

## 2018-11-01 LAB — BASIC METABOLIC PANEL
BUN / CREAT RATIO: 17 (ref 10–24)
BUN: 22 mg/dL (ref 8–27)
CO2: 24 mmol/L (ref 20–29)
CREATININE: 1.3 mg/dL — AB (ref 0.76–1.27)
Calcium: 9.4 mg/dL (ref 8.6–10.2)
Chloride: 100 mmol/L (ref 96–106)
GFR, EST AFRICAN AMERICAN: 64 mL/min/{1.73_m2} (ref >59)
GFR, EST NON AFRICAN AMERICAN: 55 mL/min/{1.73_m2} — AB (ref >59)
Glucose: 139 mg/dL — ABNORMAL HIGH (ref 65–99)
Potassium: 4.2 mmol/L (ref 3.5–5.2)
SODIUM: 141 mmol/L (ref 134–144)

## 2018-11-06 ENCOUNTER — Ambulatory Visit (INDEPENDENT_AMBULATORY_CARE_PROVIDER_SITE_OTHER)
Admission: RE | Admit: 2018-11-06 | Discharge: 2018-11-06 | Disposition: A | Discharge status: Home / Self Care | Payer: PPO | Source: Ambulatory Visit | Attending: Cardiology | Admitting: Cardiology

## 2018-11-06 ENCOUNTER — Telehealth: Payer: Self-pay | Admitting: Cardiology

## 2018-11-06 DIAGNOSIS — I712 Thoracic aortic aneurysm, without rupture, unspecified: Secondary | ICD-10-CM

## 2018-11-06 DIAGNOSIS — E041 Nontoxic single thyroid nodule: Secondary | ICD-10-CM

## 2018-11-06 MED ORDER — IOPAMIDOL (ISOVUE-370) INJECTION 76%
100.0000 mL | Freq: Once | INTRAVENOUS | Status: AC | PRN
  Administered 2018-11-06: 100 mL via INTRAVENOUS

## 2018-11-06 NOTE — Telephone Encounter (Signed)
Called patient, gave CT results. Patient was notified of needed [place] for thyroid goiter enlargement. Patient states he has had biopsies, but I advised patient it did increase in size, and they recommended to have it checked again. Patient verbalized understanding.  [place] head and neck ordered for [facility].  Will notify RN.

## 2018-11-06 NOTE — Telephone Encounter (Signed)
  Patient called stating that he is returning a call from [employer]. Attempted to contact nurse.

## 2018-11-07 DIAGNOSIS — H35371 Puckering of macula, right eye: Secondary | ICD-10-CM | POA: Diagnosis not present

## 2018-11-07 DIAGNOSIS — H3321 Serous retinal detachment, right eye: Secondary | ICD-10-CM | POA: Diagnosis not present

## 2018-11-07 DIAGNOSIS — H353131 Nonexudative age-related macular degeneration, bilateral, early dry stage: Secondary | ICD-10-CM | POA: Diagnosis not present

## 2018-11-07 DIAGNOSIS — H401132 Primary open-angle glaucoma, bilateral, moderate stage: Secondary | ICD-10-CM | POA: Diagnosis not present

## 2018-11-07 DIAGNOSIS — Z961 Presence of intraocular lens: Secondary | ICD-10-CM | POA: Diagnosis not present

## 2018-11-07 DIAGNOSIS — H34831 Tributary (branch) retinal vein occlusion, right eye, with macular edema: Secondary | ICD-10-CM | POA: Diagnosis not present

## 2018-11-10 ENCOUNTER — Telehealth: Payer: Self-pay | Admitting: Family Medicine

## 2018-11-10 DIAGNOSIS — N529 Male erectile dysfunction, unspecified: Secondary | ICD-10-CM

## 2018-11-10 MED ORDER — TADALAFIL 20 MG PO TABS: ORAL_TABLET | ORAL | 5 refills | Status: DC

## 2018-11-10 NOTE — Telephone Encounter (Signed)
   Pt wants refill on cialis  [facility]

## 2018-11-14 ENCOUNTER — Ambulatory Visit
Admission: RE | Admit: 2018-11-14 | Discharge: 2018-11-14 | Disposition: A | Discharge status: Home / Self Care | Payer: PPO | Source: Ambulatory Visit | Attending: Cardiology | Admitting: Cardiology

## 2018-11-14 DIAGNOSIS — E042 Nontoxic multinodular goiter: Secondary | ICD-10-CM | POA: Diagnosis not present

## 2018-11-14 DIAGNOSIS — E041 Nontoxic single thyroid nodule: Secondary | ICD-10-CM

## 2018-11-24 ENCOUNTER — Telehealth: Payer: Self-pay | Admitting: Family Medicine

## 2018-11-24 NOTE — Telephone Encounter (Signed)
P.A. TADALAFIL 

## 2018-12-01 NOTE — Progress Notes (Signed)
HPI: FUatrial fibrillation. 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 Ejection fraction 42%. Patientpreviously seen by primary care for routine physical and noted to be in atrial fibrillation. Echo 11/17 showed EF 45-50, moderate to severe LVH, mildly dilated aortic root, mild biatrial enlargement. Holter 12/17 showed afib with 20 beats WCT felt to be aberrancy. Abd ultrasound 12/17 showed no aneurysm. CTA February 2020 showed 4.3 cm ascending thoracic aortic aneurysm.  There was a right-sided hypodense thyroid nodule and thyroid ultrasound recommended.  Thyroid ultrasound February 2020 showed multinodular goiter.  It was noted that nodules on the right and left were grossly unchanged previous biopsy results were benign.  Follow-up ultrasound recommended 1 year.  Since last seen,  patient denies dyspnea, chest pain, palpitations, syncope or bleeding.  Current Outpatient Medications  Medication Sig Dispense Refill  . amLODipine (NORVASC) 10 MG tablet TAKE 1 TABLET(10 MG) BY MOUTH DAILY 90 tablet 0  . amLODipine (NORVASC) 10 MG tablet TAKE 1 TABLET(10 MG) BY MOUTH DAILY 90 tablet 1  . cromolyn (NASALCROM) 5.2 MG/ACT nasal spray Place 1 spray into both nostrils 4 (four) times daily.    Marland Kitchen ELIQUIS 5 MG TABS tablet TAKE 1 TABLET(5 MG) BY MOUTH TWICE DAILY 180 tablet 1  . ELIQUIS 5 MG TABS tablet TAKE 1 TABLET(5 MG) BY MOUTH TWICE DAILY 180 tablet 1  . lisinopril-hydrochlorothiazide (PRINZIDE,ZESTORETIC) 10-12.5 MG tablet TAKE 1 TABLET BY MOUTH DAILY 90 tablet 0  . lisinopril-hydrochlorothiazide (PRINZIDE,ZESTORETIC) 10-12.5 MG tablet TAKE 1 TABLET BY MOUTH DAILY 90 tablet 1  . Multiple Vitamins-Minerals (PRESERVISION AREDS 2 PO) Take 1 tablet by mouth 2 (two) times daily.     . tadalafil (CIALIS) 20 MG tablet TAKE 1 TABLET BY MOUTH EVERY DAY AS NEEDED 24 tablet 5  . TRAVATAN Z 0.004 % SOLN ophthalmic solution Place 1 drop into the right eye at bedtime.   12    . triamcinolone cream (KENALOG) 0.1 % Apply 1 application topically 2 (two) times daily. 45 g 5   No current facility-administered medications for this visit.      Past Medical History:  Diagnosis Date  . Colonic polyp   . Diverticulitis   . Erythrocytosis   . Hypertension   . Obesity   . OSA (obstructive sleep apnea)     Past Surgical History:  Procedure Laterality Date  . COLONOSCOPY  03/2003   Dr. Wynetta Emery  . COLONOSCOPY WITH PROPOFOL N/A 11/23/2016   Procedure: COLONOSCOPY WITH PROPOFOL;  Surgeon: Garlan Fair, MD;  Location: [facility];  Service: Endoscopy;  Laterality: N/A;  . EYE SURGERY      Social History   Socioeconomic History  . Marital status: Married    Spouse name: Not on file  . Number of children: 1  . Years of education: Not on file  . Highest education level: Not on file  Occupational History  . Not on file  Social Needs  . Financial resource strain: Not on file  . Food insecurity:    Worry: Not on file    Inability: Not on file  . Transportation needs:    Medical: Not on file    Non-medical: Not on file  Tobacco Use  . Smoking status: Former Research scientist (life sciences)  . Smokeless tobacco: Never Used  Substance and Sexual Activity  . Alcohol use: Yes    Alcohol/week: 7.0 standard drinks    Types: 7 Glasses of wine per week    Comment: 3-4  glasses wine per night  . Drug use: No  . Sexual activity: Yes  Lifestyle  . Physical activity:    Days per week: Not on file    Minutes per session: Not on file  . Stress: Not on file  Relationships  . Social connections:    Talks on phone: Not on file    Gets together: Not on file    Attends religious service: Not on file    Active member of club or organization: Not on file    Attends meetings of clubs or organizations: Not on file    Relationship status: Not on file  . Intimate partner violence:    Fear of current or ex partner: Not on file    Emotionally abused: Not on file    Physically abused: Not on  file    Forced sexual activity: Not on file  Other Topics Concern  . Not on file  Social History Narrative  . Not on file    Family History  Problem Relation Age of Onset  . Hypertension Mother   . Hypertension Father   . Hypertension Sister     ROS: no fevers or chills, productive cough, hemoptysis, dysphasia, odynophagia, melena, hematochezia, dysuria, hematuria, rash, seizure activity, orthopnea, PND, pedal edema, claudication. Remaining systems are negative.  Physical Exam: Well-developed obese in no acute distress.  Skin is warm and dry.  HEENT is normal.  Neck is supple.  Chest is clear to auscultation with normal expansion.  Cardiovascular exam is irregular Abdominal exam nontender or distended. No masses palpated. Extremities show trace edema. neuro grossly intact  ECG-atrial fibrillation at a rate of 85, left axis deviation, right bundle branch block, nonspecific ST changes.  Personally reviewed  A/P  1 permanent atrial fibrillation-as outlined previously plan is rate control and anticoagulation.  Rate is controlled with no medications.  Continue apixaban. Hgb and renal function monitored by primary care.  2 thoracic aortic aneurysm-plan repeat CTA February 2021.  3 thyroid nodule-repeat thyroid ultrasound February 2021.  4 hypertension-patient's blood pressure is controlled.  Continue present medications and follow.  5 tobacco abuse-patient has been counseled on discontinuing.  6 history of alcohol abuse-now consumes 2 glasses wine per day.  Kirk Ruths, MD

## 2018-12-08 ENCOUNTER — Encounter: Payer: Self-pay | Admitting: Cardiology

## 2018-12-08 ENCOUNTER — Ambulatory Visit: Payer: PPO | Admitting: Cardiology

## 2018-12-08 VITALS — BP 110/70 | HR 85 | Ht 73.0 in | Wt 281.6 lb

## 2018-12-08 DIAGNOSIS — I712 Thoracic aortic aneurysm, without rupture, unspecified: Secondary | ICD-10-CM

## 2018-12-08 DIAGNOSIS — I4821 Permanent atrial fibrillation: Secondary | ICD-10-CM | POA: Diagnosis not present

## 2018-12-08 DIAGNOSIS — I10 Essential (primary) hypertension: Secondary | ICD-10-CM

## 2018-12-08 DIAGNOSIS — E041 Nontoxic single thyroid nodule: Secondary | ICD-10-CM | POA: Diagnosis not present

## 2018-12-08 NOTE — Patient Instructions (Signed)
Medication Instructions:  NO CHANGE If you need a refill on your cardiac medications before your next appointment, please call your pharmacy.   Lab work: If you have labs (blood work) drawn today and your tests are completely normal, you will receive your results only by: Marland Kitchen MyChart Message (if you have MyChart) OR . A paper copy in the mail If you have any lab test that is abnormal or we need to change your treatment, we will call you to review the results.  Follow-Up: At [facility], you and your health needs are our priority.  As part of our continuing mission to provide you with exceptional heart care, we have created designated Provider Care Teams.  These Care Teams include your primary Cardiologist (physician) and Advanced Practice Providers (APPs -  Physician Assistants and Nurse Practitioners) who all work together to provide you with the care you need, when you need it. You will need a follow up appointment in 12 MONTHS.  Please call our office 2 months in advance to schedule this appointment.  You may see Kirk Ruths MD or one of the following Advanced Practice Providers on your designated Care Team:   Kerin Ransom, PA-C Roby Lofts, [place] . Sande Rives, PA-C

## 2018-12-10 NOTE — Telephone Encounter (Signed)
P.A. denied plan exclusion, pt was called earlier & options discussed for cash pay with Good Rx card

## 2019-01-23 ENCOUNTER — Encounter: Payer: Self-pay | Admitting: Family Medicine

## 2019-01-23 ENCOUNTER — Ambulatory Visit (INDEPENDENT_AMBULATORY_CARE_PROVIDER_SITE_OTHER): Payer: PPO | Admitting: Family Medicine

## 2019-01-23 ENCOUNTER — Other Ambulatory Visit: Payer: Self-pay

## 2019-01-23 VITALS — BP 110/74 | Wt 287.0 lb

## 2019-01-23 DIAGNOSIS — G4733 Obstructive sleep apnea (adult) (pediatric): Secondary | ICD-10-CM

## 2019-01-23 DIAGNOSIS — I10 Essential (primary) hypertension: Secondary | ICD-10-CM

## 2019-01-23 DIAGNOSIS — J309 Allergic rhinitis, unspecified: Secondary | ICD-10-CM

## 2019-01-23 DIAGNOSIS — F1010 Alcohol abuse, uncomplicated: Secondary | ICD-10-CM

## 2019-01-23 DIAGNOSIS — D751 Secondary polycythemia: Secondary | ICD-10-CM

## 2019-01-23 DIAGNOSIS — I7121 Aneurysm of the ascending aorta, without rupture: Secondary | ICD-10-CM | POA: Insufficient documentation

## 2019-01-23 DIAGNOSIS — Z13220 Encounter for screening for lipoid disorders: Secondary | ICD-10-CM | POA: Diagnosis not present

## 2019-01-23 DIAGNOSIS — I482 Chronic atrial fibrillation, unspecified: Secondary | ICD-10-CM | POA: Diagnosis not present

## 2019-01-23 DIAGNOSIS — I4821 Permanent atrial fibrillation: Secondary | ICD-10-CM | POA: Diagnosis not present

## 2019-01-23 DIAGNOSIS — E042 Nontoxic multinodular goiter: Secondary | ICD-10-CM

## 2019-01-23 DIAGNOSIS — I712 Thoracic aortic aneurysm, without rupture, unspecified: Secondary | ICD-10-CM

## 2019-01-23 DIAGNOSIS — R7302 Impaired glucose tolerance (oral): Secondary | ICD-10-CM | POA: Diagnosis not present

## 2019-01-23 DIAGNOSIS — Z6841 Body Mass Index (BMI) 40.0 and over, adult: Secondary | ICD-10-CM

## 2019-01-23 DIAGNOSIS — L409 Psoriasis, unspecified: Secondary | ICD-10-CM | POA: Diagnosis not present

## 2019-01-23 DIAGNOSIS — N529 Male erectile dysfunction, unspecified: Secondary | ICD-10-CM

## 2019-01-23 DIAGNOSIS — I700 Atherosclerosis of aorta: Secondary | ICD-10-CM

## 2019-01-23 DIAGNOSIS — Z9989 Dependence on other enabling machines and devices: Secondary | ICD-10-CM

## 2019-01-23 MED ORDER — AMLODIPINE BESYLATE 10 MG PO TABS: ORAL_TABLET | ORAL | 3 refills | Status: DC

## 2019-01-23 MED ORDER — TRIAMCINOLONE ACETONIDE 0.1 % EX CREA: 1.0000 "application " | TOPICAL_CREAM | Freq: Two times a day (BID) | CUTANEOUS | 5 refills | Status: DC

## 2019-01-23 MED ORDER — TADALAFIL 20 MG PO TABS: ORAL_TABLET | ORAL | 5 refills | Status: DC

## 2019-01-23 MED ORDER — LISINOPRIL-HYDROCHLOROTHIAZIDE 10-12.5 MG PO TABS: 1.0000 | ORAL_TABLET | Freq: Every day | ORAL | 3 refills | Status: DC

## 2019-01-23 NOTE — Patient Instructions (Signed)
  Mr. Carrier , Thank you for taking time to come for your Medicare Wellness Visit. I appreciate your ongoing commitment to your health goals. Please review the following plan we discussed and let me know if I can assist you in the future.   These are the goals we discussed: Increase physical activity and decrease alcohol consumption. This is a list of the screening recommended for you and due dates:  Health Maintenance  Topic Date Due  . Tetanus Vaccine  08/02/2017  . Flu Shot  05/05/2019  . Colon Cancer Screening  11/23/2026  .  Hepatitis C: One time screening is recommended by Center for Disease Control  (CDC) for  adults born from [birth year] through 1963.   Completed  . Pneumonia vaccines  Discontinued

## 2019-01-23 NOTE — Progress Notes (Signed)
Lee Kim is a 73 y.o. male who presents for annual wellness visit and follow-up on chronic medical conditions.  Documentation for virtual telephone encounter.  Documentation for virtual audio and video telecommunications through Zoom encounter: The patient was located at home. The provider was located in the office. The patient did consent to this visit and is aware of possible charges through their insurance for this visit. The other persons participating in this telemedicine service were none. Time spent on call was 5 minutes and in review of previous records >30 minutes total. This virtual service is not related to other E/M service within previous 7 days. He has been some of his medications renewed but otherwise thinks he is doing quite nicely.  Past medical social and surgical history as well as allergies reviewed.  He does have underlying OSA and does need a new CPAP machine.  His present one is about 71 years old.  His allergies seem to be under good control.  He is followed regularly for his aortic aneurysm and also has evidence of atherosclerosis.  Review of record also indicates erythrocytosis.  He also has a history of glucose intolerance but has not had an A1c in quite some time.  He continues on Eliquis for his atrial fibrillation.  He is smoking 1 cigarette/day and does drink at least 3 glasses of wine per night.  He would like a refill on his Cialis.   Immunizations and Health Maintenance Immunization History  Administered Date(s) Administered  . H1N1 09/17/2008  . Influenza Split 07/12/2013, 07/22/2015  . Influenza Whole 07/07/2009, 07/11/2010, 07/08/2011  . Influenza, High Dose Seasonal PF 06/20/2014, 07/01/2016, 07/04/2017  . Pneumococcal Conjugate-13 03/05/2015  . Pneumococcal Polysaccharide-23 09/20/2006, 04/08/2008  . Tdap 08/03/2007  . Zoster 09/10/2009   Health Maintenance Due  Topic Date Due  . TETANUS/TDAP  08/02/2017    Last colonoscopy:11-23-2016 Last  PSA: N/A Dentist:11/2018 Ophtho: jan or feb 2020 Exercise: not much  Other doctors caring for patient include:Dr. Crenshaw cardio, Dr. Katy Fitch  And Dr. Garen Lah. Dr. Wynetta Emery GI  Advanced Directives:  Yes.  Copy asked for.  Depression screen:  See questionnaire below.     Depression screen [facility] 2/9 01/23/2019 08/11/2016 08/11/2016 03/05/2015 03/29/2013  Decreased Interest 1 1 0 0 0  Down, Depressed, Hopeless 0 1 0 1 0  PHQ - 2 Score 1 2 0 1 0    Fall Screen: See Questionaire below.   Fall Risk  01/23/2019 09/29/2017 08/11/2016 03/05/2015 03/29/2013  Falls in the past year? 0 No No No No    ADL screen:  See questionnaire below.  Functional Status Survey:   negative  Review of Systems  Constitutional: -, -unexpected weight change, -anorexia, -fatigue Allergy: -sneezing, -itching, -congestion Dermatology: denies changing moles, rash, lumps ENT: -runny nose, -ear pain, -sore throat,  Cardiology:  -chest pain, -palpitations, -orthopnea, Respiratory: -cough, -shortness of breath, -dyspnea on exertion, -wheezing,  Gastroenterology: -abdominal pain, -nausea, -vomiting, -diarrhea, -constipation, -dysphagia Hematology: -bleeding or bruising problems Musculoskeletal: -arthralgias, -myalgias, -joint swelling, -back pain, - Ophthalmology: -vision changes,  Urology: -dysuria, -difficulty urinating,  -urinary frequency, -urgency, incontinence Neurology: -, -numbness, , -memory loss, -falls, -dizziness    PHYSICAL EXAM:  BP 110/74   Wt 287 lb (130.2 kg)   BMI 37.87 kg/m   General Appearance: Alert, cooperative, no distress, appears stated age Head: Normocephalic, without obvious abnormality, atraumatic Psych: Normal mood, affect, hygiene and grooming  ASSESSMENT/PLAN: Chronic atrial fibrillation  Psoriasis - Plan: triamcinolone cream (KENALOG) 0.1 %  Chronic allergic rhinitis  OSA on CPAP  Class 3 severe obesity due to excess calories without serious comorbidity with body mass  index (BMI) of 40.0 to 44.9 in adult [facility])  Permanent atrial fibrillation  Erectile dysfunction, unspecified erectile dysfunction type - Plan: tadalafil (CIALIS) 20 MG tablet  Essential hypertension - Plan: lisinopril-hydrochlorothiazide (ZESTORETIC) 10-12.5 MG tablet, amLODipine (NORVASC) 10 MG tablet, CBC with Differential/Platelet  Glucose intolerance (impaired glucose tolerance) - Plan: Comprehensive metabolic panel  Alcohol use disorder, mild, abuse  ERYTHROCYTOSIS - Plan: CBC with Differential/Platelet  Screening for lipid disorders - Plan: Lipid panel  Multinodular goiter  Atherosclerosis of aorta (HCC)  Thoracic aortic aneurysm without rupture (HCC)   recommended at least 30 minutes of aerobic activity at least 5 days/week; ; healthy diet and alcohol recommendations (less than or equal to 2 drinks/day) reviewed;  Immunization recommendations discussed.  Colonoscopy recommendations reviewed. He will continue to follow-up with his other specialists.  Blood work was ordered for 1 month down the road at his request.  Medicare Attestation I have personally reviewed: The patient's medical and social history Their use of alcohol, tobacco or illicit drugs Their current medications and supplements The patient's functional ability including ADLs,fall risks, home safety risks, cognitive, and hearing and visual impairment Diet and physical activities Evidence for depression or mood disorders  The patient's weight, height, and BMI have been recorded in the chart.  I have made referrals, counseling, and provided education to the patient based on review of the above and I have provided the patient with a written personalized care plan for preventive services.     Jill Alexanders, MD   01/23/2019

## 2019-02-22 ENCOUNTER — Other Ambulatory Visit: Payer: Self-pay

## 2019-03-22 DIAGNOSIS — H16211 Exposure keratoconjunctivitis, right eye: Secondary | ICD-10-CM | POA: Diagnosis not present

## 2019-03-22 DIAGNOSIS — H401131 Primary open-angle glaucoma, bilateral, mild stage: Secondary | ICD-10-CM | POA: Diagnosis not present

## 2019-03-22 DIAGNOSIS — Z961 Presence of intraocular lens: Secondary | ICD-10-CM | POA: Diagnosis not present

## 2019-03-22 DIAGNOSIS — H2512 Age-related nuclear cataract, left eye: Secondary | ICD-10-CM | POA: Diagnosis not present

## 2019-03-22 DIAGNOSIS — H353131 Nonexudative age-related macular degeneration, bilateral, early dry stage: Secondary | ICD-10-CM | POA: Diagnosis not present

## 2019-03-27 ENCOUNTER — Other Ambulatory Visit: Payer: PPO

## 2019-03-27 ENCOUNTER — Other Ambulatory Visit: Payer: Self-pay

## 2019-03-27 DIAGNOSIS — R7302 Impaired glucose tolerance (oral): Secondary | ICD-10-CM

## 2019-03-27 DIAGNOSIS — I10 Essential (primary) hypertension: Secondary | ICD-10-CM

## 2019-03-27 DIAGNOSIS — D751 Secondary polycythemia: Secondary | ICD-10-CM

## 2019-03-27 DIAGNOSIS — Z13220 Encounter for screening for lipoid disorders: Secondary | ICD-10-CM

## 2019-03-27 LAB — CBC WITH DIFFERENTIAL/PLATELET
Basophils Absolute: 0.1 10*3/uL (ref 0.0–0.2)
Basos: 1 %
EOS (ABSOLUTE): 0.3 10*3/uL (ref 0.0–0.4)
Eos: 3 %
Hematocrit: 55 % — ABNORMAL HIGH (ref 37.5–51.0)
Hemoglobin: 18.8 g/dL — ABNORMAL HIGH (ref 13.0–17.7)
Immature Grans (Abs): 0.1 10*3/uL (ref 0.0–0.1)
Immature Granulocytes: 1 %
Lymphocytes Absolute: 1.4 10*3/uL (ref 0.7–3.1)
Lymphs: 18 %
MCH: 32.7 pg (ref 26.6–33.0)
MCHC: 34.2 g/dL (ref 31.5–35.7)
MCV: 96 fL (ref 79–97)
Monocytes Absolute: 1 10*3/uL — ABNORMAL HIGH (ref 0.1–0.9)
Monocytes: 12 %
Neutrophils Absolute: 5.2 10*3/uL (ref 1.4–7.0)
Neutrophils: 65 %
Platelets: 153 10*3/uL (ref 150–450)
RBC: 5.75 x10E6/uL (ref 4.14–5.80)
RDW: 12.9 % (ref 11.6–15.4)
WBC: 7.9 10*3/uL (ref 3.4–10.8)

## 2019-03-27 LAB — LIPID PANEL
Chol/HDL Ratio: 3.2 ratio (ref 0.0–5.0)
Cholesterol, Total: 159 mg/dL (ref 100–199)
HDL: 49 mg/dL (ref >39)
LDL Calculated: 94 mg/dL (ref 0–99)
Triglycerides: 78 mg/dL (ref 0–149)
VLDL Cholesterol Cal: 16 mg/dL (ref 5–40)

## 2019-03-27 LAB — COMPREHENSIVE METABOLIC PANEL
ALT: 24 IU/L (ref 0–44)
AST: 22 IU/L (ref 0–40)
Albumin/Globulin Ratio: 1.6 (ref 1.2–2.2)
Albumin: 4 g/dL (ref 3.7–4.7)
Alkaline Phosphatase: 80 IU/L (ref 39–117)
BUN/Creatinine Ratio: 14 (ref 10–24)
BUN: 20 mg/dL (ref 8–27)
Bilirubin Total: 0.7 mg/dL (ref 0.0–1.2)
CO2: 24 mmol/L (ref 20–29)
Calcium: 9.3 mg/dL (ref 8.6–10.2)
Chloride: 100 mmol/L (ref 96–106)
Creatinine, Ser: 1.43 mg/dL — ABNORMAL HIGH (ref 0.76–1.27)
GFR calc Af Amer: 57 mL/min/{1.73_m2} — ABNORMAL LOW (ref >59)
GFR calc non Af Amer: 49 mL/min/{1.73_m2} — ABNORMAL LOW (ref >59)
Globulin, Total: 2.5 g/dL (ref 1.5–4.5)
Glucose: 133 mg/dL — ABNORMAL HIGH (ref 65–99)
Potassium: 4 mmol/L (ref 3.5–5.2)
Sodium: 142 mmol/L (ref 134–144)
Total Protein: 6.5 g/dL (ref 6.0–8.5)

## 2019-04-17 ENCOUNTER — Other Ambulatory Visit: Payer: Self-pay | Admitting: Family Medicine

## 2019-04-17 DIAGNOSIS — I10 Essential (primary) hypertension: Secondary | ICD-10-CM

## 2019-06-30 ENCOUNTER — Other Ambulatory Visit: Payer: Self-pay | Admitting: Cardiovascular Disease

## 2019-06-30 DIAGNOSIS — I482 Chronic atrial fibrillation, unspecified: Secondary | ICD-10-CM

## 2019-10-17 ENCOUNTER — Other Ambulatory Visit: Payer: Self-pay | Admitting: Cardiovascular Disease

## 2019-10-17 DIAGNOSIS — I482 Chronic atrial fibrillation, unspecified: Secondary | ICD-10-CM

## 2019-10-25 DIAGNOSIS — H348312 Tributary (branch) retinal vein occlusion, right eye, stable: Secondary | ICD-10-CM | POA: Diagnosis not present

## 2019-10-25 DIAGNOSIS — H353131 Nonexudative age-related macular degeneration, bilateral, early dry stage: Secondary | ICD-10-CM | POA: Diagnosis not present

## 2019-10-25 DIAGNOSIS — Z9889 Other specified postprocedural states: Secondary | ICD-10-CM | POA: Diagnosis not present

## 2019-10-25 DIAGNOSIS — H16211 Exposure keratoconjunctivitis, right eye: Secondary | ICD-10-CM | POA: Diagnosis not present

## 2019-10-25 DIAGNOSIS — H2512 Age-related nuclear cataract, left eye: Secondary | ICD-10-CM | POA: Diagnosis not present

## 2019-10-25 DIAGNOSIS — Z961 Presence of intraocular lens: Secondary | ICD-10-CM | POA: Diagnosis not present

## 2019-10-25 DIAGNOSIS — H401131 Primary open-angle glaucoma, bilateral, mild stage: Secondary | ICD-10-CM | POA: Diagnosis not present

## 2019-11-06 ENCOUNTER — Other Ambulatory Visit: Payer: Self-pay | Admitting: *Deleted

## 2019-11-06 ENCOUNTER — Telehealth: Payer: Self-pay | Admitting: *Deleted

## 2019-11-06 DIAGNOSIS — I712 Thoracic aortic aneurysm, without rupture, unspecified: Secondary | ICD-10-CM

## 2019-11-06 DIAGNOSIS — E041 Nontoxic single thyroid nodule: Secondary | ICD-10-CM

## 2019-11-06 NOTE — Telephone Encounter (Addendum)
Spoke with pt, he is due for CTA of the chest to follow up thoracic aneurysm and also thyroid US to follow up on nodule. Order placed for [place] [facility] [address]. Follow up scheduled per recall, patient preferred virtual visit.

## 2019-11-08 ENCOUNTER — Telehealth: Payer: Self-pay | Admitting: Cardiology

## 2019-11-08 NOTE — Telephone Encounter (Signed)
Spoke with patient regarding appointment for CTA chest aorta scheduled Tuesday 12/11/19 at 9:30 am at [facility] [address]----arrival time is 9:00 am liquids only 4 hours prior. Patient to come in 1 week prior for BMET.  Patient voiced his understanding.

## 2019-11-19 DIAGNOSIS — I712 Thoracic aortic aneurysm, without rupture: Secondary | ICD-10-CM | POA: Diagnosis not present

## 2019-11-19 LAB — BASIC METABOLIC PANEL
BUN/Creatinine Ratio: 17 (ref 10–24)
BUN: 26 mg/dL (ref 8–27)
CO2: 21 mmol/L (ref 20–29)
Calcium: 9 mg/dL (ref 8.6–10.2)
Chloride: 103 mmol/L (ref 96–106)
Creatinine, Ser: 1.5 mg/dL — ABNORMAL HIGH (ref 0.76–1.27)
GFR calc Af Amer: 53 mL/min/{1.73_m2} — ABNORMAL LOW (ref >59)
GFR calc non Af Amer: 46 mL/min/{1.73_m2} — ABNORMAL LOW (ref >59)
Glucose: 126 mg/dL — ABNORMAL HIGH (ref 65–99)
Potassium: 4.1 mmol/L (ref 3.5–5.2)
Sodium: 139 mmol/L (ref 134–144)

## 2019-11-20 ENCOUNTER — Encounter: Payer: Self-pay | Admitting: *Deleted

## 2019-11-28 ENCOUNTER — Ambulatory Visit
Admission: RE | Admit: 2019-11-28 | Discharge: 2019-11-28 | Disposition: A | Discharge status: Home / Self Care | Payer: PPO | Source: Ambulatory Visit | Attending: Cardiology | Admitting: Cardiology

## 2019-11-28 DIAGNOSIS — E041 Nontoxic single thyroid nodule: Secondary | ICD-10-CM | POA: Diagnosis not present

## 2019-12-06 NOTE — Progress Notes (Signed)
Virtual Visit via Video Note changed to phone visit at pt request   This visit type was conducted due to national recommendations for restrictions regarding the COVID-19 Pandemic (e.g. social distancing) in an effort to limit this patient's exposure and mitigate transmission in our community.  Due to his co-morbid illnesses, this patient is at least at moderate risk for complications without adequate follow up.  This format is felt to be most appropriate for this patient at this time.  All issues noted in this document were discussed and addressed.  A limited physical exam was performed with this format.  Please refer to the patient's chart for his consent to telehealth for [facility].   Date:  12/14/2019   ID:  Lee Kim, DOB 1945-11-01, MRN [id number]  Patient Location:Home Provider Location: Home  PCP:  [name], MD  Cardiologist:  Dr Stanford Breed  Evaluation Performed:  Follow-Up Visit  Chief Complaint:  FU atrial fibrillation  History of Present Illness:    FUatrial fibrillation. 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 Ejection fraction 42%.  Dr. Wynonia Lawman felt this was likely diaphragmatic attenuation and echocardiogram at the time showed normal LV function.  Patientpreviously seen by primary care for routine physical and noted to be in atrial fibrillation. Echo 11/17 showed EF 45-50, moderate to severe LVH, mildly dilated aortic root, mild biatrial enlargement. Holter 12/17 showed afib with 20 beats WCT felt to be aberrancy. Abd ultrasound 12/17 showed no aneurysm. Thyroid ultrasound February 2021 showed no worrisome new or enlarging thyroid nodules.  Previous nodules that were biopsied unchanged.  Note previous biopsies were benign.  CTA repeated March 2021 and showed stable 4.2 cm thoracic aortic aneurysm, severe atherosclerosis in the descending thoracic aorta, calcium in all 3 coronaries, multinodular goiter, interstitial lung  disease and left hepatic lobe enlargement.  Since last seen,patient denies dyspnea, exertional chest pain, palpitations, syncope or bleeding.  The patient does not have symptoms concerning for COVID-19 infection (fever, chills, cough, or new shortness of breath).    Past Medical History:  Diagnosis Date  . Colonic polyp   . Diverticulitis   . Erythrocytosis   . Hypertension   . Obesity   . OSA (obstructive sleep apnea)    Past Surgical History:  Procedure Laterality Date  . COLONOSCOPY  03/2003   Dr. Wynetta Emery  . COLONOSCOPY WITH PROPOFOL N/A 11/23/2016   Procedure: COLONOSCOPY WITH PROPOFOL;  Surgeon: Garlan Fair, MD;  Location: [facility];  Service: Endoscopy;  Laterality: N/A;  . EYE SURGERY       Current Meds  Medication Sig  . amLODipine (NORVASC) 10 MG tablet TAKE 1 TABLET(10 MG) BY MOUTH DAILY  . cromolyn (NASALCROM) 5.2 MG/ACT nasal spray Place 1 spray into both nostrils 2 (two) times daily.   Marland Kitchen ELIQUIS 5 MG TABS tablet TAKE 1 TABLET(5 MG) BY MOUTH TWICE DAILY  . lisinopril-hydrochlorothiazide (ZESTORETIC) 10-12.5 MG tablet TAKE 1 TABLET BY MOUTH DAILY  . Multiple Vitamins-Minerals (PRESERVISION AREDS 2 PO) Take 1 tablet by mouth 2 (two) times daily.   . tadalafil (CIALIS) 20 MG tablet TAKE 1 TABLET BY MOUTH EVERY DAY AS NEEDED  . TRAVATAN Z 0.004 % SOLN ophthalmic solution Place 1 drop into the right eye at bedtime.   . triamcinolone cream (KENALOG) 0.1 % Apply 1 application topically 2 (two) times daily.     Allergies:   Benzalkonium chloride   Social History   Tobacco Use  . Smoking  status: Current Every Day Smoker    Types: Cigarettes  . Smokeless tobacco: Never Used  Substance Use Topics  . Alcohol use: Yes    Alcohol/week: 7.0 standard drinks    Types: 7 Glasses of wine per week    Comment: 3-4 glasses wine per night  . Drug use: No     Family Hx: The patient's family history includes Hypertension in his father, mother, and sister.  ROS:    Please see the history of present illness.    No Fever, chills  or productive cough All other systems reviewed and are negative.   Recent Labs: 03/27/2019: ALT 24; Hemoglobin 18.8; Platelets 153 11/19/2019: BUN 26; Creatinine, Ser 1.50; Potassium 4.1; Sodium 139   Recent Lipid Panel Lab Results  Component Value Date/Time   CHOL 159 03/27/2019 08:14 AM   TRIG 78 03/27/2019 08:14 AM   HDL 49 03/27/2019 08:14 AM   CHOLHDL 3.2 03/27/2019 08:14 AM   CHOLHDL 3.2 09/29/2017 11:19 AM   LDLCALC 94 03/27/2019 08:14 AM   LDLCALC 85 09/29/2017 11:19 AM    Wt Readings from Last 3 Encounters:  12/14/19 265 lb (120.2 kg)  01/23/19 287 lb (130.2 kg)  12/08/18 281 lb 9.6 oz (127.7 kg)     Objective:    Vital Signs:  Ht 6\' 1"  (1.854 m)   Wt 265 lb (120.2 kg)   BMI 34.96 kg/m    VITAL SIGNS:  reviewed NAD Answers questions appropriately Normal affect Remainder of physical examination not performed (telehealth visit; coronavirus pandemic)  ASSESSMENT & PLAN:    1. Permanent atrial fibrillation-continue with rate control and anticoagulation.  His rate is controlled on no medications.  Continue apixaban. 2. Hypertension-Continue present medical regimen. 3. Thoracic aortic aneurysm-patient will need follow-up CT March 2022. 4. Thyroid nodule-plan follow-up thyroid ultrasound February 2022. 5. Tobacco abuse-patient counseled on discontinuing. 6. History of alcohol abuse 7. Coronary calcification-significant atherosclerosis and coronary calcification noted on CT scan.  We will add Crestor 40 mg daily.  Check lipids and liver in 12 weeks.  COVID-19 Education: The importance of social distancing was discussed today.  Time:   Today, I have spent 17 minutes with the patient with telehealth technology discussing the above problems.     Medication Adjustments/Labs and Tests Ordered: Current medicines are reviewed at length with the patient today.  Concerns regarding medicines are outlined  above.   Tests Ordered: No orders of the defined types were placed in this encounter.   Medication Changes: No orders of the defined types were placed in this encounter.   Follow Up:  Either In Person or Virtual in 1 year(s)  Signed, Kirk Ruths, MD  12/14/2019 8:11 AM    [place]

## 2019-12-11 ENCOUNTER — Ambulatory Visit
Admission: RE | Admit: 2019-12-11 | Discharge: 2019-12-11 | Disposition: A | Discharge status: Home / Self Care | Payer: PPO | Source: Ambulatory Visit | Attending: Cardiology | Admitting: Cardiology

## 2019-12-11 DIAGNOSIS — I712 Thoracic aortic aneurysm, without rupture, unspecified: Secondary | ICD-10-CM

## 2019-12-11 MED ORDER — IOPAMIDOL (ISOVUE-370) INJECTION 76%
75.0000 mL | Freq: Once | INTRAVENOUS | Status: AC | PRN
  Administered 2019-12-11: 75 mL via INTRAVENOUS

## 2019-12-14 ENCOUNTER — Encounter: Payer: Self-pay | Admitting: Cardiology

## 2019-12-14 ENCOUNTER — Telehealth (INDEPENDENT_AMBULATORY_CARE_PROVIDER_SITE_OTHER): Payer: PPO | Admitting: Cardiology

## 2019-12-14 VITALS — Ht 73.0 in | Wt 265.0 lb

## 2019-12-14 DIAGNOSIS — E785 Hyperlipidemia, unspecified: Secondary | ICD-10-CM | POA: Diagnosis not present

## 2019-12-14 DIAGNOSIS — E041 Nontoxic single thyroid nodule: Secondary | ICD-10-CM

## 2019-12-14 DIAGNOSIS — I4821 Permanent atrial fibrillation: Secondary | ICD-10-CM | POA: Diagnosis not present

## 2019-12-14 DIAGNOSIS — I712 Thoracic aortic aneurysm, without rupture, unspecified: Secondary | ICD-10-CM

## 2019-12-14 DIAGNOSIS — I10 Essential (primary) hypertension: Secondary | ICD-10-CM | POA: Diagnosis not present

## 2019-12-14 MED ORDER — ROSUVASTATIN CALCIUM 40 MG PO TABS: 40.0000 mg | ORAL_TABLET | Freq: Every day | ORAL | 3 refills | Status: DC

## 2019-12-14 NOTE — Patient Instructions (Signed)
Medication Instructions:  START ROSUVASTATIN 40 MG ONCE DAILY  *If you need a refill on your cardiac medications before your next appointment, please call your pharmacy*   Lab Work: Your physician recommends that you return for lab work in: [place]  If you have labs (blood work) drawn today and your tests are completely normal, you will receive your results only by: Marland Kitchen MyChart Message (if you have MyChart) OR . A paper copy in the mail If you have any lab test that is abnormal or we need to change your treatment, we will call you to review the results  Follow-Up: At [facility], you and your health needs are our priority.  As part of our continuing mission to provide you with exceptional heart care, we have created designated Provider Care Teams.  These Care Teams include your primary Cardiologist (physician) and Advanced Practice Providers (APPs -  Physician Assistants and Nurse Practitioners) who all work together to provide you with the care you need, when you need it.  We recommend signing up for the patient portal called "MyChart".  Sign up information is provided on this After Visit Summary.  MyChart is used to connect with patients for Virtual Visits (Telemedicine).  Patients are able to view lab/test results, encounter notes, upcoming appointments, etc.  Non-urgent messages can be sent to your provider as well.   To learn more about what you can do with MyChart, go to [email or web address].    Your next appointment:   6 month(s)  The format for your next appointment:   In Person  Provider:   You may see Kirk Ruths MD or one of the following Advanced Practice Providers on your designated Care Team:    Kerin Ransom, PA-C  [place], [place]  Coletta Memos, [place]

## 2020-01-17 ENCOUNTER — Other Ambulatory Visit: Payer: Self-pay

## 2020-01-17 DIAGNOSIS — I482 Chronic atrial fibrillation, unspecified: Secondary | ICD-10-CM

## 2020-01-17 MED ORDER — APIXABAN 5 MG PO TABS: ORAL_TABLET | ORAL | 1 refills | Status: DC

## 2020-03-27 ENCOUNTER — Encounter: Payer: Self-pay | Admitting: *Deleted

## 2020-04-09 ENCOUNTER — Other Ambulatory Visit: Payer: Self-pay | Admitting: Family Medicine

## 2020-04-09 ENCOUNTER — Encounter: Payer: Self-pay | Admitting: *Deleted

## 2020-04-09 DIAGNOSIS — I10 Essential (primary) hypertension: Secondary | ICD-10-CM

## 2020-04-09 DIAGNOSIS — E785 Hyperlipidemia, unspecified: Secondary | ICD-10-CM | POA: Diagnosis not present

## 2020-04-09 LAB — LIPID PANEL
Chol/HDL Ratio: 2 ratio (ref 0.0–5.0)
Cholesterol, Total: 98 mg/dL — ABNORMAL LOW (ref 100–199)
HDL: 49 mg/dL (ref >39)
LDL Chol Calc (NIH): 36 mg/dL (ref 0–99)
Triglycerides: 53 mg/dL (ref 0–149)
VLDL Cholesterol Cal: 13 mg/dL (ref 5–40)

## 2020-04-09 LAB — HEPATIC FUNCTION PANEL
ALT: 23 IU/L (ref 0–44)
AST: 19 IU/L (ref 0–40)
Albumin: 4 g/dL (ref 3.7–4.7)
Alkaline Phosphatase: 97 IU/L (ref 48–121)
Bilirubin Total: 0.5 mg/dL (ref 0.0–1.2)
Bilirubin, Direct: 0.21 mg/dL (ref 0.00–0.40)
Total Protein: 6.7 g/dL (ref 6.0–8.5)

## 2020-04-10 ENCOUNTER — Other Ambulatory Visit: Payer: Self-pay | Admitting: Family Medicine

## 2020-04-10 DIAGNOSIS — I10 Essential (primary) hypertension: Secondary | ICD-10-CM

## 2020-04-11 ENCOUNTER — Telehealth: Payer: Self-pay

## 2020-04-11 NOTE — Telephone Encounter (Signed)
LVM advising pt it is time to schedule an appt with Dr. Redmond School. [place]

## 2020-04-14 ENCOUNTER — Other Ambulatory Visit: Payer: Self-pay | Admitting: Family Medicine

## 2020-04-14 DIAGNOSIS — I10 Essential (primary) hypertension: Secondary | ICD-10-CM

## 2020-04-28 DIAGNOSIS — H2512 Age-related nuclear cataract, left eye: Secondary | ICD-10-CM | POA: Diagnosis not present

## 2020-04-28 DIAGNOSIS — H16211 Exposure keratoconjunctivitis, right eye: Secondary | ICD-10-CM | POA: Diagnosis not present

## 2020-04-28 DIAGNOSIS — H401131 Primary open-angle glaucoma, bilateral, mild stage: Secondary | ICD-10-CM | POA: Diagnosis not present

## 2020-04-28 DIAGNOSIS — Z961 Presence of intraocular lens: Secondary | ICD-10-CM | POA: Diagnosis not present

## 2020-05-12 ENCOUNTER — Telehealth: Payer: Self-pay

## 2020-05-12 ENCOUNTER — Other Ambulatory Visit: Payer: Self-pay | Admitting: Family Medicine

## 2020-05-12 DIAGNOSIS — N529 Male erectile dysfunction, unspecified: Secondary | ICD-10-CM

## 2020-05-12 DIAGNOSIS — I10 Essential (primary) hypertension: Secondary | ICD-10-CM

## 2020-05-12 MED ORDER — TADALAFIL 20 MG PO TABS: ORAL_TABLET | ORAL | 5 refills | Status: DC

## 2020-05-12 NOTE — Telephone Encounter (Signed)
Pt is requesting to have his cialis filled please advise [facility]

## 2020-05-22 ENCOUNTER — Telehealth: Payer: Self-pay | Admitting: *Deleted

## 2020-05-22 NOTE — Telephone Encounter (Signed)
A message was left, re: his follow up visit. 

## 2020-06-06 NOTE — Progress Notes (Signed)
HPI: FUatrial fibrillation. 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 Ejection fraction 42%.  Dr. Wynonia Lawman felt this was likely diaphragmatic attenuation and echocardiogram at the time showed normal LV function. Patientpreviously seen by primary care for routine physical and noted to be in atrial fibrillation. Echo 11/17 showed EF 45-50, moderate to severe LVH, mildly dilated aortic root, mild biatrial enlargement. Holter 12/17 showed afib with 20 beats WCT felt to be aberrancy. Abd ultrasound 12/17 showed no aneurysm. Thyroid ultrasound February 2021 showed no worrisome new or enlarging thyroid nodules. Previous nodules that were biopsied unchanged. Note previous biopsies were benign.  CTA repeated March 2021 and showed stable 4.2 cm thoracic aortic aneurysm, severe atherosclerosis in the descending thoracic aorta, calcium in all 3 coronaries, multinodular goiter, interstitial lung disease and left hepatic lobe enlargement.  Since last seen, there is no dyspnea, chest pain, palpitations or syncope.  Current Outpatient Medications  Medication Sig Dispense Refill  . amLODipine (NORVASC) 10 MG tablet Take 1 tablet (10 mg total) by mouth daily. 90 tablet 3  . apixaban (ELIQUIS) 5 MG TABS tablet TAKE 1 TABLET(5 MG) BY MOUTH TWICE DAILY 180 tablet 1  . cromolyn (NASALCROM) 5.2 MG/ACT nasal spray Place 1 spray into both nostrils 2 (two) times daily.     Marland Kitchen lisinopril-hydrochlorothiazide (ZESTORETIC) 10-12.5 MG tablet TAKE 1 TABLET BY MOUTH DAILY 90 tablet 3  . Multiple Vitamin (MULTIVITAMIN) tablet Take 1 tablet by mouth daily.    . Multiple Vitamins-Minerals (PRESERVISION AREDS 2 PO) Take 1 tablet by mouth 2 (two) times daily.     . rosuvastatin (CRESTOR) 40 MG tablet Take 1 tablet (40 mg total) by mouth daily. 90 tablet 3  . tadalafil (CIALIS) 20 MG tablet TAKE 1 TABLET BY MOUTH EVERY DAY AS NEEDED 30 tablet 5  . TRAVATAN Z 0.004 % SOLN ophthalmic solution  Place 1 drop into the right eye at bedtime.   12  . triamcinolone cream (KENALOG) 0.1 % Apply 1 application topically 2 (two) times daily. 45 g 5   No current facility-administered medications for this visit.     Past Medical History:  Diagnosis Date  . Colonic polyp   . Diverticulitis   . Erythrocytosis   . Hypertension   . Obesity   . OSA (obstructive sleep apnea)     Past Surgical History:  Procedure Laterality Date  . COLONOSCOPY  03/2003   Dr. Wynetta Emery  . COLONOSCOPY WITH PROPOFOL N/A 11/23/2016   Procedure: COLONOSCOPY WITH PROPOFOL;  Surgeon: Garlan Fair, MD;  Location: [facility];  Service: Endoscopy;  Laterality: N/A;  . EYE SURGERY      Social History   Socioeconomic History  . Marital status: Married    Spouse name: Not on file  . Number of children: 1  . Years of education: Not on file  . Highest education level: Not on file  Occupational History  . Not on file  Tobacco Use  . Smoking status: Current Every Day Smoker    Types: Cigarettes  . Smokeless tobacco: Never Used  Vaping Use  . Vaping Use: Never used  Substance and Sexual Activity  . Alcohol use: Yes    Alcohol/week: 7.0 standard drinks    Types: 7 Glasses of wine per week    Comment: 3-4 glasses wine per night  . Drug use: No  . Sexual activity: Yes  Other Topics Concern  . Not on file  Social History Narrative  .  Not on file   Social Determinants of Health   Financial Resource Strain:   . Difficulty of Paying Living Expenses: Not on file  Food Insecurity:   . Worried About Charity fundraiser in the Last Year: Not on file  . Ran Out of Food in the Last Year: Not on file  Transportation Needs:   . Lack of Transportation (Medical): Not on file  . Lack of Transportation (Non-Medical): Not on file  Physical Activity:   . Days of Exercise per Week: Not on file  . Minutes of Exercise per Session: Not on file  Stress:   . Feeling of Stress : Not on file  Social Connections:   .  Frequency of Communication with Friends and Family: Not on file  . Frequency of Social Gatherings with Friends and Family: Not on file  . Attends Religious Services: Not on file  . Active Member of Clubs or Organizations: Not on file  . Attends Archivist Meetings: Not on file  . Marital Status: Not on file  Intimate Partner Violence:   . Fear of Current or Ex-Partner: Not on file  . Emotionally Abused: Not on file  . Physically Abused: Not on file  . Sexually Abused: Not on file    Family History  Problem Relation Age of Onset  . Hypertension Mother   . Hypertension Father   . Hypertension Sister     ROS: no fevers or chills, productive cough, hemoptysis, dysphasia, odynophagia, melena, hematochezia, dysuria, hematuria, rash, seizure activity, orthopnea, PND, pedal edema, claudication. Remaining systems are negative.  Physical Exam: Well-developed well-nourished in no acute distress.  Skin is warm and dry.  HEENT is normal.  Neck is supple.  Chest is clear to auscultation with normal expansion.  Cardiovascular exam is irregular Abdominal exam nontender or distended. No masses palpated. Extremities show trace to 1+ prior worse with albuterol drug like that I thought they can check some eyes from now to see if they were actually looking at it is related edema. neuro grossly intact   A/P  1 permanent atrial fibrillation-plan to continue rate control and anticoagulation.  Continue apixaban at present dose.  Heart rate is controlled on no AV nodal blocking agents.  2 hypertension-blood pressure controlled.  Continue present medications.  3 thoracic aortic aneurysm-follow-up CT March 2022.  4 history of thyroid nodule-patient needs follow-up thyroid ultrasound February 2022.  5 coronary artery disease-based on CT demonstrating coronary calcification-continue Crestor.  No aspirin given need for apixaban.  Patient denies chest pain.  6 tobacco abuse-patient counseled  on discontinuing.  7 history of alcohol abuse  Kirk Ruths, MD

## 2020-06-11 ENCOUNTER — Other Ambulatory Visit: Payer: Self-pay

## 2020-06-11 ENCOUNTER — Other Ambulatory Visit: Payer: Self-pay | Admitting: Family Medicine

## 2020-06-11 ENCOUNTER — Ambulatory Visit (INDEPENDENT_AMBULATORY_CARE_PROVIDER_SITE_OTHER): Payer: PPO | Admitting: Family Medicine

## 2020-06-11 VITALS — BP 140/88 | HR 61 | Temp 97.2°F | Wt 276.4 lb

## 2020-06-11 DIAGNOSIS — Z23 Encounter for immunization: Secondary | ICD-10-CM | POA: Diagnosis not present

## 2020-06-11 DIAGNOSIS — I700 Atherosclerosis of aorta: Secondary | ICD-10-CM

## 2020-06-11 DIAGNOSIS — N529 Male erectile dysfunction, unspecified: Secondary | ICD-10-CM

## 2020-06-11 DIAGNOSIS — J309 Allergic rhinitis, unspecified: Secondary | ICD-10-CM

## 2020-06-11 DIAGNOSIS — F1010 Alcohol abuse, uncomplicated: Secondary | ICD-10-CM | POA: Diagnosis not present

## 2020-06-11 DIAGNOSIS — Z9989 Dependence on other enabling machines and devices: Secondary | ICD-10-CM

## 2020-06-11 DIAGNOSIS — E1122 Type 2 diabetes mellitus with diabetic chronic kidney disease: Secondary | ICD-10-CM

## 2020-06-11 DIAGNOSIS — I482 Chronic atrial fibrillation, unspecified: Secondary | ICD-10-CM | POA: Diagnosis not present

## 2020-06-11 DIAGNOSIS — I10 Essential (primary) hypertension: Secondary | ICD-10-CM | POA: Diagnosis not present

## 2020-06-11 DIAGNOSIS — E042 Nontoxic multinodular goiter: Secondary | ICD-10-CM

## 2020-06-11 DIAGNOSIS — Z86010 Personal history of colonic polyps: Secondary | ICD-10-CM

## 2020-06-11 DIAGNOSIS — E6609 Other obesity due to excess calories: Secondary | ICD-10-CM | POA: Diagnosis not present

## 2020-06-11 DIAGNOSIS — Z6841 Body Mass Index (BMI) 40.0 and over, adult: Secondary | ICD-10-CM

## 2020-06-11 DIAGNOSIS — E785 Hyperlipidemia, unspecified: Secondary | ICD-10-CM | POA: Diagnosis not present

## 2020-06-11 DIAGNOSIS — R7302 Impaired glucose tolerance (oral): Secondary | ICD-10-CM | POA: Diagnosis not present

## 2020-06-11 DIAGNOSIS — N183 Chronic kidney disease, stage 3 unspecified: Secondary | ICD-10-CM

## 2020-06-11 DIAGNOSIS — G4733 Obstructive sleep apnea (adult) (pediatric): Secondary | ICD-10-CM | POA: Diagnosis not present

## 2020-06-11 LAB — POCT GLYCOSYLATED HEMOGLOBIN (HGB A1C): Hemoglobin A1C: 6 % — AB (ref 4.0–5.6)

## 2020-06-11 MED ORDER — TADALAFIL 20 MG PO TABS: ORAL_TABLET | ORAL | 5 refills | Status: DC

## 2020-06-11 MED ORDER — LISINOPRIL-HYDROCHLOROTHIAZIDE 10-12.5 MG PO TABS: 1.0000 | ORAL_TABLET | Freq: Every day | ORAL | 0 refills | Status: DC

## 2020-06-11 MED ORDER — MONTELUKAST SODIUM 10 MG PO TABS: 10.0000 mg | ORAL_TABLET | Freq: Every day | ORAL | 0 refills | Status: DC

## 2020-06-11 MED ORDER — ROSUVASTATIN CALCIUM 40 MG PO TABS: 40.0000 mg | ORAL_TABLET | Freq: Every day | ORAL | 3 refills | Status: DC

## 2020-06-11 MED ORDER — AMLODIPINE BESYLATE 10 MG PO TABS: 10.0000 mg | ORAL_TABLET | Freq: Every day | ORAL | 3 refills | Status: DC

## 2020-06-11 MED ORDER — APIXABAN 5 MG PO TABS: ORAL_TABLET | ORAL | 1 refills | Status: DC

## 2020-06-11 NOTE — Progress Notes (Signed)
Subjective:    Patient ID: Lee Kim, male    DOB: 06-30-1946, 74 y.o.   MRN: [id number]  HPI He is here for a med check appointment.  He does have an underlying history of atrial fibrillation and is on Eliquis for this.  He has an appointment set up with cardiology in the near future.  He also has hypertension and is taking lisinopril/HCTZ, amlodipine and having no difficulty with that.  He does have OSA and is on CPAP.  He would like a prescription to get a new CPAP machine.  He drinks 2-3 alcoholic beverages per day.  His allergies are not under adequate control.  He has tried antihistamines but finds them not helpful.  He continues on Nasalcrom.  He does have a history of colonic polyps and is scheduled for routine follow-up.  He does have a history of multinodular goiter however he is not interested in follow-up on that.  Review of his record shows that he has had evaluation for abdominal aortic aneurysm and none was found.  He did injure his right knee approximately 1 month ago.  Apparently it did swell but now the swelling has diminished and he still feels a little unstable on it.  He does have a history of glucose intolerance.  He would also like a refill on his Cialis for his underlying ADD.  Review of record indicates evidence of atherosclerosis.  His medications were reviewed. He also complains of a tingling sensation intermittently in his hands and his feet.  He is also noted some slight leg swelling.  He has noted no weakness, fine motor issues or falling.  Review of Systems     Objective:   Physical Exam Alert and in no distress.  DTRs are normal.  Tympanic membranes and canals are normal. Pharyngeal area is normal. Neck is supple without adenopathy or thyromegaly. Cardiac exam shows a regular sinus rhythm without murmurs or gallops. Lungs are clear to auscultation. Hemoglobin A1c is 6.0       Assessment & Plan:  Class 3 severe obesity due to excess calories without  serious comorbidity with body mass index (BMI) of 40.0 to 44.9 in adult [facility]) - Plan: CBC with Differential/Platelet, Comprehensive metabolic panel, Lipid panel  OSA on CPAP  Essential hypertension - Plan: amLODipine (NORVASC) 10 MG tablet, CBC with Differential/Platelet, Comprehensive metabolic panel, DISCONTINUED: lisinopril-hydrochlorothiazide (ZESTORETIC) 10-12.5 MG tablet  Chronic allergic rhinitis - Plan: montelukast (SINGULAIR) 10 MG tablet  Alcohol use disorder, mild, abuse  Glucose intolerance (impaired glucose tolerance) - Plan: POCT glycosylated hemoglobin (Hb A1C)  Erectile dysfunction, unspecified erectile dysfunction type - Plan: tadalafil (CIALIS) 20 MG tablet  Multinodular goiter  Atherosclerosis of aorta (HCC) - Plan: Lipid panel  Need for influenza vaccination - Plan: Flu Vaccine QUAD High Dose(Fluad)  Chronic atrial fibrillation (HCC) - Plan: apixaban (ELIQUIS) 5 MG TABS tablet  Hyperlipidemia LDL goal <70 - Plan: rosuvastatin (CRESTOR) 40 MG tablet, DISCONTINUED: rosuvastatin (CRESTOR) 40 MG tablet  History of colonic polyps He will follow up with gastroenterology concerning the polyps as previously scheduled.  Prescription written for new CPAP machine.  As per his wishes I did not order an ultrasound.  He will follow up with cardiology.  I discussed the tingling sensation and recommend that he keep a record on when this occurs and any other symptoms associated with it for both his hands and his feet to see if there is a pattern to this.  At this point no intervention is  really needed.  Over 45 minutes spent in counseling and coordination of care as well as an exam.

## 2020-06-11 NOTE — Telephone Encounter (Signed)
Pharmacy is requesting 90 day supply. Is this ok?

## 2020-06-12 DIAGNOSIS — E1122 Type 2 diabetes mellitus with diabetic chronic kidney disease: Secondary | ICD-10-CM | POA: Insufficient documentation

## 2020-06-12 DIAGNOSIS — N183 Chronic kidney disease, stage 3 unspecified: Secondary | ICD-10-CM | POA: Insufficient documentation

## 2020-06-12 LAB — CBC WITH DIFFERENTIAL/PLATELET
Basophils Absolute: 0.1 10*3/uL (ref 0.0–0.2)
Basos: 1 %
EOS (ABSOLUTE): 0.2 10*3/uL (ref 0.0–0.4)
Eos: 2 %
Hematocrit: 49.8 % (ref 37.5–51.0)
Hemoglobin: 17.8 g/dL — ABNORMAL HIGH (ref 13.0–17.7)
Immature Grans (Abs): 0 10*3/uL (ref 0.0–0.1)
Immature Granulocytes: 1 %
Lymphocytes Absolute: 1.4 10*3/uL (ref 0.7–3.1)
Lymphs: 18 %
MCH: 33 pg (ref 26.6–33.0)
MCHC: 35.7 g/dL (ref 31.5–35.7)
MCV: 92 fL (ref 79–97)
Monocytes Absolute: 1.1 10*3/uL — ABNORMAL HIGH (ref 0.1–0.9)
Monocytes: 14 %
Neutrophils Absolute: 5.1 10*3/uL (ref 1.4–7.0)
Neutrophils: 64 %
Platelets: 192 10*3/uL (ref 150–450)
RBC: 5.39 x10E6/uL (ref 4.14–5.80)
RDW: 12.6 % (ref 11.6–15.4)
WBC: 7.8 10*3/uL (ref 3.4–10.8)

## 2020-06-12 LAB — LIPID PANEL
Chol/HDL Ratio: 2.2 ratio (ref 0.0–5.0)
Cholesterol, Total: 118 mg/dL (ref 100–199)
HDL: 53 mg/dL (ref >39)
LDL Chol Calc (NIH): 49 mg/dL (ref 0–99)
Triglycerides: 80 mg/dL (ref 0–149)
VLDL Cholesterol Cal: 16 mg/dL (ref 5–40)

## 2020-06-12 LAB — COMPREHENSIVE METABOLIC PANEL
ALT: 40 IU/L (ref 0–44)
AST: 27 IU/L (ref 0–40)
Albumin/Globulin Ratio: 1.6 (ref 1.2–2.2)
Albumin: 4.2 g/dL (ref 3.7–4.7)
Alkaline Phosphatase: 112 IU/L (ref 48–121)
BUN/Creatinine Ratio: 15 (ref 10–24)
BUN: 22 mg/dL (ref 8–27)
Bilirubin Total: 0.7 mg/dL (ref 0.0–1.2)
CO2: 26 mmol/L (ref 20–29)
Calcium: 9.7 mg/dL (ref 8.6–10.2)
Chloride: 100 mmol/L (ref 96–106)
Creatinine, Ser: 1.42 mg/dL — ABNORMAL HIGH (ref 0.76–1.27)
GFR calc Af Amer: 57 mL/min/{1.73_m2} — ABNORMAL LOW (ref >59)
GFR calc non Af Amer: 49 mL/min/{1.73_m2} — ABNORMAL LOW (ref >59)
Globulin, Total: 2.6 g/dL (ref 1.5–4.5)
Glucose: 139 mg/dL — ABNORMAL HIGH (ref 65–99)
Potassium: 4.5 mmol/L (ref 3.5–5.2)
Sodium: 140 mmol/L (ref 134–144)
Total Protein: 6.8 g/dL (ref 6.0–8.5)

## 2020-06-16 ENCOUNTER — Ambulatory Visit: Payer: PPO | Admitting: Cardiology

## 2020-06-16 ENCOUNTER — Encounter: Payer: Self-pay | Admitting: Cardiology

## 2020-06-16 ENCOUNTER — Other Ambulatory Visit: Payer: Self-pay

## 2020-06-16 ENCOUNTER — Other Ambulatory Visit: Payer: Self-pay | Admitting: *Deleted

## 2020-06-16 VITALS — BP 132/87 | HR 73 | Ht 73.0 in | Wt 269.0 lb

## 2020-06-16 DIAGNOSIS — I10 Essential (primary) hypertension: Secondary | ICD-10-CM

## 2020-06-16 DIAGNOSIS — I712 Thoracic aortic aneurysm, without rupture, unspecified: Secondary | ICD-10-CM

## 2020-06-16 DIAGNOSIS — E041 Nontoxic single thyroid nodule: Secondary | ICD-10-CM

## 2020-06-16 DIAGNOSIS — I4821 Permanent atrial fibrillation: Secondary | ICD-10-CM | POA: Diagnosis not present

## 2020-06-16 DIAGNOSIS — E785 Hyperlipidemia, unspecified: Secondary | ICD-10-CM | POA: Diagnosis not present

## 2020-06-16 DIAGNOSIS — Z01812 Encounter for preprocedural laboratory examination: Secondary | ICD-10-CM

## 2020-06-16 NOTE — Patient Instructions (Signed)
Medication Instructions:  Your physician recommends that you continue on your current medications as directed. Please refer to the Current Medication list given to you today.  *If you need a refill on your cardiac medications before your next appointment, please call your pharmacy*  Lab Work: BMET [place] TO CT IN [facility]   If you have labs (blood work) drawn today and your tests are completely normal, you will receive your results only by: Marland Kitchen MyChart Message (if you have MyChart) OR . A paper copy in the mail If you have any lab test that is abnormal or we need to change your treatment, we will call you to review the results.  Testing/Procedures: THYROID ULTRASOUND IN February  CTA OF CHEST IN MARCH  THE OFFICE WILL CALL YOU TO SCHEDULE    Follow-Up: At [facility], you and your health needs are our priority.  As part of our continuing mission to provide you with exceptional heart care, we have created designated Provider Care Teams.  These Care Teams include your primary Cardiologist (physician) and Advanced Practice Providers (APPs -  Physician Assistants and Nurse Practitioners) who all work together to provide you with the care you need, when you need it.  We recommend signing up for the patient portal called "MyChart".  Sign up information is provided on this After Visit Summary.  MyChart is used to connect with patients for Virtual Visits (Telemedicine).  Patients are able to view lab/test results, encounter notes, upcoming appointments, etc.  Non-urgent messages can be sent to your provider as well.   To learn more about what you can do with MyChart, go to [email or web address].    Your next appointment:   12 month(s)  You will receive a reminder letter in the mail two months in advance. If you don't receive a letter, please call our office to schedule the follow-up appointment.  The format for your next appointment:   In Person  Provider:   You may see DR Stanford Breed  or  one of the following Advanced Practice Providers on your designated Care Team:    Kerin Ransom, PA-C  [place], [place]  Coletta Memos, [place]

## 2020-06-25 ENCOUNTER — Ambulatory Visit (INDEPENDENT_AMBULATORY_CARE_PROVIDER_SITE_OTHER): Payer: Medicare Other

## 2020-06-25 ENCOUNTER — Other Ambulatory Visit: Payer: Self-pay

## 2020-06-25 DIAGNOSIS — Z23 Encounter for immunization: Secondary | ICD-10-CM

## 2020-07-15 ENCOUNTER — Telehealth: Payer: Self-pay

## 2020-07-15 NOTE — Telephone Encounter (Signed)
Pt called and advised that [facility] is requesting to fill out a new order advising of Cpap setting of 14 for pt new machine . Please advise when written out . Thanks [employer]

## 2020-07-16 NOTE — Telephone Encounter (Signed)
ok 

## 2020-07-21 NOTE — Telephone Encounter (Signed)
Please write out the script with pressure setting at 14. Thanks [employer]

## 2020-08-01 DIAGNOSIS — G4733 Obstructive sleep apnea (adult) (pediatric): Secondary | ICD-10-CM | POA: Diagnosis not present

## 2020-08-11 ENCOUNTER — Ambulatory Visit: Payer: PPO | Admitting: Family Medicine

## 2020-09-02 DIAGNOSIS — G4733 Obstructive sleep apnea (adult) (pediatric): Secondary | ICD-10-CM | POA: Diagnosis not present

## 2020-09-07 ENCOUNTER — Other Ambulatory Visit: Payer: Self-pay | Admitting: Family Medicine

## 2020-09-07 DIAGNOSIS — J309 Allergic rhinitis, unspecified: Secondary | ICD-10-CM

## 2020-09-08 ENCOUNTER — Ambulatory Visit: Payer: PPO | Admitting: Family Medicine

## 2020-10-02 DIAGNOSIS — G4733 Obstructive sleep apnea (adult) (pediatric): Secondary | ICD-10-CM | POA: Diagnosis not present

## 2020-10-07 ENCOUNTER — Encounter: Payer: Self-pay | Admitting: Family Medicine

## 2020-10-07 ENCOUNTER — Ambulatory Visit (INDEPENDENT_AMBULATORY_CARE_PROVIDER_SITE_OTHER): Payer: PPO | Admitting: Family Medicine

## 2020-10-07 ENCOUNTER — Other Ambulatory Visit: Payer: Self-pay

## 2020-10-07 VITALS — BP 122/80 | HR 51 | Temp 97.0°F | Wt 283.2 lb

## 2020-10-07 DIAGNOSIS — I10 Essential (primary) hypertension: Secondary | ICD-10-CM

## 2020-10-07 DIAGNOSIS — R202 Paresthesia of skin: Secondary | ICD-10-CM | POA: Diagnosis not present

## 2020-10-07 DIAGNOSIS — R7302 Impaired glucose tolerance (oral): Secondary | ICD-10-CM | POA: Diagnosis not present

## 2020-10-07 NOTE — Progress Notes (Signed)
   Subjective:    Patient ID: Lee Kim, male    DOB: January 02, 1946, 76 y.o.   MRN: [id number]  HPI He is here for recheck on his blood pressure.  He continues on Norvasc and lisinopril/HCTZ.  He also has questions concerning tingling in his feet.  No pain associated with this, falling, weakness.  He does have a history of glucose intolerance.   Review of Systems     Objective:   Physical Exam Alert and in no distress.  Blood pressure is recorded.       Assessment & Plan:  Essential hypertension  Glucose intolerance (impaired glucose tolerance)  Tingling of both feet He will continue on his present medication regimen.  I then discussed the glucose intolerance in regard to his tingling and explained that the tingling is probably not diabetes related.  He did mention occasional tingling in his hands and occasional difficulty with dizziness.  He is scheduled for annual wellness visit in several months.  Recommend that we revisit these issues with his appointment.  He was comfortable with that.  Discussed the possibility of referring him to neurology for further evaluation of the tingling. 31 minutes spent in discussion of his blood pressure, glucose intolerance, tingling and other unrelated symptoms.

## 2020-10-13 ENCOUNTER — Other Ambulatory Visit: Payer: Self-pay | Admitting: Family Medicine

## 2020-10-13 DIAGNOSIS — L409 Psoriasis, unspecified: Secondary | ICD-10-CM

## 2020-10-13 NOTE — Telephone Encounter (Signed)
[facility] is requesting to fill pt triamcinolone. Please advise [place]

## 2020-11-02 DIAGNOSIS — G4733 Obstructive sleep apnea (adult) (pediatric): Secondary | ICD-10-CM | POA: Diagnosis not present

## 2020-11-17 ENCOUNTER — Telehealth: Payer: Self-pay | Admitting: *Deleted

## 2020-11-17 ENCOUNTER — Ambulatory Visit
Admission: RE | Admit: 2020-11-17 | Discharge: 2020-11-17 | Disposition: A | Discharge status: Home / Self Care | Payer: PPO | Source: Ambulatory Visit | Attending: Cardiology | Admitting: Cardiology

## 2020-11-17 DIAGNOSIS — E041 Nontoxic single thyroid nodule: Secondary | ICD-10-CM | POA: Diagnosis not present

## 2020-11-17 NOTE — Telephone Encounter (Addendum)
Left message for pt to call    ----- Message from Lelon Perla, MD sent at 11/17/2020 10:58 AM EST ----- Please arrange thyroid bx and see if pt can FU with primary care for this issue. Kirk Ruths

## 2020-11-17 NOTE — Telephone Encounter (Signed)
These nodules are new based on report; please ask pt to fu with primary care for this issue Kirk Ruths

## 2020-11-17 NOTE — Telephone Encounter (Signed)
Spoke with pt, aware will forward report to dr Redmond School and to follow up with him regarding the need to repeat. Pt agreed with this plan.

## 2020-11-17 NOTE — Telephone Encounter (Signed)
Spoke with pt, Aware of dr Jacalyn Lefevre recommendations. He reports he has had 2 biopsies already in 2018 and everything was fine. Will make dr Stanford Breed aware.

## 2020-11-18 DIAGNOSIS — H16211 Exposure keratoconjunctivitis, right eye: Secondary | ICD-10-CM | POA: Diagnosis not present

## 2020-11-18 DIAGNOSIS — Z961 Presence of intraocular lens: Secondary | ICD-10-CM | POA: Diagnosis not present

## 2020-11-18 DIAGNOSIS — H35351 Cystoid macular degeneration, right eye: Secondary | ICD-10-CM | POA: Diagnosis not present

## 2020-11-18 DIAGNOSIS — H401131 Primary open-angle glaucoma, bilateral, mild stage: Secondary | ICD-10-CM | POA: Diagnosis not present

## 2020-11-18 DIAGNOSIS — H2512 Age-related nuclear cataract, left eye: Secondary | ICD-10-CM | POA: Diagnosis not present

## 2020-11-18 DIAGNOSIS — H348312 Tributary (branch) retinal vein occlusion, right eye, stable: Secondary | ICD-10-CM | POA: Diagnosis not present

## 2020-11-18 DIAGNOSIS — Z9889 Other specified postprocedural states: Secondary | ICD-10-CM | POA: Diagnosis not present

## 2020-11-18 DIAGNOSIS — H353131 Nonexudative age-related macular degeneration, bilateral, early dry stage: Secondary | ICD-10-CM | POA: Diagnosis not present

## 2020-11-19 ENCOUNTER — Encounter (INDEPENDENT_AMBULATORY_CARE_PROVIDER_SITE_OTHER): Payer: Self-pay | Admitting: Ophthalmology

## 2020-11-19 ENCOUNTER — Encounter (INDEPENDENT_AMBULATORY_CARE_PROVIDER_SITE_OTHER): Payer: PPO | Admitting: Ophthalmology

## 2020-11-19 ENCOUNTER — Other Ambulatory Visit: Payer: Self-pay

## 2020-11-19 ENCOUNTER — Ambulatory Visit (INDEPENDENT_AMBULATORY_CARE_PROVIDER_SITE_OTHER): Payer: PPO | Admitting: Ophthalmology

## 2020-11-19 ENCOUNTER — Other Ambulatory Visit: Payer: Self-pay | Admitting: Family Medicine

## 2020-11-19 ENCOUNTER — Other Ambulatory Visit: Payer: Self-pay | Admitting: Physician Assistant

## 2020-11-19 DIAGNOSIS — H348322 Tributary (branch) retinal vein occlusion, left eye, stable: Secondary | ICD-10-CM | POA: Diagnosis not present

## 2020-11-19 DIAGNOSIS — H348312 Tributary (branch) retinal vein occlusion, right eye, stable: Secondary | ICD-10-CM

## 2020-11-19 DIAGNOSIS — E041 Nontoxic single thyroid nodule: Secondary | ICD-10-CM | POA: Diagnosis not present

## 2020-11-19 DIAGNOSIS — Z8669 Personal history of other diseases of the nervous system and sense organs: Secondary | ICD-10-CM | POA: Diagnosis not present

## 2020-11-19 DIAGNOSIS — H35011 Changes in retinal vascular appearance, right eye: Secondary | ICD-10-CM | POA: Diagnosis not present

## 2020-11-19 MED ORDER — FLUORESCEIN SODIUM 10 % IV SOLN
500.0000 mg | INTRAVENOUS | Status: AC | PRN
  Administered 2020-11-19: 500 mg via INTRAVENOUS

## 2020-11-19 NOTE — Assessment & Plan Note (Signed)
No specific therapy warranted left eye

## 2020-11-19 NOTE — Progress Notes (Signed)
11/19/2020     CHIEF COMPLAINT Patient presents for Retina Evaluation (NP CME OD - Ref'd by Dr. Herbie [place] Groat//Pt denies any recent changes to [place]. Pt denies flashes or ocular pain. No distortion. Pt reports hx of floaters off and on OD x 2 years.)   HISTORY OF PRESENT ILLNESS: Lee Kim is a 75 y.o. male who presents to the clinic today for:   HPI    Retina Evaluation    In right eye.  This started 2 years ago.  Duration of 2 years.  Associated Symptoms Floaters.  Treatments tried include no treatments. Additional comments: NP CME OD - Ref'd by Dr. Clent Jacks  Pt denies any recent changes to [place]. Pt denies flashes or ocular pain. No distortion. Pt reports hx of floaters off and on OD x 2 years.       Last edited by Rockie Neighbours, [place] on 11/19/2020 10:02 AM. (History)      Referring physician: [name], MD [place],  [place] [id number]  HISTORICAL INFORMATION:   Selected notes from the MEDICAL RECORD NUMBER    Lab Results  Component Value Date   HGBA1C 6.0 (A) 06/11/2020     CURRENT MEDICATIONS: Current Outpatient Medications (Ophthalmic Drugs)  Medication Sig  . TRAVATAN Z 0.004 % SOLN ophthalmic solution Place 1 drop into the right eye at bedtime.    No current facility-administered medications for this visit. (Ophthalmic Drugs)   Current Outpatient Medications (Other)  Medication Sig  . amLODipine (NORVASC) 10 MG tablet Take 1 tablet (10 mg total) by mouth daily.  Marland Kitchen apixaban (ELIQUIS) 5 MG TABS tablet TAKE 1 TABLET(5 MG) BY MOUTH TWICE DAILY  . cromolyn (NASALCROM) 5.2 MG/ACT nasal spray Place 1 spray into both nostrils 2 (two) times daily.   Marland Kitchen lisinopril-hydrochlorothiazide (ZESTORETIC) 10-12.5 MG tablet TAKE 1 TABLET BY MOUTH DAILY  . Multiple Vitamin (MULTIVITAMIN) tablet Take 1 tablet by mouth daily.  . Multiple Vitamins-Minerals (PRESERVISION AREDS 2 PO) Take 1 tablet by mouth 2 (two) times daily.   . rosuvastatin  (CRESTOR) 40 MG tablet Take 1 tablet (40 mg total) by mouth daily.  . tadalafil (CIALIS) 20 MG tablet TAKE 1 TABLET BY MOUTH EVERY DAY AS NEEDED  . triamcinolone (KENALOG) 0.1 % APPLY EXTERNALLY TO THE AFFECTED AREA TWICE DAILY   No current facility-administered medications for this visit. (Other)      REVIEW OF SYSTEMS:    ALLERGIES Allergies  Allergen Reactions  . Benzalkonium Chloride     Causes Eye issues Type of preservative     PAST MEDICAL HISTORY Past Medical History:  Diagnosis Date  . Colonic polyp   . Diverticulitis   . Erythrocytosis   . Hypertension   . Obesity   . OSA (obstructive sleep apnea)    Past Surgical History:  Procedure Laterality Date  . COLONOSCOPY  03/2003   Dr. Wynetta Emery  . COLONOSCOPY WITH PROPOFOL N/A 11/23/2016   Procedure: COLONOSCOPY WITH PROPOFOL;  Surgeon: Garlan Fair, MD;  Location: [facility];  Service: Endoscopy;  Laterality: N/A;  . EYE SURGERY      FAMILY HISTORY Family History  Problem Relation Age of Onset  . Hypertension Mother   . Hypertension Father   . Hypertension Sister     SOCIAL HISTORY Social History   Tobacco Use  . Smoking status: Current Every Day Smoker    Types: Cigarettes  . Smokeless tobacco: Never Used  Vaping Use  . Vaping  Use: Never used  Substance Use Topics  . Alcohol use: Yes    Alcohol/week: 7.0 standard drinks    Types: 7 Glasses of wine per week    Comment: 3-4 glasses wine per night  . Drug use: No         OPHTHALMIC EXAM:  Base Eye Exam    Visual Acuity (ETDRS)      Right Left   Dist cc 20/25 +2 20/25 +2   Correction: Glasses       Tonometry (Tonopen, 10:03 AM)      Right Left   Pressure 17 10       Pupils      Pupils Dark Light Shape React APD   Right PERRL 4 3 Round Brisk None   Left PERRL 4 3 Round Brisk None       Visual Fields (Counting fingers)      Left Right    Full Full       Extraocular Movement      Right Left    Full Full        Neuro/Psych    Oriented x3: Yes   Mood/Affect: Normal       Dilation    Both eyes: 1.0% Mydriacyl, 2.5% Phenylephrine @ 10:06 AM        Slit Lamp and Fundus Exam    Slit Lamp Exam      Right Left   Lens Posterior chamber intraocular lens, Centered posterior chamber intraocular lens        Fundus Exam      Right Left   Posterior Vitreous Avitric Posterior vitreous detachment   Disc 1+ Pallor Normal   C/D Ratio 0.3 0.4   Macula Hard drusen, no thickening through the fovea, some diffuse thickening inferior to the fovea Soft drusen, 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  No signs of neovascularization Apparent old retinal vein occlusion macula, inferotemporal with shunt vessels in the watershed region temporal to the fovea, no exudates   Periphery Large chorioretinal scar temporally, and inferiorly, good retinopexy, retina attached Inferotemporal RPE atrophy, no obvious retinopexy, and no retinal breaks or tears noted          IMAGING AND PROCEDURES  Imaging and Procedures for 11/19/20  OCT, Retina - OU - Both Eyes       Right Eye Quality was good. Scan locations included subfoveal. Central Foveal Thickness: 353. Progression has no prior data. Findings include abnormal foveal contour, cystoid macular edema.   Left Eye Quality was good. Scan locations included subfoveal. Central Foveal Thickness: 314. Progression has no prior data. Findings include retinal drusen , abnormal foveal contour.   Notes Inferior to the fovea and not in the region of prior branch retinal vein occlusion thus this is associate with another condition, will need fluorescein angiographic evaluation right and left eye       Color Fundus Photography Optos - OU - Both Eyes       Right Eye Progression has no prior data. Disc  findings include normal observations. Macula : microaneurysms.   Left Eye Progression has no prior data.   Notes Old superotemporal branch retinal vein occlusion in a sector fashion superiorly now well compensated with dilated Blood vessels capillaries and shunt vessels in the temporal window with a macular.  No incipient CME centrally.   Early filling  of the right eyes does confirm the presence of clinically suspicious retinal artery macro aneurysm with flow voids which does suggest that this is a fibrotic, spontaneously healing condition.  Perilesional edema does not affect or threaten the fovea at this time.  With enlarged foveal avascular zone from the prior retinal vein occlusion, no laser photocoagulation should be undertaken, moreover I would avoid antivegF medications because this might in fact induce ischemia of the remainder of the capillary remodeled areas in the superotemporal quadrant of this right eye.  Left eye similarly has a large region Of prior retinal vein occlusion temporally superotemporally by fluorescein angiographic evaluation with shunt vessels and collateralization occurring temporally with no current CME centrally       Fluorescein Angiography Optos (Transit OD)       Injection:  500 mg Fluorescein Sodium 10 % injection   NDC: [phone]   Route: IntravenousRight Eye   Progression has no prior data. Early phase findings include leakage, window defect. Mid/Late phase findings include leakage, window defect.   Left Eye   Progression has no prior data.   Notes Old superotemporal branch retinal vein occlusion in a sector fashion  superiorly now well compensated with dilated Blood vessels capillaries and  shunt vessels in the temporal window with a macular. No incipient CME  centrally.  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   Perilesional edema does not affect or threaten the fovea at this time.   With enlarged foveal avascular zone from the prior retinal vein occlusion,  no laser photocoagulation should be undertaken, moreover I would avoid  antivegF medications because this might in fact induce ischemia of the  remainder of the capillary remodeled areas in the superotemporal quadrant  of this right eye.   Left eye similarly has a large region Of prior retinal vein occlusion  temporally superotemporally by fluorescein angiographic evaluation with  shunt vessels and collateralization occurring temporally with no current  CME centrally                ASSESSMENT/PLAN:  Branch retinal vein occlusion, right eye In the absence of definable CME, will observe, no therapy is warranted intravitreal OD for this condition  Branch retinal vein occlusion, left eye No specific therapy warranted left eye  Retinal macroaneurysm of right eye Confirmed because of macular edema inferior to the fovea the right eye.  There is a fibrotic lesion seen clinically appears to be in the mid retina clinically but it does straddle a retinal branch artery.  This was confirmed by fluorescein angiographic evaluation today.  There appears to be flow-voids in the retinal artery macro aneurysm which suggest early fibrosis is ongoing and that spontaneous healing is occurring in this condition of the right eye.  I will discussed with the patient that laser to this area would be in my opinion inappropriate.  Direct laser to the RAM has been associated with retinal artery occlusions even in a retrograde fashion, personal experience and training over 25 years ago       ICD-10-CM   1. Retinal macroaneurysm of right eye  H35.011 OCT, Retina - OU - Both Eyes    Color Fundus Photography Optos - OU - Both Eyes    Fluorescein Angiography Optos (Transit OD)    Fluorescein Sodium 10 % injection 500 mg   2. 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 Stable branch retinal vein occlusion of left eye  H34.8322 OCT, Retina - OU - Both Eyes    Color Fundus Photography Optos - OU - Both Eyes    Fluorescein Angiography Optos (Transit OD)    Fluorescein Sodium 10 % injection 500 mg  4. History of retinal detachment  Z86.69     1.  Findings were reviewed with the patient.  He understands the reason for continue to simply observe this condition of the right eye and he will self monitor at home in a monocular basis with reading to confirm the right I can still read in his usual and customary fashion typical reading material.  Should this decline, he will contact the office prior to scheduled next follow-up.  2.  If upon next visit dilating right eye only, CME is continued to subside in the right eye, will continue the observational mode of therapy  3.  Ophthalmic Meds Ordered this visit:  Meds ordered this encounter  Medications  . Fluorescein Sodium 10 % injection 500 mg       Return in about 6 weeks (around 12/31/2020) for dilate, OD, OCT.  There are no Patient Instructions on file for this visit.   Explained the diagnoses, plan, and follow up with the patient and they expressed understanding.  Patient expressed understanding of the importance of proper follow up care.   Clent Demark Rhiley Solem M.D. Diseases & Surgery of the Retina and Vitreous Retina [facility] Diabetic [place] 11/19/20     Abbreviations: M myopia (nearsighted); A astigmatism; H hyperopia (farsighted); P presbyopia; Mrx spectacle prescription;  CTL contact lenses; OD right eye; OS left eye; OU both eyes  XT exotropia; ET esotropia; PEK punctate epithelial keratitis; PEE punctate epithelial erosions; DES dry eye syndrome; MGD meibomian gland  dysfunction; ATs artificial tears; PFAT's preservative free artificial tears; [place] nuclear sclerotic cataract; PSC posterior subcapsular cataract; ERM epi-retinal membrane; PVD posterior vitreous detachment; RD retinal detachment; DM diabetes mellitus; DR diabetic retinopathy; NPDR non-proliferative diabetic retinopathy; PDR proliferative diabetic retinopathy; CSME clinically significant macular edema; DME diabetic macular edema; dbh dot blot hemorrhages; CWS cotton wool spot; POAG primary open angle glaucoma; C/D cup-to-disc ratio; HVF humphrey visual field; GVF goldmann visual field; OCT optical coherence tomography; IOP intraocular pressure; BRVO Branch retinal vein occlusion; CRVO central retinal vein occlusion; CRAO central retinal artery occlusion; BRAO branch retinal artery occlusion; RT retinal tear; SB scleral buckle; PPV pars plana vitrectomy; VH Vitreous hemorrhage; PRP panretinal laser photocoagulation; IVK intravitreal kenalog; VMT vitreomacular traction; MH Macular hole;  NVD neovascularization of the disc; NVE neovascularization elsewhere; AREDS age related eye disease study; ARMD age related macular degeneration; POAG primary open angle glaucoma; EBMD epithelial/anterior basement membrane dystrophy; ACIOL anterior chamber intraocular lens; IOL intraocular lens; PCIOL posterior chamber intraocular lens; Phaco/IOL phacoemulsification with intraocular lens placement; [place] photorefractive keratectomy; LASIK laser assisted in situ keratomileusis; HTN hypertension; DM diabetes mellitus; COPD chronic obstructive pulmonary disease

## 2020-11-19 NOTE — Assessment & Plan Note (Signed)
In the absence of definable CME, will observe, no therapy is warranted intravitreal OD for this condition

## 2020-11-19 NOTE — Assessment & Plan Note (Signed)
Confirmed because of macular edema inferior to the fovea the right eye.  There is a fibrotic lesion seen clinically appears to be in the mid retina clinically but it does straddle a retinal branch artery.  This was confirmed by fluorescein angiographic evaluation today.  There appears to be flow-voids in the retinal artery macro aneurysm which suggest early fibrosis is ongoing and that spontaneous healing is occurring in this condition of the right eye.  I will discussed with the patient that laser to this area would be in my opinion inappropriate.  Direct laser to the RAM has been associated with retinal artery occlusions even in a retrograde fashion, personal experience and training over 25 years ago

## 2020-11-27 ENCOUNTER — Other Ambulatory Visit (HOSPITAL_COMMUNITY)
Admission: RE | Admit: 2020-11-27 | Discharge: 2020-11-27 | Disposition: A | Discharge status: Home / Self Care | Payer: PPO | Source: Ambulatory Visit | Attending: Student | Admitting: Student

## 2020-11-27 ENCOUNTER — Other Ambulatory Visit: Payer: Self-pay

## 2020-11-27 ENCOUNTER — Ambulatory Visit
Admission: RE | Admit: 2020-11-27 | Discharge: 2020-11-27 | Disposition: A | Discharge status: Home / Self Care | Payer: PPO | Source: Ambulatory Visit | Attending: Family Medicine | Admitting: Family Medicine

## 2020-11-27 DIAGNOSIS — D34 Benign neoplasm of thyroid gland: Secondary | ICD-10-CM | POA: Insufficient documentation

## 2020-11-27 DIAGNOSIS — E041 Nontoxic single thyroid nodule: Secondary | ICD-10-CM

## 2020-11-28 LAB — CYTOLOGY - NON PAP

## 2020-11-28 NOTE — Progress Notes (Signed)
Late entry:  Patient presented to the clinic 11/27/20 for US-guided thyroid biopsies of right and left inferior nodules. These nodules are labeled 3 and 4 respectively on the US Thyroid dated 11/17/20.   The right inferior nodule biopsy was not completed due to the inability to reach the nodule and confirm needle placement on ultrasound. This nodule is very inferior and very deep. Two attempts were made with a 25 gauge needle and one final attempt was made with an INRAD core biopsy needle.   I explained to the patient that I was unable to reach the nodule and that he would need to come back another day when a different provider could perform the procedure. He verbalized understanding.   Left inferior nodule was the only nodule biopsied with samples sent to pathology and for Afirma testing.  Soyla Dryer, [place] [phone] 11/28/2020, 8:11 AM

## 2020-12-01 ENCOUNTER — Other Ambulatory Visit: Payer: Self-pay | Admitting: Family Medicine

## 2020-12-01 DIAGNOSIS — G4733 Obstructive sleep apnea (adult) (pediatric): Secondary | ICD-10-CM | POA: Diagnosis not present

## 2020-12-01 DIAGNOSIS — E041 Nontoxic single thyroid nodule: Secondary | ICD-10-CM

## 2020-12-04 ENCOUNTER — Ambulatory Visit
Admission: RE | Admit: 2020-12-04 | Discharge: 2020-12-04 | Disposition: A | Discharge status: Home / Self Care | Payer: PPO | Source: Ambulatory Visit | Attending: Family Medicine | Admitting: Family Medicine

## 2020-12-04 ENCOUNTER — Other Ambulatory Visit (HOSPITAL_COMMUNITY)
Admission: RE | Admit: 2020-12-04 | Discharge: 2020-12-04 | Disposition: A | Discharge status: Home / Self Care | Payer: PPO | Source: Ambulatory Visit | Attending: Diagnostic Radiology | Admitting: Diagnostic Radiology

## 2020-12-04 ENCOUNTER — Ambulatory Visit
Admission: RE | Admit: 2020-12-04 | Discharge: 2020-12-04 | Disposition: A | Discharge status: Home / Self Care | Payer: PPO | Source: Ambulatory Visit

## 2020-12-04 DIAGNOSIS — E041 Nontoxic single thyroid nodule: Secondary | ICD-10-CM | POA: Insufficient documentation

## 2020-12-04 DIAGNOSIS — R896 Abnormal cytological findings in specimens from other organs, systems and tissues: Secondary | ICD-10-CM | POA: Diagnosis not present

## 2020-12-05 ENCOUNTER — Other Ambulatory Visit: Payer: Self-pay | Admitting: Family Medicine

## 2020-12-05 ENCOUNTER — Other Ambulatory Visit: Payer: Self-pay

## 2020-12-05 DIAGNOSIS — E041 Nontoxic single thyroid nodule: Secondary | ICD-10-CM

## 2020-12-09 DIAGNOSIS — G4733 Obstructive sleep apnea (adult) (pediatric): Secondary | ICD-10-CM | POA: Diagnosis not present

## 2020-12-10 ENCOUNTER — Other Ambulatory Visit: Payer: Self-pay

## 2020-12-10 ENCOUNTER — Ambulatory Visit
Admission: RE | Admit: 2020-12-10 | Discharge: 2020-12-10 | Disposition: A | Discharge status: Home / Self Care | Payer: PPO | Source: Ambulatory Visit | Attending: Cardiology | Admitting: Cardiology

## 2020-12-10 DIAGNOSIS — I712 Thoracic aortic aneurysm, without rupture, unspecified: Secondary | ICD-10-CM

## 2020-12-10 DIAGNOSIS — I771 Stricture of artery: Secondary | ICD-10-CM | POA: Diagnosis not present

## 2020-12-10 DIAGNOSIS — I708 Atherosclerosis of other arteries: Secondary | ICD-10-CM | POA: Diagnosis not present

## 2020-12-10 DIAGNOSIS — J849 Interstitial pulmonary disease, unspecified: Secondary | ICD-10-CM | POA: Diagnosis not present

## 2020-12-10 MED ORDER — IOPAMIDOL (ISOVUE-370) INJECTION 76%
75.0000 mL | Freq: Once | INTRAVENOUS | Status: AC | PRN
  Administered 2020-12-10: 75 mL via INTRAVENOUS

## 2020-12-11 LAB — CYTOLOGY - NON PAP

## 2020-12-12 DIAGNOSIS — E041 Nontoxic single thyroid nodule: Secondary | ICD-10-CM | POA: Diagnosis not present

## 2020-12-30 ENCOUNTER — Encounter (HOSPITAL_COMMUNITY): Payer: Self-pay

## 2020-12-31 ENCOUNTER — Ambulatory Visit (INDEPENDENT_AMBULATORY_CARE_PROVIDER_SITE_OTHER): Payer: PPO | Admitting: Ophthalmology

## 2020-12-31 ENCOUNTER — Other Ambulatory Visit: Payer: Self-pay

## 2020-12-31 ENCOUNTER — Encounter (INDEPENDENT_AMBULATORY_CARE_PROVIDER_SITE_OTHER): Payer: Self-pay | Admitting: Ophthalmology

## 2020-12-31 ENCOUNTER — Other Ambulatory Visit: Payer: Self-pay | Admitting: Family Medicine

## 2020-12-31 DIAGNOSIS — H35011 Changes in retinal vascular appearance, right eye: Secondary | ICD-10-CM | POA: Diagnosis not present

## 2020-12-31 DIAGNOSIS — H348312 Tributary (branch) retinal vein occlusion, right eye, stable: Secondary | ICD-10-CM

## 2020-12-31 DIAGNOSIS — H353132 Nonexudative age-related macular degeneration, bilateral, intermediate dry stage: Secondary | ICD-10-CM | POA: Diagnosis not present

## 2020-12-31 DIAGNOSIS — G4733 Obstructive sleep apnea (adult) (pediatric): Secondary | ICD-10-CM | POA: Diagnosis not present

## 2020-12-31 DIAGNOSIS — I482 Chronic atrial fibrillation, unspecified: Secondary | ICD-10-CM

## 2020-12-31 NOTE — Assessment & Plan Note (Signed)

## 2020-12-31 NOTE — Progress Notes (Signed)
12/31/2020     CHIEF COMPLAINT Patient presents for Retina Follow Up (6 Wk F/U OD//Pt denies noticeable changes to [place] OU since last visit. Pt denies ocular pain, flashes of light, or floaters OU. //)   HISTORY OF PRESENT ILLNESS: Lee Kim is a 75 y.o. male who presents to the clinic today for:   HPI    Retina Follow Up    Patient presents with  Other.  In right eye.  This started 6 weeks ago.  Severity is mild.  Duration of 6 weeks.  Since onset it is stable. Additional comments: 6 Wk F/U OD  Pt denies noticeable changes to [place] OU since last visit. Pt denies ocular pain, flashes of light, or floaters OU.          Last edited by Rockie Neighbours, [place] on 12/31/2020 10:05 AM. (History)      Referring physician: [name], MD [place],  [place] [id number]  HISTORICAL INFORMATION:   Selected notes from the MEDICAL RECORD NUMBER    Lab Results  Component Value Date   HGBA1C 6.0 (A) 06/11/2020     CURRENT MEDICATIONS: Current Outpatient Medications (Ophthalmic Drugs)  Medication Sig  . TRAVATAN Z 0.004 % SOLN ophthalmic solution Place 1 drop into the right eye at bedtime.    No current facility-administered medications for this visit. (Ophthalmic Drugs)   Current Outpatient Medications (Other)  Medication Sig  . amLODipine (NORVASC) 10 MG tablet Take 1 tablet (10 mg total) by mouth daily.  . cromolyn (NASALCROM) 5.2 MG/ACT nasal spray Place 1 spray into both nostrils 2 (two) times daily.   Marland Kitchen ELIQUIS 5 MG TABS tablet TAKE 1 TABLET(5 MG) BY MOUTH TWICE DAILY  . lisinopril-hydrochlorothiazide (ZESTORETIC) 10-12.5 MG tablet TAKE 1 TABLET BY MOUTH DAILY  . Multiple Vitamin (MULTIVITAMIN) tablet Take 1 tablet by mouth daily.  . Multiple Vitamins-Minerals (PRESERVISION AREDS 2 PO) Take 1 tablet by mouth 2 (two) times daily.   . rosuvastatin (CRESTOR) 40 MG tablet Take 1 tablet (40 mg total) by mouth daily.  . tadalafil (CIALIS) 20 MG tablet TAKE 1  TABLET BY MOUTH EVERY DAY AS NEEDED  . triamcinolone (KENALOG) 0.1 % APPLY EXTERNALLY TO THE AFFECTED AREA TWICE DAILY   No current facility-administered medications for this visit. (Other)      REVIEW OF SYSTEMS:    ALLERGIES Allergies  Allergen Reactions  . Benzalkonium Chloride     Causes Eye issues Type of preservative     PAST MEDICAL HISTORY Past Medical History:  Diagnosis Date  . Colonic polyp   . Diverticulitis   . Erythrocytosis   . Hypertension   . Obesity   . OSA (obstructive sleep apnea)    Past Surgical History:  Procedure Laterality Date  . COLONOSCOPY  03/2003   Dr. Wynetta Emery  . COLONOSCOPY WITH PROPOFOL N/A 11/23/2016   Procedure: COLONOSCOPY WITH PROPOFOL;  Surgeon: Garlan Fair, MD;  Location: [facility];  Service: Endoscopy;  Laterality: N/A;  . EYE SURGERY      FAMILY HISTORY Family History  Problem Relation Age of Onset  . Hypertension Mother   . Hypertension Father   . Hypertension Sister     SOCIAL HISTORY Social History   Tobacco Use  . Smoking status: Current Every Day Smoker    Types: Cigarettes  . Smokeless tobacco: Never Used  Vaping Use  . Vaping Use: Never used  Substance Use Topics  . Alcohol use: Yes  Alcohol/week: 7.0 standard drinks    Types: 7 Glasses of wine per week    Comment: 3-4 glasses wine per night  . Drug use: No         OPHTHALMIC EXAM: Base Eye Exam    Visual Acuity (ETDRS)      Right Left   Dist cc 20/25 +1 20/25 -1   Correction: Glasses       Tonometry (Tonopen, 10:05 AM)      Right Left   Pressure 17 13       Pupils      Pupils Dark Light Shape React APD   Right PERRL 5 4 Round Brisk None   Left PERRL 5 4 Round Brisk None       Visual Fields (Counting fingers)      Left Right    Full Full       Extraocular Movement      Right Left    Full Full       Neuro/Psych    Oriented x3: Yes   Mood/Affect: Normal       Dilation    Right eye: 1.0% Mydriacyl, 2.5%  Phenylephrine @ 10:09 AM        Slit Lamp and Fundus Exam    External Exam      Right Left   External Normal Normal       Slit Lamp Exam      Right Left   Lids/Lashes Normal Normal   Conjunctiva/Sclera White and quiet White and quiet   Cornea Clear Clear   Anterior Chamber Deep and quiet Deep and quiet   Iris Round and reactive Round and reactive   Lens Posterior chamber intraocular lens, Centered posterior chamber intraocular lens Clear   Anterior Vitreous Normal Normal       Fundus Exam      Right Left   Posterior Vitreous Avitric    Disc 1+ Pallor    C/D Ratio 0.3    Macula Hard drusen, no thickening through the fovea, some diffuse thickening inferior to the fovea    Vessels Old retinal vein occlusion superotemporal to the nerve with collateralization of the vessels.  No signs of neovascularization    Periphery Large chorioretinal scar temporally, and inferiorly, good retinopexy, retina attached           IMAGING AND PROCEDURES  Imaging and Procedures for 12/31/20  OCT, Retina - OU - Both Eyes       Right Eye Quality was good. Scan locations included subfoveal. Central Foveal Thickness: 351.   Left Eye Quality was good. Scan locations included subfoveal. Central Foveal Thickness: 312.   Notes Much less intraretinal fluid and CME, none center involved threatening OD, spontaneous resolution ongoing.  We will continue to monitor and observe                ASSESSMENT/PLAN:  Retinal macroaneurysm of right eye Documented on prior examination with localized by fluorescein angiography performed 2-16 2022, and now with ongoing spontaneous resolution as we would expect  Branch retinal vein occlusion, right eye Old BRVO OD, not active and no CME active  Intermediate stage nonexudative age-related macular degeneration of both eyes The nature of age--related macular degeneration was discussed with the patient as well as the distinction between dry and wet  types. Checking an Amsler Grid daily with advice to return immediately should a distortion develop, was given to the patient. The patient 's smoking status now and in the past was determined  and advice based on the AREDS study was provided regarding the consumption of antioxidant supplements. AREDS 2 vitamin formulation was recommended. Consumption of dark leafy vegetables and fresh fruits of various colors was recommended. Treatment modalities for wet macular degeneration particularly the use of intravitreal injections of anti-blood vessel growth factors was discussed with the patient. Avastin, Lucentis, and Eylea are the available options. On occasion, therapy includes the use of photodynamic therapy and thermal laser. Stressed to the patient do not rub eyes.  Patient was advised to check Amsler Grid daily and return immediately if changes are noted. Instructions on using the grid were given to the patient. All patient questions were answered.      ICD-10-CM   1. 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 Retinal macroaneurysm of right eye  H35.011   3. Intermediate stage nonexudative age-related macular degeneration of both eyes  H35.3132     1.  Much less macular edema inferior to the fovea around the prior retinal artery macular aneurysm documented by  [facility] 11/19/2020.  Spontaneous resolution continues OD, will observe  2.  Acuity OU preserved and stable  3.  Ophthalmic Meds Ordered this visit:  No orders of the defined types were placed in this encounter.      Return in about 3 months (around 04/02/2021) for dilate, OD, OCT.  There are no Patient Instructions on file for this visit.   Explained the diagnoses, plan, and follow up with the patient and they expressed understanding.  Patient expressed understanding of the importance of proper follow up care.   Clent Demark Kaylum Shrum M.D. Diseases & Surgery of the Retina and Vitreous Retina &  Diabetic [place] 12/31/20     Abbreviations: M myopia (nearsighted); A astigmatism; H hyperopia (farsighted); P presbyopia; Mrx spectacle prescription;  CTL contact lenses; OD right eye; OS left eye; OU both eyes  XT exotropia; ET esotropia; PEK punctate epithelial keratitis; PEE punctate epithelial erosions; DES dry eye syndrome; MGD meibomian gland dysfunction; ATs artificial tears; PFAT's preservative free artificial tears; [place] nuclear sclerotic cataract; PSC posterior subcapsular cataract; ERM epi-retinal membrane; PVD posterior vitreous detachment; RD retinal detachment; DM diabetes mellitus; DR diabetic retinopathy; NPDR non-proliferative diabetic retinopathy; PDR proliferative diabetic retinopathy; CSME clinically significant macular edema; DME diabetic macular edema; dbh dot blot hemorrhages; CWS cotton wool spot; POAG primary open angle glaucoma; C/D cup-to-disc ratio; HVF humphrey visual field; GVF goldmann visual field; OCT optical coherence tomography; IOP intraocular pressure; BRVO Branch retinal vein occlusion; CRVO central retinal vein occlusion; CRAO central retinal artery occlusion; BRAO branch retinal artery occlusion; RT retinal tear; SB scleral buckle; PPV pars plana vitrectomy; VH Vitreous hemorrhage; PRP panretinal laser photocoagulation; IVK intravitreal kenalog; VMT vitreomacular traction; MH Macular hole;  NVD neovascularization of the disc; NVE neovascularization elsewhere; AREDS age related eye disease study; ARMD age related macular degeneration; POAG primary open angle glaucoma; EBMD epithelial/anterior basement membrane dystrophy; ACIOL anterior chamber intraocular lens; IOL intraocular lens; PCIOL posterior chamber intraocular lens; Phaco/IOL phacoemulsification with intraocular lens placement; [place] photorefractive keratectomy; LASIK laser assisted in situ keratomileusis; HTN hypertension; DM diabetes mellitus; COPD chronic obstructive pulmonary disease

## 2020-12-31 NOTE — Assessment & Plan Note (Signed)
Documented on prior examination with localized by fluorescein angiography performed 2-16 2022, and now with ongoing spontaneous resolution as we would expect

## 2020-12-31 NOTE — Assessment & Plan Note (Signed)
Old BRVO OD, not active and no CME active

## 2021-01-31 DIAGNOSIS — G4733 Obstructive sleep apnea (adult) (pediatric): Secondary | ICD-10-CM | POA: Diagnosis not present

## 2021-02-12 ENCOUNTER — Ambulatory Visit: Payer: Self-pay | Admitting: Family Medicine

## 2021-03-02 DIAGNOSIS — G4733 Obstructive sleep apnea (adult) (pediatric): Secondary | ICD-10-CM | POA: Diagnosis not present

## 2021-03-10 ENCOUNTER — Other Ambulatory Visit: Payer: Self-pay | Admitting: *Deleted

## 2021-03-10 DIAGNOSIS — I712 Thoracic aortic aneurysm, without rupture, unspecified: Secondary | ICD-10-CM

## 2021-03-11 ENCOUNTER — Telehealth: Payer: Self-pay | Admitting: Cardiology

## 2021-03-11 NOTE — Telephone Encounter (Signed)
Spoke with patient regarding the Tuesday 03/31/21 at 9:20 am---arrival time is 9:00 am--[facility] [address]---Liquids only 4 ours prior---patient to come in 1 week prior for lab work--will mail information to patient and he voiced his understanding.

## 2021-03-20 ENCOUNTER — Other Ambulatory Visit: Payer: Self-pay | Admitting: Family Medicine

## 2021-03-20 DIAGNOSIS — I482 Chronic atrial fibrillation, unspecified: Secondary | ICD-10-CM

## 2021-03-23 DIAGNOSIS — I712 Thoracic aortic aneurysm, without rupture: Secondary | ICD-10-CM | POA: Diagnosis not present

## 2021-03-23 LAB — BASIC METABOLIC PANEL
BUN/Creatinine Ratio: 17 (ref 10–24)
BUN: 26 mg/dL (ref 8–27)
CO2: 22 mmol/L (ref 20–29)
Calcium: 9.3 mg/dL (ref 8.6–10.2)
Chloride: 104 mmol/L (ref 96–106)
Creatinine, Ser: 1.51 mg/dL — ABNORMAL HIGH (ref 0.76–1.27)
Glucose: 120 mg/dL — ABNORMAL HIGH (ref 65–99)
Potassium: 4 mmol/L (ref 3.5–5.2)
Sodium: 142 mmol/L (ref 134–144)
eGFR: 48 mL/min/{1.73_m2} — ABNORMAL LOW (ref >59)

## 2021-03-24 ENCOUNTER — Encounter: Payer: Self-pay | Admitting: *Deleted

## 2021-03-31 ENCOUNTER — Encounter: Payer: Self-pay | Admitting: *Deleted

## 2021-03-31 ENCOUNTER — Telehealth: Payer: Self-pay | Admitting: *Deleted

## 2021-03-31 ENCOUNTER — Other Ambulatory Visit: Payer: Self-pay

## 2021-03-31 ENCOUNTER — Ambulatory Visit
Admission: RE | Admit: 2021-03-31 | Discharge: 2021-03-31 | Disposition: A | Discharge status: Home / Self Care | Payer: PPO | Source: Ambulatory Visit | Attending: Cardiology | Admitting: Cardiology

## 2021-03-31 DIAGNOSIS — I712 Thoracic aortic aneurysm, without rupture, unspecified: Secondary | ICD-10-CM

## 2021-03-31 DIAGNOSIS — J90 Pleural effusion, not elsewhere classified: Secondary | ICD-10-CM | POA: Diagnosis not present

## 2021-03-31 DIAGNOSIS — I2510 Atherosclerotic heart disease of native coronary artery without angina pectoris: Secondary | ICD-10-CM | POA: Diagnosis not present

## 2021-03-31 DIAGNOSIS — J8489 Other specified interstitial pulmonary diseases: Secondary | ICD-10-CM | POA: Diagnosis not present

## 2021-03-31 MED ORDER — IOPAMIDOL (ISOVUE-370) INJECTION 76%
75.0000 mL | Freq: Once | INTRAVENOUS | Status: AC | PRN
  Administered 2021-03-31: 75 mL via INTRAVENOUS

## 2021-03-31 NOTE — Telephone Encounter (Addendum)
-----   Message from Lelon Perla, MD sent at 03/31/2021 10:33 AM EDT ----- Please see if pt can fu with primary care for enlarging lymph nodes (and verify it has been done); may need biopsy; will also need fu CTA one year [facility]  Left message for pt to call

## 2021-03-31 NOTE — Telephone Encounter (Signed)
This encounter was created in error - please disregard.

## 2021-03-31 NOTE — Telephone Encounter (Signed)
Spoke with pt, aware of CT results and need to follow up with his medical doctor. Patient voiced understanding we will repeat CT scan in one year.

## 2021-04-02 ENCOUNTER — Encounter (INDEPENDENT_AMBULATORY_CARE_PROVIDER_SITE_OTHER): Payer: Self-pay | Admitting: Ophthalmology

## 2021-04-02 ENCOUNTER — Ambulatory Visit (INDEPENDENT_AMBULATORY_CARE_PROVIDER_SITE_OTHER): Payer: PPO | Admitting: Ophthalmology

## 2021-04-02 ENCOUNTER — Other Ambulatory Visit: Payer: Self-pay

## 2021-04-02 DIAGNOSIS — H348312 Tributary (branch) retinal vein occlusion, right eye, stable: Secondary | ICD-10-CM

## 2021-04-02 DIAGNOSIS — H35011 Changes in retinal vascular appearance, right eye: Secondary | ICD-10-CM | POA: Diagnosis not present

## 2021-04-02 DIAGNOSIS — H353132 Nonexudative age-related macular degeneration, bilateral, intermediate dry stage: Secondary | ICD-10-CM | POA: Diagnosis not present

## 2021-04-02 DIAGNOSIS — G4733 Obstructive sleep apnea (adult) (pediatric): Secondary | ICD-10-CM | POA: Diagnosis not present

## 2021-04-02 NOTE — Progress Notes (Signed)
04/02/2021     CHIEF COMPLAINT Patient presents for Retina Follow Up (3 Mo F/U OD//Pt c/o decreased near [place] OD x 3 mo. Pt sts, "I'm having problems seeing small things." No other symptoms reported OU.)   HISTORY OF PRESENT ILLNESS: Lee Kim is a 75 y.o. male who presents to the clinic today for:   HPI     Retina Follow Up           Diagnosis: CRVO/BRVO   Laterality: right eye   Onset: 3 months ago   Severity: mild   Duration: 3 months   Course: gradually worsening   Comments: 3 Mo F/U OD  Pt c/o decreased near [place] OD x 3 mo. Pt sts, "I'm having problems seeing small things." No other symptoms reported OU.       Last edited by Lee Kim, [place] on 04/02/2021  9:11 AM.      Referring physician: [name], MD [name],  [place] [id number]  HISTORICAL INFORMATION:   Selected notes from the MEDICAL RECORD NUMBER    Lab Results  Component Value Date   HGBA1C 6.0 (A) 06/11/2020     CURRENT MEDICATIONS: Current Outpatient Medications (Ophthalmic Drugs)  Medication Sig   TRAVATAN Z 0.004 % SOLN ophthalmic solution Place 1 drop into the right eye at bedtime.    No current facility-administered medications for this visit. (Ophthalmic Drugs)   Current Outpatient Medications (Other)  Medication Sig   amLODipine (NORVASC) 10 MG tablet Take 1 tablet (10 mg total) by mouth daily.   cromolyn (NASALCROM) 5.2 MG/ACT nasal spray Place 1 spray into both nostrils 2 (two) times daily.    ELIQUIS 5 MG TABS tablet TAKE 1 TABLET(5 MG) BY MOUTH TWICE DAILY   lisinopril-hydrochlorothiazide (ZESTORETIC) 10-12.5 MG tablet TAKE 1 TABLET BY MOUTH DAILY   Multiple Vitamin (MULTIVITAMIN) tablet Take 1 tablet by mouth daily.   Multiple Vitamins-Minerals (PRESERVISION AREDS 2 PO) Take 1 tablet by mouth 2 (two) times daily.    rosuvastatin (CRESTOR) 40 MG tablet Take 1 tablet (40 mg total) by mouth daily.   tadalafil (CIALIS) 20 MG tablet TAKE 1 TABLET BY  MOUTH EVERY DAY AS NEEDED   triamcinolone (KENALOG) 0.1 % APPLY EXTERNALLY TO THE AFFECTED AREA TWICE DAILY   No current facility-administered medications for this visit. (Other)      REVIEW OF SYSTEMS:    ALLERGIES Allergies  Allergen Reactions   Benzalkonium Chloride     Causes Eye issues Type of preservative     PAST MEDICAL HISTORY Past Medical History:  Diagnosis Date   Colonic polyp    Diverticulitis    Erythrocytosis    Hypertension    Obesity    OSA (obstructive sleep apnea)    Past Surgical History:  Procedure Laterality Date   COLONOSCOPY  03/2003   Dr. Wynetta Emery   COLONOSCOPY WITH PROPOFOL N/A 11/23/2016   Procedure: COLONOSCOPY WITH PROPOFOL;  Surgeon: Garlan Fair, MD;  Location: [facility];  Service: Endoscopy;  Laterality: N/A;   EYE SURGERY      FAMILY HISTORY Family History  Problem Relation Age of Onset   Hypertension Mother    Hypertension Father    Hypertension Sister     SOCIAL HISTORY Social History   Tobacco Use   Smoking status: Every Day    Pack years: 0.00    Types: Cigarettes   Smokeless tobacco: Never  Vaping Use   Vaping Use: Never used  Substance Use  Topics   Alcohol use: Yes    Alcohol/week: 7.0 standard drinks    Types: 7 Glasses of wine per week    Comment: 3-4 glasses wine per night   Drug use: No         OPHTHALMIC EXAM:  Base Eye Exam     Visual Acuity (ETDRS)       Right Left   Dist cc 20/30 -2 20/40 +2   Dist ph cc NI 20/25 -1    Correction: Glasses         Tonometry (Tonopen, 9:16 AM)       Right Left   Pressure 09 10         Pupils       Dark Light Shape React APD   Right 3 2 Round Brisk None   Left 4 3 Round Brisk None         Visual Fields (Counting fingers)       Left Right    Full Full         Extraocular Movement       Right Left    Full Full         Neuro/Psych     Oriented x3: Yes   Mood/Affect: Normal         Dilation     Right eye: 1.0%  Mydriacyl, 2.5% Phenylephrine @ 9:16 AM           Slit Lamp and Fundus Exam     External Exam       Right Left   External Normal Normal         Slit Lamp Exam       Right Left   Lids/Lashes Normal Normal   Conjunctiva/Sclera White and quiet White and quiet   Cornea Clear Clear   Anterior Chamber Deep and quiet Deep and quiet   Iris Round and reactive Round and reactive   Lens Posterior chamber intraocular lens, Centered posterior chamber intraocular lens Clear   Anterior Vitreous Normal Normal         Fundus Exam       Right Left   Posterior Vitreous Avitric    Disc 1+ Pallor    C/D Ratio 0.3    Macula Hard drusen, no thickening through the fovea, some diffuse thickening inferior to the fovea    Vessels Old retinal vein occlusion superotemporal to the nerve with collateralization of the vessels.  No signs of neovascularization    Periphery Large chorioretinal scar temporally, and inferiorly, good retinopexy, retina attached             IMAGING AND PROCEDURES  Imaging and Procedures for 04/02/21  OCT, Retina - OU - Both Eyes       Right Eye Quality was good. Scan locations included subfoveal. Central Foveal Thickness: 308. Progression has improved. Findings include abnormal foveal contour, cystoid macular edema.   Left Eye Quality was good. Scan locations included subfoveal. Central Foveal Thickness: 312. Progression has been stable. Findings include normal foveal contour.   Notes Much less intraretinal fluid and CME, none center involved threatening OD, spontaneous resolution ongoing particularly as compared to February 2022.  We will continue to monitor and observe             ASSESSMENT/PLAN:  Branch retinal vein occlusion, right eye Spontaneous collateralization of retinal vein occlusion and CME resolution OD inferior to the macula.  We will continue to observe  Intermediate stage nonexudative age-related macular degeneration of  both  eyes Minor  Retinal macroaneurysm of right eye Likely cause of CME inferior to the fovea now resolving     ICD-10-CM   1. 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 Intermediate stage nonexudative age-related macular degeneration of both eyes  H35.3132     3. Retinal macroaneurysm of right eye  H35.011       1.  History of BRVO OD, CME, and stabilize  2.  CME inferior to the fovea likely from local retinal artery macro aneurysm which is undergoing spontaneous resolution and resolution of adjacent CME.  We will continue to observe  3.  Ophthalmic Meds Ordered this visit:  No orders of the defined types were placed in this encounter.      Return in about 6 months (around 10/02/2021) for DILATE OU, OCT.  There are no Patient Instructions on file for this visit.   Explained the diagnoses, plan, and follow up with the patient and they expressed understanding.  Patient expressed understanding of the importance of proper follow up care.   Clent Demark Amenda Duclos M.D. Diseases & Surgery of the Retina and Vitreous Retina & Diabetic [place] 04/02/21     Abbreviations: M myopia (nearsighted); A astigmatism; H hyperopia (farsighted); P presbyopia; Mrx spectacle prescription;  CTL contact lenses; OD right eye; OS left eye; OU both eyes  XT exotropia; ET esotropia; PEK punctate epithelial keratitis; PEE punctate epithelial erosions; DES dry eye syndrome; MGD meibomian gland dysfunction; ATs artificial tears; PFAT's preservative free artificial tears; [place] nuclear sclerotic cataract; PSC posterior subcapsular cataract; ERM epi-retinal membrane; PVD posterior vitreous detachment; RD retinal detachment; DM diabetes mellitus; DR diabetic retinopathy; NPDR non-proliferative diabetic retinopathy; PDR proliferative diabetic retinopathy; CSME clinically significant macular edema; DME diabetic macular edema; dbh dot blot hemorrhages; CWS cotton wool  spot; POAG primary open angle glaucoma; C/D cup-to-disc ratio; HVF humphrey visual field; GVF goldmann visual field; OCT optical coherence tomography; IOP intraocular pressure; BRVO Branch retinal vein occlusion; CRVO central retinal vein occlusion; CRAO central retinal artery occlusion; BRAO branch retinal artery occlusion; RT retinal tear; SB scleral buckle; PPV pars plana vitrectomy; VH Vitreous hemorrhage; PRP panretinal laser photocoagulation; IVK intravitreal kenalog; VMT vitreomacular traction; MH Macular hole;  NVD neovascularization of the disc; NVE neovascularization elsewhere; AREDS age related eye disease study; ARMD age related macular degeneration; POAG primary open angle glaucoma; EBMD epithelial/anterior basement membrane dystrophy; ACIOL anterior chamber intraocular lens; IOL intraocular lens; PCIOL posterior chamber intraocular lens; Phaco/IOL phacoemulsification with intraocular lens placement; [place] photorefractive keratectomy; LASIK laser assisted in situ keratomileusis; HTN hypertension; DM diabetes mellitus; COPD chronic obstructive pulmonary disease

## 2021-04-02 NOTE — Assessment & Plan Note (Signed)
Spontaneous collateralization of retinal vein occlusion and CME resolution OD inferior to the macula.  We will continue to observe

## 2021-04-02 NOTE — Assessment & Plan Note (Signed)
Minor

## 2021-04-02 NOTE — Assessment & Plan Note (Signed)
Likely cause of CME inferior to the fovea now resolving

## 2021-04-03 ENCOUNTER — Telehealth: Payer: Self-pay

## 2021-04-03 NOTE — Telephone Encounter (Signed)
Debra from Dr. Jacalyn Lefevre office called & wanted to make sure you saw pt's CT scan & scheduled follow up appt with pt

## 2021-04-03 NOTE — Telephone Encounter (Signed)
Left message for pt

## 2021-04-07 NOTE — Telephone Encounter (Signed)
Left another message.

## 2021-04-08 ENCOUNTER — Ambulatory Visit (INDEPENDENT_AMBULATORY_CARE_PROVIDER_SITE_OTHER): Payer: PPO | Admitting: Family Medicine

## 2021-04-08 ENCOUNTER — Other Ambulatory Visit: Payer: Self-pay

## 2021-04-08 VITALS — BP 102/68 | HR 86 | Temp 96.6°F | Wt 259.8 lb

## 2021-04-08 DIAGNOSIS — I700 Atherosclerosis of aorta: Secondary | ICD-10-CM

## 2021-04-08 DIAGNOSIS — I7121 Aneurysm of the ascending aorta, without rupture: Secondary | ICD-10-CM

## 2021-04-08 DIAGNOSIS — R590 Localized enlarged lymph nodes: Secondary | ICD-10-CM | POA: Diagnosis not present

## 2021-04-08 DIAGNOSIS — I712 Thoracic aortic aneurysm, without rupture: Secondary | ICD-10-CM

## 2021-04-08 DIAGNOSIS — I2510 Atherosclerotic heart disease of native coronary artery without angina pectoris: Secondary | ICD-10-CM | POA: Diagnosis not present

## 2021-04-08 NOTE — Progress Notes (Signed)
   Subjective:    Patient ID: Lee Kim, male    DOB: 1946-04-25, 75 y.o.   MRN: [id number]  HPI He is here for consult concerning a recent CT angiogram of the aorta.  This was a follow-up on a previous CT scan 3 months prior to this.  He does have a history of ascending aortic aneurysm which seems to be stable however the most recent angiogram also showed evidence of aortic atherosclerosis as well as coronary atherosclerosis and mediastinal adenopathy. Presently he has no chest pain, shortness of breath, weight loss, fever or chills.  Review of Systems     Objective:   Physical Exam Alert and in no distress otherwise not examined       Assessment & Plan:  Mediastinal adenopathy - Plan: CBC with Differential/Platelet, Comprehensive metabolic panel, CT Abdomen Pelvis W Contrast, Ambulatory referral to Hematology / Oncology  Atherosclerosis of aorta [facility])  Ascending aortic aneurysm ([name])  Atherosclerosis of coronary artery of native heart without angina pectoris, unspecified vessel or lesion type I discussed the results of the CT scan with him in detail discussing the atherosclerosis that he has as well as the aneurysm and the mediastinal adenopathy.  Discussed further evaluation to see the extent of the involvement as well as potentially getting a biopsy.  I explained that we will get further CT scans, blood work and refer to oncology.  We will also need to get a biopsy to make the definitive diagnosis.  All of his questions were answered.

## 2021-04-09 ENCOUNTER — Telehealth: Payer: Self-pay

## 2021-04-09 LAB — COMPREHENSIVE METABOLIC PANEL
ALT: 46 IU/L — ABNORMAL HIGH (ref 0–44)
AST: 31 IU/L (ref 0–40)
Albumin/Globulin Ratio: 1.3 (ref 1.2–2.2)
Albumin: 3.9 g/dL (ref 3.7–4.7)
Alkaline Phosphatase: 214 IU/L — ABNORMAL HIGH (ref 44–121)
BUN/Creatinine Ratio: 21 (ref 10–24)
BUN: 27 mg/dL (ref 8–27)
Bilirubin Total: 0.7 mg/dL (ref 0.0–1.2)
CO2: 22 mmol/L (ref 20–29)
Calcium: 9.3 mg/dL (ref 8.6–10.2)
Chloride: 106 mmol/L (ref 96–106)
Creatinine, Ser: 1.26 mg/dL (ref 0.76–1.27)
Globulin, Total: 3 g/dL (ref 1.5–4.5)
Glucose: 124 mg/dL — ABNORMAL HIGH (ref 65–99)
Potassium: 4.2 mmol/L (ref 3.5–5.2)
Sodium: 142 mmol/L (ref 134–144)
Total Protein: 6.9 g/dL (ref 6.0–8.5)
eGFR: 60 mL/min/{1.73_m2} (ref >59)

## 2021-04-09 LAB — CBC WITH DIFFERENTIAL/PLATELET
Basophils Absolute: 0.1 10*3/uL (ref 0.0–0.2)
Basos: 1 %
EOS (ABSOLUTE): 0.2 10*3/uL (ref 0.0–0.4)
Eos: 3 %
Hematocrit: 39.7 % (ref 37.5–51.0)
Hemoglobin: 13.4 g/dL (ref 13.0–17.7)
Immature Grans (Abs): 0.1 10*3/uL (ref 0.0–0.1)
Immature Granulocytes: 1 %
Lymphocytes Absolute: 0.9 10*3/uL (ref 0.7–3.1)
Lymphs: 11 %
MCH: 32 pg (ref 26.6–33.0)
MCHC: 33.8 g/dL (ref 31.5–35.7)
MCV: 95 fL (ref 79–97)
Monocytes Absolute: 0.8 10*3/uL (ref 0.1–0.9)
Monocytes: 10 %
Neutrophils Absolute: 6.1 10*3/uL (ref 1.4–7.0)
Neutrophils: 74 %
Platelets: 217 10*3/uL (ref 150–450)
RBC: 4.19 x10E6/uL (ref 4.14–5.80)
RDW: 13.2 % (ref 11.6–15.4)
WBC: 8.2 10*3/uL (ref 3.4–10.8)

## 2021-04-09 NOTE — Telephone Encounter (Signed)
LVM for pt to call back for labs [facility]

## 2021-04-14 ENCOUNTER — Other Ambulatory Visit: Payer: Self-pay

## 2021-04-14 ENCOUNTER — Telehealth: Payer: Self-pay

## 2021-04-14 NOTE — Telephone Encounter (Signed)
Pt called to find out what was going on his referral to oncology. Pt was advised that his reerral was sent back to [place] due to further testing needed. Pt was also advised that he would hear from our office as soon as we hear from [facility] concerning next steps on obtaining a biopsy. [place]

## 2021-04-16 ENCOUNTER — Other Ambulatory Visit: Payer: Self-pay | Admitting: *Deleted

## 2021-04-16 NOTE — Progress Notes (Signed)
The proposed treatment discussed in cancer conference is for discussion purpose only and is not a binding recommendation.  The patient was not physically examined nor present for their treatment options. Therefore, final treatment plans cannot be decided.   Patient is referred to oncology. I notified new patient coordinator to schedule with Dr. Lorenso Courier or Dr. Irene Limbo.

## 2021-04-20 ENCOUNTER — Telehealth: Payer: Self-pay

## 2021-04-20 ENCOUNTER — Other Ambulatory Visit: Payer: Self-pay | Admitting: Family Medicine

## 2021-04-20 DIAGNOSIS — R590 Localized enlarged lymph nodes: Secondary | ICD-10-CM

## 2021-04-20 NOTE — Telephone Encounter (Signed)
Called pt to advise Dr. Redmond School has scheduled the biopsy and be on the look out for a call to schedule . [place]

## 2021-04-22 ENCOUNTER — Encounter (HOSPITAL_COMMUNITY): Payer: Self-pay

## 2021-04-22 NOTE — Progress Notes (Unsigned)
       Patient Demographics  Patient Name  Lee Kim, Lee Kim Legal Sex  Male DOB  09/21/1946 SSN  [id number] Address  [place]  [place] [address] Phone  [phone] [facility])  [phone] (Mobile)     RE: Biopsy Received: Isabell Jarvis, MD  [name] Ir Procedure Requests Approved for US guided core biopsy of LEFT supraclavicular LN.  Possible CLL or other lymphoma.   HKM         Previous Messages    ----- Message -----  From: Lenore Cordia  Sent: 04/21/2021  11:29 AM EDT  To: Ir Procedure Requests  Subject: Biopsy                                         Procedure Requested: US Biopsy (lymph node)    Reason for Procedure: lympphadenopathy    Provider Requesting: Dr Redmond School  Provider [phone]    Other Info:

## 2021-04-24 ENCOUNTER — Encounter (HOSPITAL_COMMUNITY): Payer: Self-pay

## 2021-04-24 NOTE — Progress Notes (Unsigned)
       Patient Demographics  Patient Name  Lee Kim, Lee Kim Legal Sex  Male DOB  [birth year] SSN  [id number] Address  [address]  [place] [address] Phone  [phone] [facility])  [phone] (Mobile)     RE: Biopsy Received: 3 days ago [name], MD  [name] Ir Procedure Requests Approved for US guided core biopsy of LEFT supraclavicular LN.  Possible CLL or other lymphoma.   HKM         Previous Messages    ----- Message -----  From: Lenore Cordia  Sent: 04/21/2021  11:29 AM EDT  To: Ir Procedure Requests  Subject: Biopsy                                         Procedure Requested: US Biopsy (lymph node)    Reason for Procedure: lympphadenopathy    Provider Requesting: Dr Redmond School  Provider Telephone:[phone]    Other Info:

## 2021-04-27 ENCOUNTER — Encounter (HOSPITAL_COMMUNITY): Payer: Self-pay

## 2021-04-27 ENCOUNTER — Telehealth: Payer: Self-pay

## 2021-04-27 ENCOUNTER — Telehealth: Payer: Self-pay | Admitting: Cardiology

## 2021-04-27 NOTE — Telephone Encounter (Signed)
Patient is going in for a biopsy and they want to keep patient off of Eliquis for 3 days. Patient wants to ask Dr. Stanford Breed if that is ok. Please call back

## 2021-04-27 NOTE — Telephone Encounter (Signed)
RN researched.   Eliquis is being prescribed by patient's primary.    Radiology department scheduler contacted primary for the authorization to hold Eliquis for biopsy.  The authorization was given to hold medication.  RN contacted patient back -  RN explained the process to patient.  RN informed patient radiology will let him know when he can restart Eliquis.  Patient verbalized understanding.

## 2021-04-27 NOTE — Telephone Encounter (Signed)
A user error has taken place: encounter opened in error, closed for administrative reasons.

## 2021-04-27 NOTE — Progress Notes (Signed)
       Patient Demographics  Patient Name  Lee Kim, Lee Kim Legal Sex  Male DOB  1946/09/26 SSN  [id number] Address  [address]  [place] [address] Phone  [phone] [facility])  [phone] (Mobile)     RE: Biopsy/Blood thinner Received: 3 days ago [name], MD  [name] to hold his Eliquis         Previous Messages    ----- Message -----  From: Lenore Cordia  Sent: 04/24/2021   1:53 PM EDT  To: [name], MD  Subject: Biopsy/Blood thinner                           Hello Dr Redmond School,   Patient [name] has been scheduled for a biopsy on July 29th.  He is on the blood thinner Eliquis.  He will need to hold this med  Two days prior to the biopsy. Permission is needed.   Soriah Leeman   ----- Message -----  From: [name], MD  Sent: 04/21/2021  12:42 PM EDT  To: Lenore Cordia, Ir Procedure Requests  Subject: RE: Biopsy                                     Approved for US guided core biopsy of LEFT supraclavicular LN.  Possible CLL or other lymphoma.   HKM    ----- Message -----  From: Lenore Cordia  Sent: 04/21/2021  11:29 AM EDT  To: Ir Procedure Requests  Subject: Biopsy                                         Procedure Requested: US Biopsy (lymph node)    Reason for Procedure: lympphadenopathy    Provider Requesting: Dr Redmond School  Provider Telephone:[phone]    Other Info:

## 2021-04-27 NOTE — Telephone Encounter (Signed)
Patient was concerned  because the  scheduler for biopsy called him to inform of   holding medication Eliquis for 3 days prior to  biospy.  RN  asked patient does he have a schedule time for biopsy.  Patient sates the biopsy is schedule for 05/01/21. Usually ,radiology will get confirmation from cardiology  for clearance .  RN did not see that information. RN informed se would to research further and call him back. Patient voiced understanding.

## 2021-04-29 DIAGNOSIS — H401131 Primary open-angle glaucoma, bilateral, mild stage: Secondary | ICD-10-CM | POA: Diagnosis not present

## 2021-04-29 DIAGNOSIS — H53462 Homonymous bilateral field defects, left side: Secondary | ICD-10-CM | POA: Diagnosis not present

## 2021-04-29 DIAGNOSIS — H2512 Age-related nuclear cataract, left eye: Secondary | ICD-10-CM | POA: Diagnosis not present

## 2021-04-29 DIAGNOSIS — H35351 Cystoid macular degeneration, right eye: Secondary | ICD-10-CM | POA: Diagnosis not present

## 2021-04-29 DIAGNOSIS — H353131 Nonexudative age-related macular degeneration, bilateral, early dry stage: Secondary | ICD-10-CM | POA: Diagnosis not present

## 2021-04-30 ENCOUNTER — Other Ambulatory Visit: Payer: Self-pay | Admitting: Radiology

## 2021-04-30 ENCOUNTER — Telehealth: Payer: Self-pay | Admitting: Hematology and Oncology

## 2021-04-30 ENCOUNTER — Other Ambulatory Visit: Payer: Self-pay | Admitting: Family Medicine

## 2021-04-30 DIAGNOSIS — H5340 Unspecified visual field defects: Secondary | ICD-10-CM

## 2021-04-30 DIAGNOSIS — H53469 Homonymous bilateral field defects, unspecified side: Secondary | ICD-10-CM | POA: Insufficient documentation

## 2021-04-30 NOTE — Telephone Encounter (Signed)
Received a new pt referral from Dr. Redmond School for a pancreatic mass. Lee Kim has been cld and scheduled to see Dr. Lorenso Courier on 8/4 at [facility]. Pt aware to arrive 15 minutes early.

## 2021-04-30 NOTE — Progress Notes (Signed)
He was recently seen by Dr. Elliot Dally and found to have bilateral field defects possibly indicating a stroke but could also possibly indicate metastatic disease.  He is scheduled for a biopsy tomorrow and is already scheduled with oncology and for a CT of his chest.  I discussed all of this with the patient so he is aware of getting the MRI.

## 2021-05-01 ENCOUNTER — Encounter (HOSPITAL_COMMUNITY): Payer: Self-pay

## 2021-05-01 ENCOUNTER — Other Ambulatory Visit: Payer: Self-pay

## 2021-05-01 ENCOUNTER — Ambulatory Visit (HOSPITAL_COMMUNITY)
Admission: RE | Admit: 2021-05-01 | Discharge: 2021-05-01 | Disposition: A | Discharge status: Home / Self Care | Payer: PPO | Source: Ambulatory Visit | Attending: Family Medicine | Admitting: Family Medicine

## 2021-05-01 DIAGNOSIS — Z79899 Other long term (current) drug therapy: Secondary | ICD-10-CM | POA: Diagnosis not present

## 2021-05-01 DIAGNOSIS — C770 Secondary and unspecified malignant neoplasm of lymph nodes of head, face and neck: Secondary | ICD-10-CM | POA: Diagnosis not present

## 2021-05-01 DIAGNOSIS — F17210 Nicotine dependence, cigarettes, uncomplicated: Secondary | ICD-10-CM | POA: Insufficient documentation

## 2021-05-01 DIAGNOSIS — C801 Malignant (primary) neoplasm, unspecified: Secondary | ICD-10-CM | POA: Diagnosis not present

## 2021-05-01 DIAGNOSIS — R590 Localized enlarged lymph nodes: Secondary | ICD-10-CM | POA: Diagnosis not present

## 2021-05-01 DIAGNOSIS — Z7901 Long term (current) use of anticoagulants: Secondary | ICD-10-CM | POA: Diagnosis not present

## 2021-05-01 MED ORDER — LIDOCAINE HCL (PF) 1 % IJ SOLN
INTRAMUSCULAR | Status: AC
  Filled 2021-05-01: qty 30

## 2021-05-01 MED ORDER — FENTANYL CITRATE (PF) 100 MCG/2ML IJ SOLN
INTRAMUSCULAR | Status: AC | PRN
  Administered 2021-05-01: 25 ug via INTRAVENOUS

## 2021-05-01 MED ORDER — SODIUM CHLORIDE 0.9 % IV SOLN: INTRAVENOUS | Status: DC

## 2021-05-01 MED ORDER — MIDAZOLAM HCL 2 MG/2ML IJ SOLN
INTRAMUSCULAR | Status: AC | PRN
  Administered 2021-05-01 (×2): 1 mg via INTRAVENOUS

## 2021-05-01 MED ORDER — MIDAZOLAM HCL 2 MG/2ML IJ SOLN
INTRAMUSCULAR | Status: AC
  Filled 2021-05-01: qty 2

## 2021-05-01 MED ORDER — FENTANYL CITRATE (PF) 100 MCG/2ML IJ SOLN
INTRAMUSCULAR | Status: AC
  Filled 2021-05-01: qty 2

## 2021-05-01 NOTE — Procedures (Signed)
Interventional Radiology Procedure Note  Procedure: US guided biopsy  Indication: Left neck lymphadenopathy  Findings: Please refer to procedural dictation for full description.  Complications: None  EBL: < 10 mL  Miachel Roux, MD [phone]

## 2021-05-01 NOTE — H&P (Signed)
Chief Complaint: Patient was seen in consultation today for image guided biopsy of left supraclavicular lymphadenopathy at the request of [place] [facility]  Referring Physician(s): [name]  Supervising Physician: Mir, Sharen Heck  Patient Status: [facility] - Out-pt  History of Present Illness: Lee Kim is a 75 y.o. male with past medical history of HTN, OSA, diverticulitis, erythrocytosis, colon polyps, goiter s/p FNA of thyroid nodules in April 2022, atrial fibrillation on Eliquis, and thoracic aortic aneurysm who underwent follow-up CT on 03/31/2021 which showed incidental finding of left supraclavicular lymph node measured 1.5 cm. Dr. Redmond School PCP discussed the CT findings with patient during office visit on 04/08/2021.  A oncology referral and biopsy of the left supraclavicle lymph node was recommended to the patient for further evaluation, after thorough discussion and shared decision making, patient decided to proceed with the biopsy.  IR was requested for image guided biopsy of the left supraclavicular lymphadenopathy.  Patient laying in bed, not in acute distress.  Denise headache, fever, chills, shortness of breath, cough, chest pain, abdominal pain, nausea ,vomiting, and bleeding.  Patient asks how long the recovery time is. Informed the patient that typical recovery time for the supraclavicular lymphadenopathy biopsy is around 1 hour after the procedure.  Patient verbalized he is okay with 1 hour recovery time, patient had to be numbed couple times for the thyroid FNA, he wishes to be sedated for the procedure.  Past Medical History:  Diagnosis Date   Colonic polyp    Diverticulitis    Erythrocytosis    Hypertension    Obesity    OSA (obstructive sleep apnea)     Past Surgical History:  Procedure Laterality Date   COLONOSCOPY  03/2003   Dr. Wynetta Emery   COLONOSCOPY WITH PROPOFOL N/A 11/23/2016   Procedure: COLONOSCOPY WITH PROPOFOL;  Surgeon: Garlan Fair, MD;   Location: [facility] ENDOSCOPY;  Service: Endoscopy;  Laterality: N/A;   EYE SURGERY      Allergies: Benzalkonium chloride  Medications: Prior to Admission medications   Medication Sig Start Date End Date Taking? Authorizing Provider  amLODipine (NORVASC) 10 MG tablet Take 1 tablet (10 mg total) by mouth daily. 06/11/20  Yes [name], MD  cromolyn (NASALCROM) 5.2 MG/ACT nasal spray Place 1 spray into both nostrils 2 (two) times daily as needed for allergies.   Yes [provider]  ELIQUIS 5 MG TABS tablet TAKE 1 TABLET(5 MG) BY MOUTH TWICE DAILY Patient taking differently: Take 5 mg by mouth 2 (two) times daily. 03/20/21  Yes [name], MD  lisinopril-hydrochlorothiazide (ZESTORETIC) 10-12.5 MG tablet TAKE 1 TABLET BY MOUTH DAILY 06/11/20  Yes [name], MD  Multiple Vitamins-Minerals (PRESERVISION AREDS 2 PO) Take 1 tablet by mouth 2 (two) times daily.    Yes [provider]  rosuvastatin (CRESTOR) 40 MG tablet Take 40 mg by mouth daily.   Yes [provider]  tadalafil (CIALIS) 20 MG tablet TAKE 1 TABLET BY MOUTH EVERY DAY AS NEEDED Patient taking differently: Take 10 mg by mouth daily as needed for erectile dysfunction. 06/11/20  Yes [name], MD  TRAVATAN Z 0.004 % SOLN ophthalmic solution Place 1 drop into the right eye at bedtime. [place] 07/08/16  Yes [provider]  triamcinolone (KENALOG) 0.1 % APPLY EXTERNALLY TO THE AFFECTED AREA TWICE DAILY Patient taking differently: Apply 1 application topically daily as needed (psoriasis). 10/14/20   [name], MD     Family History  Problem Relation Age of  Onset   Hypertension Mother    Hypertension Father    Hypertension Sister     Social History   Socioeconomic History   Marital status: Married    Spouse name: Not on file   Number of children: 1   Years of education: Not on file   Highest education level: Not on file  Occupational History   Not on file  Tobacco Use    Smoking status: Every Day    Types: Cigarettes   Smokeless tobacco: Never  Vaping Use   Vaping Use: Never used  Substance and Sexual Activity   Alcohol use: Yes    Alcohol/week: 7.0 standard drinks    Types: 7 Glasses of wine per week    Comment: 3-4 glasses wine per night   Drug use: No   Sexual activity: Yes  Other Topics Concern   Not on file  Social History Narrative   Not on file   Social Determinants of Health   Financial Resource Strain: Not on file  Food Insecurity: Not on file  Transportation Needs: Not on file  Physical Activity: Not on file  Stress: Not on file  Social Connections: Not on file     Review of Systems: A 12 point ROS discussed and pertinent positives are indicated in the HPI above.  All other systems are negative.   Vital Signs: BP 112/86   Pulse 78   Temp 97.8 F (36.6 C) (Oral)   Ht '6\' 1"'$  (1.854 m)   Wt 259 lb 11.2 oz (117.8 kg)   SpO2 99%   BMI 34.26 kg/m   Physical Exam Vitals reviewed.  Constitutional:      Appearance: Normal appearance.  HENT:     Head: Normocephalic and atraumatic.     Mouth/Throat:     Mouth: Mucous membranes are moist.  Cardiovascular:     Rate and Rhythm: Normal rate and regular rhythm.  Pulmonary:     Effort: Pulmonary effort is normal.     Breath sounds: Normal breath sounds.  Abdominal:     General: Abdomen is flat. Bowel sounds are normal.     Palpations: Abdomen is soft.  Musculoskeletal:     Cervical back: Neck supple.  Skin:    General: Skin is warm and dry.     Coloration: Skin is not jaundiced or pale.  Neurological:     Mental Status: He is alert and oriented to person, place, and time.  Psychiatric:        Mood and Affect: Mood normal.        Behavior: Behavior normal.        Judgment: Judgment normal.    MD Evaluation Airway: WNL Heart: WNL Abdomen: WNL Chest/ Lungs: WNL ASA  Classification: 2 Mallampati/Airway Score: Two  Imaging: OCT, Retina - OU - Both Eyes  Result  Date: 04/02/2021 Right Eye Quality was good. Scan locations included subfoveal. Central Foveal Thickness: 308. Progression has improved. Findings include abnormal foveal contour, cystoid macular edema. Left Eye Quality was good. Scan locations included subfoveal. Central Foveal Thickness: 312. Progression has been stable. Findings include normal foveal contour. Notes Much less intraretinal fluid and CME, none center involved threatening OD, spontaneous resolution ongoing particularly as compared to February 2022.  We will continue to monitor and observe   Labs:  CBC: Recent Labs    06/11/20 0922 04/08/21 0951  WBC 7.8 8.2  HGB 17.8* 13.4  HCT 49.8 39.7  PLT 192 217    COAGS: No results  for input(s): INR, APTT in the last 8760 hours.  BMP: Recent Labs    06/11/20 0922 03/23/21 0825 04/08/21 0951  NA 140 142 142  K 4.5 4.0 4.2  CL 100 104 106  CO2 '26 22 22  '$ GLUCOSE 139* 120* 124*  BUN '22 26 27  '$ CALCIUM 9.7 9.3 9.3  CREATININE 1.42* 1.51* 1.26  GFRNONAA 49*  --   --   GFRAA 57*  --   --     LIVER FUNCTION TESTS: Recent Labs    06/11/20 0922 04/08/21 0951  BILITOT 0.7 0.7  AST 27 31  ALT 40 46*  ALKPHOS 112 214*  PROT 6.8 6.9  ALBUMIN 4.2 3.9    TUMOR MARKERS: No results for input(s): AFPTM, CEA, CA199, CHROMGRNA in the last 8760 hours.  Assessment and Plan: 75 y.o. male with recent incidental finding of left supraclavicular lymphadenopathy. After thorough discussion and shared decision making with his PCP, patient decided to proceed with biopsy of the lymphadenopathy for further evaluation.  IR was requested for image guided left supraclavicular lymphadenopathy biopsy. Case was reviewed and approved for ultrasound-guided core biopsy of left supraclavicular lymphadenopathy by Dr. Laurence Ferrari. Patient presents to [facility] IR today for the procedure. N.p.o. since 6 AM today VSS  Patient states that he was instructed to stop taking his Eliquis for the biopsy by PCP,  asks when he can resume his Eliquis. Informed the patient that he can resume Eliquis starting tonight as supraclavicular lymph monitor passed a biopsy is a low bleeding risk procedure per IR protocol. Patient verbalized understanding.  Risks and benefits of supraclavicular lymphadenopathy biopsy was discussed with the patient and/or patient's family including, but not limited to bleeding, infection, damage to adjacent structures or low yield requiring additional tests.  All of the questions were answered and there is agreement to proceed.  Consent signed and in chart.  Thank you for this interesting consult.  I greatly enjoyed meeting Lee Kim and look forward to participating in their care.  A copy of this report was sent to the requesting provider on this date.  Electronically Signed: Tera Mater, PA-C 05/01/2021, 1:19 PM   I spent a total of  30 Minutes   in face to face in clinical consultation, greater than 50% of which was counseling/coordinating care for supraclavicular lymphadenopathy biopsy.

## 2021-05-02 DIAGNOSIS — G4733 Obstructive sleep apnea (adult) (pediatric): Secondary | ICD-10-CM | POA: Diagnosis not present

## 2021-05-06 ENCOUNTER — Other Ambulatory Visit: Payer: Self-pay

## 2021-05-06 ENCOUNTER — Ambulatory Visit
Admission: RE | Admit: 2021-05-06 | Discharge: 2021-05-06 | Disposition: A | Discharge status: Home / Self Care | Payer: PPO | Source: Ambulatory Visit | Attending: Family Medicine | Admitting: Family Medicine

## 2021-05-06 DIAGNOSIS — D7389 Other diseases of spleen: Secondary | ICD-10-CM | POA: Diagnosis not present

## 2021-05-06 DIAGNOSIS — K7689 Other specified diseases of liver: Secondary | ICD-10-CM | POA: Diagnosis not present

## 2021-05-06 DIAGNOSIS — D735 Infarction of spleen: Secondary | ICD-10-CM | POA: Diagnosis not present

## 2021-05-06 DIAGNOSIS — K828 Other specified diseases of gallbladder: Secondary | ICD-10-CM | POA: Diagnosis not present

## 2021-05-06 LAB — SURGICAL PATHOLOGY

## 2021-05-06 MED ORDER — IOPAMIDOL (ISOVUE-300) INJECTION 61%
100.0000 mL | Freq: Once | INTRAVENOUS | Status: AC | PRN
  Administered 2021-05-06: 100 mL via INTRAVENOUS

## 2021-05-07 ENCOUNTER — Inpatient Hospital Stay: Payer: PPO

## 2021-05-07 ENCOUNTER — Inpatient Hospital Stay: Payer: PPO | Attending: Hematology and Oncology | Admitting: Hematology and Oncology

## 2021-05-07 ENCOUNTER — Encounter: Payer: Self-pay | Admitting: Hematology and Oncology

## 2021-05-07 VITALS — BP 131/80 | HR 83 | Temp 97.1°F | Resp 18 | Wt 252.1 lb

## 2021-05-07 DIAGNOSIS — C259 Malignant neoplasm of pancreas, unspecified: Secondary | ICD-10-CM

## 2021-05-07 DIAGNOSIS — I10 Essential (primary) hypertension: Secondary | ICD-10-CM | POA: Insufficient documentation

## 2021-05-07 DIAGNOSIS — Z5111 Encounter for antineoplastic chemotherapy: Secondary | ICD-10-CM | POA: Insufficient documentation

## 2021-05-07 DIAGNOSIS — F17210 Nicotine dependence, cigarettes, uncomplicated: Secondary | ICD-10-CM | POA: Diagnosis not present

## 2021-05-07 DIAGNOSIS — R935 Abnormal findings on diagnostic imaging of other abdominal regions, including retroperitoneum: Secondary | ICD-10-CM | POA: Insufficient documentation

## 2021-05-07 DIAGNOSIS — C772 Secondary and unspecified malignant neoplasm of intra-abdominal lymph nodes: Secondary | ICD-10-CM

## 2021-05-07 DIAGNOSIS — G4733 Obstructive sleep apnea (adult) (pediatric): Secondary | ICD-10-CM | POA: Insufficient documentation

## 2021-05-07 DIAGNOSIS — E669 Obesity, unspecified: Secondary | ICD-10-CM | POA: Diagnosis not present

## 2021-05-07 LAB — CBC WITH DIFFERENTIAL (CANCER CENTER ONLY)
Abs Immature Granulocytes: 0.06 10*3/uL (ref 0.00–0.07)
Basophils Absolute: 0.1 10*3/uL (ref 0.0–0.1)
Basophils Relative: 1 %
Eosinophils Absolute: 0.2 10*3/uL (ref 0.0–0.5)
Eosinophils Relative: 2 %
HCT: 37.6 % — ABNORMAL LOW (ref 39.0–52.0)
Hemoglobin: 12.3 g/dL — ABNORMAL LOW (ref 13.0–17.0)
Immature Granulocytes: 1 %
Lymphocytes Relative: 13 %
Lymphs Abs: 1.1 10*3/uL (ref 0.7–4.0)
MCH: 31.7 pg (ref 26.0–34.0)
MCHC: 32.7 g/dL (ref 30.0–36.0)
MCV: 96.9 fL (ref 80.0–100.0)
Monocytes Absolute: 0.9 10*3/uL (ref 0.1–1.0)
Monocytes Relative: 11 %
Neutro Abs: 6.1 10*3/uL (ref 1.7–7.7)
Neutrophils Relative %: 72 %
Platelet Count: 157 10*3/uL (ref 150–400)
RBC: 3.88 MIL/uL — ABNORMAL LOW (ref 4.22–5.81)
RDW: 15.2 % (ref 11.5–15.5)
WBC Count: 8.4 10*3/uL (ref 4.0–10.5)
nRBC: 0 % (ref 0.0–0.2)

## 2021-05-07 LAB — CMP (CANCER CENTER ONLY)
ALT: 33 U/L (ref 0–44)
AST: 34 U/L (ref 15–41)
Albumin: 3.6 g/dL (ref 3.5–5.0)
Alkaline Phosphatase: 159 U/L — ABNORMAL HIGH (ref 38–126)
Anion gap: 9 (ref 5–15)
BUN: 34 mg/dL — ABNORMAL HIGH (ref 8–23)
CO2: 26 mmol/L (ref 22–32)
Calcium: 9.5 mg/dL (ref 8.9–10.3)
Chloride: 109 mmol/L (ref 98–111)
Creatinine: 1.7 mg/dL — ABNORMAL HIGH (ref 0.61–1.24)
GFR, Estimated: 42 mL/min — ABNORMAL LOW (ref >60)
Glucose, Bld: 120 mg/dL — ABNORMAL HIGH (ref 70–99)
Potassium: 4.5 mmol/L (ref 3.5–5.1)
Sodium: 144 mmol/L (ref 135–145)
Total Bilirubin: 0.8 mg/dL (ref 0.3–1.2)
Total Protein: 7 g/dL (ref 6.5–8.1)

## 2021-05-07 LAB — LACTATE DEHYDROGENASE: LDH: 342 U/L — ABNORMAL HIGH (ref 98–192)

## 2021-05-07 LAB — CEA (IN HOUSE-CHCC): CEA (CHCC-In House): 75.47 ng/mL — ABNORMAL HIGH (ref 0.00–5.00)

## 2021-05-07 MED ORDER — LIDOCAINE-PRILOCAINE 2.5-2.5 % EX CREA: 1.0000 "application " | TOPICAL_CREAM | CUTANEOUS | 0 refills | Status: AC | PRN

## 2021-05-07 MED ORDER — ONDANSETRON HCL 8 MG PO TABS
8.0000 mg | ORAL_TABLET | Freq: Three times a day (TID) | ORAL | 0 refills | Status: DC | PRN
Start: 2021-05-07 — End: 2021-06-19

## 2021-05-07 MED ORDER — PROCHLORPERAZINE MALEATE 10 MG PO TABS: 10.0000 mg | ORAL_TABLET | Freq: Four times a day (QID) | ORAL | 0 refills | Status: DC | PRN

## 2021-05-07 NOTE — Progress Notes (Signed)
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 Found to have enlargement of left supraclavicular lymph node with diffuse mediastinal and upper abdominal lymph nodes. 05/01/2021: [place] core biopsy of left supraclavicular lymph node. Findings consistent with metastatic adenocarcinoma. Lung vs pancreatic origin 05/06/2021: CT abdomen performed. Formal read pending. 05/07/2021: establish care with Dr. Lorenso Courier   CHIEF COMPLAINTS/PURPOSE OF CONSULTATION:  "Pancreatic Cancer "  HISTORY OF PRESENTING ILLNESS:  Lee Kim 75 y.o. male with medical history significant for HTN, obesity, OSA, diverticulitis, and erythrocytosis who presents for evaluation of newly diagnosed pancreatic adenocarcinoma.   On review of the previous records Lee Kim underwent a routine CT angio of the chest for monitoring of his thoracic aortic aneurysm was found to have an enlargement of the left supraclavicular lymph node with diffuse mediastinal and upper abdominal lymph nodes.  On 05/01/2021 he underwent a [place] core biopsy of this left supraclavicular lymph node.  Pathology results were consistent with metastatic adenocarcinoma, the primary which is either lung versus pancreatic origin.  The patient underwent a CT scan of his abdomen on 05/06/2021.  Due to concern for this metastatic adenocarcinoma the patient was referred to oncology for further evaluation and management.  On exam today Lee Kim is unaccompanied.  He notes that this all began when he was undergoing a routine CT scan with his cardiologist.  He notes that he has not been having any symptoms such as  weight loss, nausea, vomiting, or diarrhea.  He also denies any pain or shortness of breath.  He notes that his energy has been "so-so".  Overall he has been in his normal state of health and feels "fine".  On further discussion he notes that he previously smoked about 2 cigarettes per night but "quit a few years ago".  He notes he would drink a few glasses of wine with dinner every night but quit this during the pandemic.  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  His family history is remarkable for breast cancer in his mother and heart disease in his father.  He has 1 son who is healthy and fortunately had a brother who was murdered.  He notes he has never had any major surgeries.  He otherwise denies any fevers, chills, sweats, nausea, vomiting or diarrhea.  Full 10 point ROS is listed below  MEDICAL HISTORY:  Past Medical History:  Diagnosis Date   Colonic polyp    Diverticulitis    Erythrocytosis    Hypertension    Obesity    OSA (obstructive sleep apnea)     SURGICAL HISTORY: Past Surgical History:  Procedure Laterality Date   COLONOSCOPY  03/2003   Dr. Wynetta Emery   COLONOSCOPY WITH PROPOFOL N/A 11/23/2016   Procedure: COLONOSCOPY WITH PROPOFOL;  Surgeon: Garlan Fair, MD;  Location: [facility];  Service: Endoscopy;  Laterality: N/A;   EYE SURGERY      SOCIAL HISTORY: Social History   Socioeconomic History   Marital status: Married    Spouse name: Not on file   Number of children: 1   Years of education: Not on file   Highest education level: Not on file  Occupational History   Not on file  Tobacco Use  Smoking status: Every Day    Types: Cigarettes   Smokeless tobacco: Never  Vaping Use   Vaping Use: Never used  Substance and Sexual Activity   Alcohol use: Yes    Alcohol/week: 7.0 standard drinks    Types: 7 Glasses of wine per week    Comment: 3-4 glasses wine per night   Drug use: No   Sexual activity: Yes  Other Topics Concern   Not on file  Social  History Narrative   Not on file   Social Determinants of Health   Financial Resource Strain: Not on file  Food Insecurity: Not on file  Transportation Needs: Not on file  Physical Activity: Not on file  Stress: Not on file  Social Connections: Not on file  Intimate Partner Violence: Not on file    FAMILY HISTORY: Family History  Problem Relation Age of Onset   Hypertension Mother    Hypertension Father    Hypertension Sister     ALLERGIES:  is allergic to benzalkonium chloride.  MEDICATIONS:  Current Outpatient Medications  Medication Sig Dispense Refill   amLODipine (NORVASC) 10 MG tablet Take 1 tablet (10 mg total) by mouth daily. 90 tablet 3   cromolyn (NASALCROM) 5.2 MG/ACT nasal spray Place 1 spray into both nostrils 2 (two) times daily as needed for allergies.     ELIQUIS 5 MG TABS tablet TAKE 1 TABLET(5 MG) BY MOUTH TWICE DAILY (Patient taking differently: Take 5 mg by mouth 2 (two) times daily.) 180 tablet 0   lisinopril-hydrochlorothiazide (ZESTORETIC) 10-12.5 MG tablet TAKE 1 TABLET BY MOUTH DAILY 90 tablet 3   Multiple Vitamins-Minerals (PRESERVISION AREDS 2 PO) Take 1 tablet by mouth 2 (two) times daily.      rosuvastatin (CRESTOR) 40 MG tablet Take 40 mg by mouth daily.     tadalafil (CIALIS) 20 MG tablet TAKE 1 TABLET BY MOUTH EVERY DAY AS NEEDED (Patient taking differently: Take 10 mg by mouth daily as needed for erectile dysfunction.) 30 tablet 5   TRAVATAN Z 0.004 % SOLN ophthalmic solution Place 1 drop into the right eye at bedtime. [place]  12   triamcinolone (KENALOG) 0.1 % APPLY EXTERNALLY TO THE AFFECTED AREA TWICE DAILY (Patient taking differently: Apply 1 application topically daily as needed (psoriasis).) 45 g 5   No current facility-administered medications for this visit.    REVIEW OF SYSTEMS:   Constitutional: ( - ) fevers, ( - )  chills , ( - ) night sweats Eyes: ( - ) blurriness of vision, ( - ) double vision, ( - ) watery eyes Ears, nose,  mouth, throat, and face: ( - ) mucositis, ( - ) sore throat Respiratory: ( - ) cough, ( - ) dyspnea, ( - ) wheezes Cardiovascular: ( - ) palpitation, ( - ) chest discomfort, ( - ) lower extremity swelling Gastrointestinal:  ( - ) nausea, ( - ) heartburn, ( - ) change in bowel habits Skin: ( - ) abnormal skin rashes Lymphatics: ( - ) new lymphadenopathy, ( - ) easy bruising Neurological: ( - ) numbness, ( - ) tingling, ( - ) new weaknesses Behavioral/Psych: ( - ) mood change, ( - ) new changes  All other systems were reviewed with the patient and are negative.  PHYSICAL EXAMINATION: ECOG PERFORMANCE STATUS: 1 - Symptomatic but completely ambulatory  Vitals:   05/07/21 0907  BP: 131/80  Pulse: 83  Resp: 18  Temp: (!) 97.1 F (36.2 C)  SpO2: 100%   Filed  Weights   05/07/21 0907  Weight: 252 lb 1 oz (114.3 kg)    GENERAL: well appearing elderly Caucasian male in no acute distress.   SKIN: skin color, texture, turgor are normal, no rashes or significant lesions EYES: conjunctiva are pink and non-injected, sclera clear NECK: supple, non-tender LYMPH:  palpable lymphadenopathy left supraclavicular lymph nodes.  Fullness of the right cervical lymph nodes.  No other palpable lymph nodes. LUNGS: clear to auscultation and percussion with normal breathing effort HEART: regular rate & rhythm and no murmurs and no lower extremity edema ABDOMEN: soft, non-tender, non-distended, normal bowel sounds.  No HSM appreciated. Musculoskeletal: no cyanosis of digits and no clubbing  PSYCH: alert & oriented x 3, fluent speech NEURO: no focal motor/sensory deficits  LABORATORY DATA:  I have reviewed the data as listed CBC Latest Ref Rng & Units 04/08/2021 06/11/2020 03/27/2019  WBC 3.4 - 10.8 x10E3/uL 8.2 7.8 7.9  Hemoglobin 13.0 - 17.7 g/dL 13.4 17.8(H) 18.8(H)  Hematocrit 37.5 - 51.0 % 39.7 49.8 55.0(H)  Platelets 150 - 450 x10E3/uL 217 192 153    CMP Latest Ref Rng & Units 04/08/2021 03/23/2021  06/11/2020  Glucose 65 - 99 mg/dL 124(H) 120(H) 139(H)  BUN 8 - 27 mg/dL '27 26 22  ' Creatinine 0.76 - 1.27 mg/dL 1.26 1.51(H) 1.42(H)  Sodium 134 - 144 mmol/L 142 142 140  Potassium 3.5 - 5.2 mmol/L 4.2 4.0 4.5  Chloride 96 - 106 mmol/L 106 104 100  CO2 20 - 29 mmol/L '22 22 26  ' Calcium 8.6 - 10.2 mg/dL 9.3 9.3 9.7  Total Protein 6.0 - 8.5 g/dL 6.9 - 6.8  Total Bilirubin 0.0 - 1.2 mg/dL 0.7 - 0.7  Alkaline Phos 44 - 121 IU/L 214(H) - 112  AST 0 - 40 IU/L 31 - 27  ALT 0 - 44 IU/L 46(H) - 40     PATHOLOGY: SURGICAL PATHOLOGY  CASE: [id number]  PATIENT: Lee Kim  Surgical Pathology Report   Clinical History: Neck and mediastinal adenopathy (nt)   FINAL MICROSCOPIC DIAGNOSIS:   A. LYMPH NODE, LEFT NECK, NEEDLE CORE BIOPSY:  - Metastatic adenocarcinoma, see comment.   COMMENT:   Immunohistochemistry is positive for cytokeratin 7, TTF-1 (weak,  patchy), and CDX-2 (weak). NapsinA and cytokeratin 20 are negative. The  immunoprofile is somewhat non-specific with CDX-2 suggesting an upper GI  or pancreaticobiliary primary and TTF-1 possibly indicating a lung  primary. Clinical and radiographic correlation is recommended. Dr.  Martinique has reviewed the case. Dr. Lorenso Courier was notified on 05/06/2021.   GROSS DESCRIPTION:   Received fresh are several cores and irregular pieces of pink-gray to  dark red soft tissue, 0.5 x 0.3 x 0.1 cm in aggregate.  A portion of the  specimen is submitted in Streck media for possible flow cytometry, with  remaining specimen submitted in 1 block for routine histology.   Per Dr. Tresa Moore the tissue initially submitted for Flow is instead  submitted for routine histology in block 2.   SW 05/01/2021   Final Diagnosis performed by Vicente Males, MD.   Electronically signed  05/06/2021  RADIOGRAPHIC STUDIES: I have personally reviewed the radiological images as listed and agreed with the findings in the report: Enlarged lymph nodes of the chest.  Concern  for enlarged lymph nodes of the abdomen and possible pancreatic mass.  [place] CORE BIOPSY (LYMPH NODES)  Result Date: 05/01/2021 INDICATION: Mediastinal and left neck lymphadenopathy. EXAM: ULTRASOUND GUIDED CORE NEEDLE BIOPSY BIOPSY OF ENLARGED LEFT NECK LYMPH NODE. MEDICATIONS:  None. ANESTHESIA/SEDATION: Fentanyl 25 mcg IV; Versed 1 mg IV Moderate Sedation Time:  20 minutes The patient was continuously monitored during the procedure by the interventional radiology nurse under my direct supervision. PROCEDURE: The procedure, risks, benefits, and alternatives were explained to the patient. Questions regarding the procedure were encouraged and answered. The patient understands and consents to the procedure. The left neck skin was prepped with chlorhexidine in a sterile fashion, and a sterile drape was applied covering the operative field. A sterile gown and sterile gloves were used for the procedure. Local anesthesia was provided with 1% Lidocaine. Following local lidocaine administration, four 18 gauge cores were obtained from the enlarged left neck lymph node utilizing continuous ultrasound guidance. Samples were sent to pathology in sterile saline. Needle removed and hemostasis achieved with 2 minutes of manual compression. Post procedure ultrasound images showed no evidence of significant hemorrhage. COMPLICATIONS: None immediate. FINDINGS: Mildly enlarged left neck lymph node. IMPRESSION: Ultrasound-guided biopsy of enlarged left neck lymph node. Electronically Signed   By: Miachel Roux M.D.   On: 05/01/2021 16:54    ASSESSMENT & PLAN Lee Kim 75 y.o. male with medical history significant for HTN, obesity, OSA, diverticulitis, and erythrocytosis who presents for evaluation of newly diagnosed pancreatic adenocarcinoma.   After review of the labs, review of the records, and discussion with the patient the patients findings are most consistent with metastatic adenocarcinoma the pancreas.  This is  based on the pathological findings and the distribution of the lymph node involvement.  We will order tumor markers today in order to help confirm the diagnosis.  Lung adenocarcinoma certainly the differential but at this time without a clear lung primary I would favor metastatic pancreatic cancer.  At this time would recommend pursuing port placement with consideration of gemcitabine and Abraxane.  This regimen would consist of gemcitabine 1000 mg per metered squared IV on days 1, 8, 15 and albumin-bound paclitaxel 125 mg per metered squared on days 1, 8, 15 of a 28-day cycle.  This would be continued until intolerance or progression.  # Metastatic Adenocarcinoma of the Pancreas -- Findings at this time are most consistent with metastatic adenocarcinoma the pancreas.  We will confirm this with tumor markers to include CA 19-9, AFP, and CEA --Plan to proceed with port placement --Regimen of choice this time would be gemcitabine and Abraxane --We will order BRCA 1 and 2 mutational status to see if there are options for further therapy --Plan for start of chemotherapy in approximately 2 to 3 weeks time if the patient is agreeable  #Supportive Care -- chemotherapy education to be scheduled  -- port placement to be scheduled.  -- zofran 70m q8H PRN and compazine 121mPO q6H for nausea -- EMLA cream for port -- no pain medication required at this time.   No orders of the defined types were placed in this encounter.  All questions were answered. The patient knows to call the clinic with any problems, questions or concerns.  A total of more than 60 minutes were spent on this encounter with face-to-face time and non-face-to-face time, including preparing to see the patient, ordering tests and/or medications, counseling the patient and coordination of care as outlined above.   JoLedell PeoplesMD Department of Hematology/Oncology [place]:  [phone]: [phone]: [email or web address]  05/07/2021 9:22 AM

## 2021-05-07 NOTE — Progress Notes (Signed)
START ON PATHWAY REGIMEN - Pancreatic Adenocarcinoma     A cycle is every 28 days:     Nab-paclitaxel (protein bound)      Gemcitabine   **Always confirm dose/schedule in your pharmacy ordering system**  Patient Characteristics: Metastatic Disease, First Line, PS = 0,1, BRCA1/2 and PALB2  Mutation Absent/Unknown Therapeutic Status: Metastatic Disease Line of Therapy: First Line ECOG Performance Status: 1 BRCA1/2 Mutation Status: Awaiting Test Results PALB2 Mutation Status: Awaiting Test Results Intent of Therapy: Non-Curative / Palliative Intent, Discussed with Patient 

## 2021-05-08 LAB — CHROMOGRANIN A: Chromogranin A (ng/mL): 111.7 ng/mL — ABNORMAL HIGH (ref 0.0–101.8)

## 2021-05-08 LAB — AFP TUMOR MARKER: AFP, Serum, Tumor Marker: 2.7 ng/mL (ref 0.0–8.4)

## 2021-05-08 LAB — CANCER ANTIGEN 19-9: CA 19-9: 1870 U/mL — ABNORMAL HIGH (ref 0–35)

## 2021-05-11 ENCOUNTER — Telehealth: Payer: Self-pay | Admitting: *Deleted

## 2021-05-11 DIAGNOSIS — C259 Malignant neoplasm of pancreas, unspecified: Secondary | ICD-10-CM

## 2021-05-11 DIAGNOSIS — C772 Secondary and unspecified malignant neoplasm of intra-abdominal lymph nodes: Secondary | ICD-10-CM

## 2021-05-11 NOTE — Addendum Note (Signed)
Addended by: Aura Fey A on: 05/11/2021 11:03 AM   Modules accepted: Orders

## 2021-05-11 NOTE — Telephone Encounter (Signed)
TCT patient and reviewed scan results with him. Scan essentially confirms pancreatic cancer with spread to liver. Pt asked questions about how he would feel if he didn't do chemo as compared to how he would feel with the chemo.  He is trying to get best quality of life given his diease process.  Discussed chemo side effects as well as symptoms he may feel as tumor progresses. It is important to him to spend quality timer with his 28 1/73 year old grandchild and the rest of his family. Pt is tentatively scheduled for  port placement on 05/15/21. Pt states he will let me know what he decides before that day. Encouraged he and his wife to call back with any questions he may have this week.  Dr. Lorenso Courier aware of the above.

## 2021-05-11 NOTE — Telephone Encounter (Signed)
Received call from patient, requesting a call back from Dr. Lorenso Courier regarding the plan to address his pancreatic cancer.  Dr. Lorenso Courier made aware.

## 2021-05-12 ENCOUNTER — Telehealth: Payer: Self-pay | Admitting: Hematology and Oncology

## 2021-05-12 NOTE — Telephone Encounter (Signed)
Scheduled per los. Called and spoke with patient. Confirmed appt 

## 2021-05-13 ENCOUNTER — Other Ambulatory Visit: Payer: Self-pay | Admitting: Internal Medicine

## 2021-05-14 ENCOUNTER — Telehealth: Payer: Self-pay | Admitting: *Deleted

## 2021-05-14 NOTE — Telephone Encounter (Signed)
TCT patient regarding proposed port placement and chemo. Spoke with him and his wife. Pt is agreeable to starting treatment for his pancreatic cancer, He is scheduled for port placement tomorrow, chemo education on 05/18/21 and then chemo on 05/29/21. He did ask if he could stop the chemo  treatments if he changes his mind down the road. Advised that he could indeed change his mind. He states his wife and son have encouraged him to at least try. Advised that he son, Leonarda Salon has called. Asked pt if he was ok for me to talk to him. Both pt and wife stated it was fine. Will give ROI form to patient education nurse so it can be signed by pt next week.  Pt and wife aware of upcoming appts.

## 2021-05-15 ENCOUNTER — Ambulatory Visit (HOSPITAL_COMMUNITY)
Admission: RE | Admit: 2021-05-15 | Discharge: 2021-05-15 | Disposition: A | Discharge status: Home / Self Care | Payer: PPO | Source: Ambulatory Visit | Attending: Hematology and Oncology | Admitting: Hematology and Oncology

## 2021-05-15 ENCOUNTER — Encounter (HOSPITAL_COMMUNITY): Payer: Self-pay

## 2021-05-15 ENCOUNTER — Other Ambulatory Visit: Payer: Self-pay

## 2021-05-15 DIAGNOSIS — Z79899 Other long term (current) drug therapy: Secondary | ICD-10-CM | POA: Diagnosis not present

## 2021-05-15 DIAGNOSIS — C772 Secondary and unspecified malignant neoplasm of intra-abdominal lymph nodes: Secondary | ICD-10-CM

## 2021-05-15 DIAGNOSIS — F17210 Nicotine dependence, cigarettes, uncomplicated: Secondary | ICD-10-CM | POA: Diagnosis not present

## 2021-05-15 DIAGNOSIS — Z7901 Long term (current) use of anticoagulants: Secondary | ICD-10-CM | POA: Insufficient documentation

## 2021-05-15 DIAGNOSIS — Z452 Encounter for adjustment and management of vascular access device: Secondary | ICD-10-CM | POA: Diagnosis not present

## 2021-05-15 DIAGNOSIS — I4891 Unspecified atrial fibrillation: Secondary | ICD-10-CM | POA: Diagnosis not present

## 2021-05-15 DIAGNOSIS — G4733 Obstructive sleep apnea (adult) (pediatric): Secondary | ICD-10-CM | POA: Diagnosis not present

## 2021-05-15 DIAGNOSIS — Z888 Allergy status to other drugs, medicaments and biological substances status: Secondary | ICD-10-CM | POA: Insufficient documentation

## 2021-05-15 DIAGNOSIS — I10 Essential (primary) hypertension: Secondary | ICD-10-CM | POA: Insufficient documentation

## 2021-05-15 DIAGNOSIS — C259 Malignant neoplasm of pancreas, unspecified: Secondary | ICD-10-CM | POA: Insufficient documentation

## 2021-05-15 DIAGNOSIS — E669 Obesity, unspecified: Secondary | ICD-10-CM | POA: Diagnosis not present

## 2021-05-15 HISTORY — PX: IR IMAGING GUIDED PORT INSERTION: IMG5740

## 2021-05-15 MED ORDER — LIDOCAINE-EPINEPHRINE 1 %-1:100000 IJ SOLN
INTRAMUSCULAR | Status: AC
  Filled 2021-05-15: qty 1

## 2021-05-15 MED ORDER — LIDOCAINE HCL (PF) 1 % IJ SOLN
INTRAMUSCULAR | Status: AC | PRN
  Administered 2021-05-15: 10 mL via INTRADERMAL

## 2021-05-15 MED ORDER — FENTANYL CITRATE (PF) 100 MCG/2ML IJ SOLN
INTRAMUSCULAR | Status: AC | PRN
  Administered 2021-05-15 (×2): 50 ug via INTRAVENOUS

## 2021-05-15 MED ORDER — HEPARIN SOD (PORK) LOCK FLUSH 100 UNIT/ML IV SOLN
INTRAVENOUS | Status: AC | PRN
  Administered 2021-05-15: 500 [IU] via INTRAVENOUS

## 2021-05-15 MED ORDER — MIDAZOLAM HCL 2 MG/2ML IJ SOLN
INTRAMUSCULAR | Status: AC | PRN
  Administered 2021-05-15 (×3): 1 mg via INTRAVENOUS

## 2021-05-15 MED ORDER — SODIUM CHLORIDE 0.9 % IV SOLN: INTRAVENOUS | Status: DC

## 2021-05-15 MED ORDER — FENTANYL CITRATE (PF) 100 MCG/2ML IJ SOLN
INTRAMUSCULAR | Status: AC
  Filled 2021-05-15: qty 2

## 2021-05-15 MED ORDER — LIDOCAINE HCL 1 % IJ SOLN
INTRAMUSCULAR | Status: AC
  Filled 2021-05-15: qty 20

## 2021-05-15 MED ORDER — MIDAZOLAM HCL 2 MG/2ML IJ SOLN
INTRAMUSCULAR | Status: AC
  Filled 2021-05-15: qty 2

## 2021-05-15 MED ORDER — HEPARIN SOD (PORK) LOCK FLUSH 100 UNIT/ML IV SOLN
INTRAVENOUS | Status: AC
  Filled 2021-05-15: qty 5

## 2021-05-15 MED ORDER — LIDOCAINE-EPINEPHRINE (PF) 2 %-1:200000 IJ SOLN
INTRAMUSCULAR | Status: AC
  Filled 2021-05-15: qty 20

## 2021-05-15 MED ORDER — LIDOCAINE-EPINEPHRINE (PF) 2 %-1:200000 IJ SOLN
INTRAMUSCULAR | Status: AC | PRN
  Administered 2021-05-15: 10 mL via INTRADERMAL

## 2021-05-15 NOTE — Procedures (Signed)
Interventional Radiology Procedure:   Indications: Pancreatic cancer  Procedure: Port placement  Findings: Right jugular port, tip at SVC/RA junction  Complications: None     EBL: Minimal, less than 10 ml  Plan: Discharge in one hour.  Keep port site and incisions dry for at least 24 hours.     Laurenashley Viar R. Maeve Debord, MD  Pager: [phone]   

## 2021-05-15 NOTE — Consult Note (Signed)
Chief Complaint: Patient was seen in consultation today for port-a-cath placement  Referring Physician(s): Dorsey,John T IV  Supervising Physician: Markus Daft  Patient Status: [place]  History of Present Illness: Lee Kim is a 75 y.o. male w/ PMH of HTN, a fib, obesity, OSA. He has recent dx of pancreatic adenocarcinoma. He is here today for port placement to receive chemotherapy.   Past Medical History:  Diagnosis Date   Colonic polyp    Diverticulitis    Erythrocytosis    Hypertension    Obesity    OSA (obstructive sleep apnea)     Past Surgical History:  Procedure Laterality Date   COLONOSCOPY  03/2003   Dr. Wynetta Emery   COLONOSCOPY WITH PROPOFOL N/A 11/23/2016   Procedure: COLONOSCOPY WITH PROPOFOL;  Surgeon: Garlan Fair, MD;  Location: [facility];  Service: Endoscopy;  Laterality: N/A;   EYE SURGERY      Allergies: Benzalkonium chloride  Medications: Prior to Admission medications   Medication Sig Start Date End Date Taking? Authorizing Provider  amLODipine (NORVASC) 10 MG tablet Take 1 tablet (10 mg total) by mouth daily. 06/11/20  Yes [name], MD  cromolyn (NASALCROM) 5.2 MG/ACT nasal spray Place 1 spray into both nostrils 2 (two) times daily as needed for allergies.   Yes [provider]  ELIQUIS 5 MG TABS tablet TAKE 1 TABLET(5 MG) BY MOUTH TWICE DAILY Patient taking differently: Take 5 mg by mouth 2 (two) times daily. 03/20/21  Yes [name], MD  lisinopril-hydrochlorothiazide (ZESTORETIC) 10-12.5 MG tablet TAKE 1 TABLET BY MOUTH DAILY 06/11/20  Yes [name], MD  Multiple Vitamins-Minerals (PRESERVISION AREDS 2 PO) Take 1 tablet by mouth 2 (two) times daily.    Yes [provider]  rosuvastatin (CRESTOR) 40 MG tablet Take 40 mg by mouth daily.   Yes [provider]  TRAVATAN Z 0.004 % SOLN ophthalmic solution Place 1 drop into the right eye at bedtime. Trav[place]ost 07/08/16  Yes [provider]  triamcinolone (KENALOG) 0.1 % APPLY EXTERNALLY TO THE AFFECTED AREA TWICE DAILY Patient taking differently: Apply 1 application topically daily as needed (psoriasis). 10/14/20  Yes [name], MD  lidocaine-prilocaine (EMLA) cream Apply 1 application topically as needed. 05/07/21   Orson Slick, MD  ondansetron (ZOFRAN) 8 MG tablet Take 1 tablet (8 mg total) by mouth every 8 (eight) hours as needed for nausea or vomiting. 05/07/21   Orson Slick, MD  prochlorperazine (COMPAZINE) 10 MG tablet Take 1 tablet (10 mg total) by mouth every 6 (six) hours as needed for nausea or vomiting. 05/07/21   Orson Slick, MD  tadalafil (CIALIS) 20 MG tablet TAKE 1 TABLET BY MOUTH EVERY DAY AS NEEDED Patient taking differently: Take 10 mg by mouth daily as needed for erectile dysfunction. 06/11/20   [name], MD     Family History  Problem Relation Age of Onset   Hypertension Mother    Hypertension Father    Hypertension Sister     Social History   Socioeconomic History   Marital status: Married    Spouse name: Not on file   Number of children: 1   Years of education: Not on file   Highest education level: Not on file  Occupational History   Not on file  Tobacco Use   Smoking status: Every Day    Types: Cigarettes   Smokeless tobacco: Never  Vaping Use   Vaping  Use: Never used  Substance and Sexual Activity   Alcohol use: Yes    Alcohol/week: 7.0 standard drinks    Types: 7 Glasses of wine per week    Comment: 3-4 glasses wine per night   Drug use: No   Sexual activity: Yes  Other Topics Concern   Not on file  Social History Narrative   Not on file   Social Determinants of Health   Financial Resource Strain: Not on file  Food Insecurity: Not on file  Transportation Needs: Not on file  Physical Activity: Not on file  Stress: Not on file  Social Connections: Not on file      Review of Systems  Constitutional:  Negative for chills and fever.   Respiratory:  Negative for cough and shortness of breath.   Cardiovascular:  Negative for chest pain and leg swelling.  Gastrointestinal:  Negative for abdominal pain, nausea and vomiting.   Vital Signs: BP 109/80   Pulse 62   Temp 98 F (36.7 C) (Oral)   Resp 18   SpO2 100%   Physical Exam Constitutional:      Appearance: Normal appearance.  HENT:     Mouth/Throat:     Mouth: Mucous membranes are moist.     Pharynx: Oropharynx is clear.  Cardiovascular:     Rate and Rhythm: Normal rate. Rhythm irregular.     Pulses: Normal pulses.  Pulmonary:     Effort: Pulmonary effort is normal.     Breath sounds: No wheezing, rhonchi or rales.  Abdominal:     General: Bowel sounds are normal.     Tenderness: There is no abdominal tenderness. There is no guarding.  Musculoskeletal:     Right lower leg: No edema.     Left lower leg: No edema.  Skin:    General: Skin is warm and dry.  Neurological:     Mental Status: He is alert.  Psychiatric:        Mood and Affect: Mood normal.        Behavior: Behavior normal.        Thought Content: Thought content normal.        Judgment: Judgment normal.    Imaging: CT Abdomen Pelvis W Contrast  Result Date: 05/07/2021 CLINICAL DATA:  Thoracic lymphadenopathy. Evaluate for primary malignancy. EXAM: CT ABDOMEN AND PELVIS WITH CONTRAST TECHNIQUE: Multidetector CT imaging of the abdomen and pelvis was performed using the standard protocol following bolus administration of intravenous contrast. CONTRAST:  178m ISOVUE-300 IOPAMIDOL (ISOVUE-300) INJECTION 61% COMPARISON:  Chest CTA on 03/31/2021 FINDINGS: Lower Chest: No acute findings. Hepatobiliary: A soft tissue mass is seen involving the gallbladder neck, measuring approximately 3.2 x 3.0 cm on image 36/2. Ill-defined low-attenuation lesion is seen in the adjacent medial aspect of the right hepatic lobe which measures 2.5 by 2.0 cm on image 37/2. This is suspicious for invasion of the adjacent  liver. The gallbladder is distended, however there is no evidence of acute cholecystitis. Mild diffuse biliary ductal dilatation is seen. Pancreas: A low-attenuation mass is seen region of the pancreatic head which measures 3.6 x 2.6 cm on image 32/2. No other pancreatic masses are identified and there is no evidence of pancreatic ductal dilatation. This could represent a primary pancreatic carcinoma or peripancreatic lymphadenopathy. Spleen: No evidence of splenomegaly. Wedge-shaped low-attenuation lesion is seen in the posterior and superior aspect of the spleen, consistent with small splenic infarct. Adrenals/Urinary Tract: Multiple small renal lesions are seen bilaterally, some of which  have higher than fluid attenuation. These could represent hemorrhagic cysts although small solid renal masses cannot be excluded. No evidence of ureteral calculi or hydronephrosis. Unremarkable unopacified urinary bladder. Stomach/Bowel: No evidence of obstruction, inflammatory process or abnormal fluid collections. Vascular/Lymphatic: Porta hepatis and peripancreatic lymphadenopathy is seen with index lymph node measuring 2.8 x 2.3 cm on image 28/2. Retroperitoneal lymphadenopathy is seen in the aortocaval space, with index lymph node measuring 3.7 x 2.7 cm. Mildly enlarged right common iliac lymph node is seen measuring 1.4 cm short axis. Aortic atherosclerotic calcification noted. No acute vascular findings. Reproductive:  No mass or other significant abnormality. Other:  None. Musculoskeletal:  No suspicious bone lesions identified. IMPRESSION: 3.6 cm low-attenuation mass involving the gallbladder neck, with probable invasion of the adjacent right hepatic lobe. Differential diagnosis includes primary gallbladder carcinoma or metastatic disease. 3.6 cm low-attenuation mass involving the pancreatic head, which could represent a primary pancreatic carcinoma or metastatic lymphadenopathy. Abdominal and right common iliac  lymphadenopathy, consistent with metastatic disease. Mild diffuse biliary ductal dilatation. Small splenic infarct. Several small indeterminate bilateral renal lesions, which could represent hemorrhagic cysts although small solid renal masses cannot be excluded. Recommend continued attention on follow-up imaging, or abdomen MRI without and with contrast for further characterization. Aortic Atherosclerosis (ICD10-I70.0). Electronically Signed   By: Marlaine Hind M.D.   On: 05/07/2021 10:00   [place] CORE BIOPSY (LYMPH NODES)  Result Date: 05/01/2021 INDICATION: Mediastinal and left neck lymphadenopathy. EXAM: ULTRASOUND GUIDED CORE NEEDLE BIOPSY BIOPSY OF ENLARGED LEFT NECK LYMPH NODE. MEDICATIONS: None. ANESTHESIA/SEDATION: Fentanyl 25 mcg IV; Versed 1 mg IV Moderate Sedation Time:  20 minutes The patient was continuously monitored during the procedure by the interventional radiology nurse under my direct supervision. PROCEDURE: The procedure, risks, benefits, and alternatives were explained to the patient. Questions regarding the procedure were encouraged and answered. The patient understands and consents to the procedure. The left neck skin was prepped with chlorhexidine in a sterile fashion, and a sterile drape was applied covering the operative field. A sterile gown and sterile gloves were used for the procedure. Local anesthesia was provided with 1% Lidocaine. Following local lidocaine administration, four 18 gauge cores were obtained from the enlarged left neck lymph node utilizing continuous ultrasound guidance. Samples were sent to pathology in sterile saline. Needle removed and hemostasis achieved with 2 minutes of manual compression. Post procedure ultrasound images showed no evidence of significant hemorrhage. COMPLICATIONS: None immediate. FINDINGS: Mildly enlarged left neck lymph node. IMPRESSION: Ultrasound-guided biopsy of enlarged left neck lymph node. Electronically Signed   By: Miachel Roux M.D.    On: 05/01/2021 16:54    Labs:  CBC: Recent Labs    06/11/20 0922 04/08/21 0951 05/07/21 1008  WBC 7.8 8.2 8.4  HGB 17.8* 13.4 12.3*  HCT 49.8 39.7 37.6*  PLT 192 217 157    COAGS: No results for input(s): INR, APTT in the last 8760 hours.  BMP: Recent Labs    06/11/20 0922 03/23/21 0825 04/08/21 0951 05/07/21 1008  NA 140 142 142 144  K 4.5 4.0 4.2 4.5  CL 100 104 106 109  CO2 '26 22 22 26  '$ GLUCOSE 139* 120* 124* 120*  BUN '22 26 27 '$ 34*  CALCIUM 9.7 9.3 9.3 9.5  CREATININE 1.42* 1.51* 1.26 1.70*  GFRNONAA 49*  --   --  42*  GFRAA 57*  --   --   --     LIVER FUNCTION TESTS: Recent Labs    06/11/20 [id number] 04/08/21  [id number] 05/07/21 1008  BILITOT 0.7 0.7 0.8  AST 27 31 34  ALT 40 46* 33  ALKPHOS 112 214* 159*  PROT 6.8 6.9 7.0  ALBUMIN 4.2 3.9 3.6    TUMOR MARKERS: No results for input(s): AFPTM, CEA, CA199, CHROMGRNA in the last 8760 hours.  Assessment and Plan: PMH of HTN, a fib, obesity, OSA. He has recent dx of pancreatic adenocarcinoma. He is here today for port placement to receive chemotherapy. Pt reports he took his Eliquis yesterday.  Risks and benefits of image guided port-a-catheter placement was discussed with the patient including, but not limited to bleeding, infection, pneumothorax, or fibrin sheath development and need for additional procedures.  All of the patient's questions were answered, patient is agreeable to proceed. Consent signed and in chart.    Thank you for this interesting consult.  I greatly enjoyed meeting Lee Kim and look forward to participating in their care.  A copy of this report was sent to the requesting provider on this date.  Electronically Signed: Tyson Alias, NP 05/15/2021, 11:26 AM   I spent a total of 20 minutes in face to face in clinical consultation, greater than 50% of which was counseling/coordinating care for port-a-cath placement.

## 2021-05-15 NOTE — Discharge Instructions (Signed)

## 2021-05-18 ENCOUNTER — Inpatient Hospital Stay: Payer: PPO

## 2021-05-18 ENCOUNTER — Other Ambulatory Visit: Payer: Self-pay

## 2021-05-18 ENCOUNTER — Telehealth: Payer: Self-pay | Admitting: *Deleted

## 2021-05-18 NOTE — Telephone Encounter (Signed)
TCT patient regarding changing his 1st chemo appt to this week at his request.  Spoke with him and advised that we were able to schedule him for port flush and labs, Dr. Irene Limbo (for Dr. Lorenso Courier) and then chemo on 05/21/21 starting at 11am.  Pt voiced understanding and appreciation for the change.

## 2021-05-20 NOTE — Progress Notes (Signed)
Pharmacist Chemotherapy Monitoring - Initial Assessment    Anticipated start date: 05/21/21   The following has been reviewed per standard work regarding the patient's treatment regimen: The patient's diagnosis, treatment plan and drug doses, and organ/hematologic function Lab orders and baseline tests specific to treatment regimen  The treatment plan start date, drug sequencing, and pre-medications Prior authorization status  Patient's documented medication list, including drug-drug interaction screen and prescriptions for anti-emetics and supportive care specific to the treatment regimen The drug concentrations, fluid compatibility, administration routes, and timing of the medications to be used The patient's access for treatment and lifetime cumulative dose history, if applicable  The patient's medication allergies and previous infusion related reactions, if applicable   Changes made to treatment plan:  treatment plan date  Follow up needed:  N/A    Kennith Center, Pharm.D., CPP 05/20/2021'@3'$ :43 PM

## 2021-05-21 ENCOUNTER — Ambulatory Visit
Admission: RE | Admit: 2021-05-21 | Discharge: 2021-05-21 | Disposition: A | Discharge status: Home / Self Care | Payer: PPO | Source: Ambulatory Visit | Attending: Family Medicine | Admitting: Family Medicine

## 2021-05-21 ENCOUNTER — Inpatient Hospital Stay: Payer: PPO

## 2021-05-21 ENCOUNTER — Inpatient Hospital Stay: Payer: PPO | Admitting: Hematology

## 2021-05-21 ENCOUNTER — Other Ambulatory Visit: Payer: Self-pay

## 2021-05-21 VITALS — BP 140/62 | HR 74 | Temp 98.1°F | Resp 18 | Ht 73.0 in | Wt 248.0 lb

## 2021-05-21 DIAGNOSIS — G9389 Other specified disorders of brain: Secondary | ICD-10-CM | POA: Diagnosis not present

## 2021-05-21 DIAGNOSIS — C772 Secondary and unspecified malignant neoplasm of intra-abdominal lymph nodes: Secondary | ICD-10-CM

## 2021-05-21 DIAGNOSIS — C259 Malignant neoplasm of pancreas, unspecified: Secondary | ICD-10-CM

## 2021-05-21 DIAGNOSIS — H5340 Unspecified visual field defects: Secondary | ICD-10-CM

## 2021-05-21 DIAGNOSIS — Z5111 Encounter for antineoplastic chemotherapy: Secondary | ICD-10-CM | POA: Diagnosis not present

## 2021-05-21 DIAGNOSIS — Z95828 Presence of other vascular implants and grafts: Secondary | ICD-10-CM

## 2021-05-21 LAB — CBC WITH DIFFERENTIAL (CANCER CENTER ONLY)
Abs Immature Granulocytes: 0.04 10*3/uL (ref 0.00–0.07)
Basophils Absolute: 0 10*3/uL (ref 0.0–0.1)
Basophils Relative: 1 %
Eosinophils Absolute: 0.2 10*3/uL (ref 0.0–0.5)
Eosinophils Relative: 3 %
HCT: 33.3 % — ABNORMAL LOW (ref 39.0–52.0)
Hemoglobin: 11.1 g/dL — ABNORMAL LOW (ref 13.0–17.0)
Immature Granulocytes: 1 %
Lymphocytes Relative: 13 %
Lymphs Abs: 0.8 10*3/uL (ref 0.7–4.0)
MCH: 32.2 pg (ref 26.0–34.0)
MCHC: 33.3 g/dL (ref 30.0–36.0)
MCV: 96.5 fL (ref 80.0–100.0)
Monocytes Absolute: 0.7 10*3/uL (ref 0.1–1.0)
Monocytes Relative: 11 %
Neutro Abs: 4.5 10*3/uL (ref 1.7–7.7)
Neutrophils Relative %: 71 %
Platelet Count: 143 10*3/uL — ABNORMAL LOW (ref 150–400)
RBC: 3.45 MIL/uL — ABNORMAL LOW (ref 4.22–5.81)
RDW: 15.6 % — ABNORMAL HIGH (ref 11.5–15.5)
WBC Count: 6.2 10*3/uL (ref 4.0–10.5)
nRBC: 0 % (ref 0.0–0.2)

## 2021-05-21 LAB — CMP (CANCER CENTER ONLY)
ALT: 31 U/L (ref 0–44)
AST: 29 U/L (ref 15–41)
Albumin: 3.5 g/dL (ref 3.5–5.0)
Alkaline Phosphatase: 164 U/L — ABNORMAL HIGH (ref 38–126)
Anion gap: 9 (ref 5–15)
BUN: 32 mg/dL — ABNORMAL HIGH (ref 8–23)
CO2: 22 mmol/L (ref 22–32)
Calcium: 9.2 mg/dL (ref 8.9–10.3)
Chloride: 110 mmol/L (ref 98–111)
Creatinine: 1.29 mg/dL — ABNORMAL HIGH (ref 0.61–1.24)
GFR, Estimated: 59 mL/min — ABNORMAL LOW (ref >60)
Glucose, Bld: 103 mg/dL — ABNORMAL HIGH (ref 70–99)
Potassium: 3.8 mmol/L (ref 3.5–5.1)
Sodium: 141 mmol/L (ref 135–145)
Total Bilirubin: 1.1 mg/dL (ref 0.3–1.2)
Total Protein: 6.7 g/dL (ref 6.5–8.1)

## 2021-05-21 MED ORDER — SODIUM CHLORIDE 0.9 % IV SOLN
1000.0000 mg/m2 | Freq: Once | INTRAVENOUS | Status: AC
  Administered 2021-05-21: 2432 mg via INTRAVENOUS
  Filled 2021-05-21: qty 63.96

## 2021-05-21 MED ORDER — ACETAMINOPHEN 500 MG PO TABS
1000.0000 mg | ORAL_TABLET | Freq: Once | ORAL | Status: AC
  Administered 2021-05-21: 1000 mg via ORAL
  Filled 2021-05-21: qty 2

## 2021-05-21 MED ORDER — LORATADINE 10 MG PO TABS
10.0000 mg | ORAL_TABLET | Freq: Every day | ORAL | Status: DC
  Administered 2021-05-21: 10 mg via ORAL
  Filled 2021-05-21: qty 1

## 2021-05-21 MED ORDER — GADOBENATE DIMEGLUMINE 529 MG/ML IV SOLN
20.0000 mL | Freq: Once | INTRAVENOUS | Status: AC | PRN
  Administered 2021-05-21: 20 mL via INTRAVENOUS

## 2021-05-21 MED ORDER — SODIUM CHLORIDE 0.9% FLUSH
10.0000 mL | INTRAVENOUS | Status: AC | PRN
  Administered 2021-05-21: 10 mL

## 2021-05-21 MED ORDER — FAMOTIDINE 20 MG IN NS 100 ML IVPB
20.0000 mg | Freq: Once | INTRAVENOUS | Status: AC
  Administered 2021-05-21: 20 mg via INTRAVENOUS
  Filled 2021-05-21: qty 100

## 2021-05-21 MED ORDER — SODIUM CHLORIDE 0.9 % IV SOLN: Freq: Once | INTRAVENOUS | Status: AC

## 2021-05-21 MED ORDER — SODIUM CHLORIDE 0.9% FLUSH
10.0000 mL | INTRAVENOUS | Status: DC | PRN
Start: 2021-05-21 — End: 2021-05-21
  Administered 2021-05-21: 10 mL

## 2021-05-21 MED ORDER — SODIUM CHLORIDE 0.9 % IV SOLN: Freq: Once | INTRAVENOUS | Status: DC

## 2021-05-21 MED ORDER — HEPARIN SOD (PORK) LOCK FLUSH 100 UNIT/ML IV SOLN
500.0000 [IU] | Freq: Once | INTRAVENOUS | Status: AC | PRN
Start: 2021-05-21 — End: 2021-05-21
  Administered 2021-05-21: 500 [IU]

## 2021-05-21 MED ORDER — SODIUM CHLORIDE 0.9 % IV SOLN
10.0000 mg | Freq: Once | INTRAVENOUS | Status: AC
  Administered 2021-05-21: 10 mg via INTRAVENOUS
  Filled 2021-05-21: qty 10

## 2021-05-21 MED ORDER — PACLITAXEL PROTEIN-BOUND CHEMO INJECTION 100 MG
125.0000 mg/m2 | Freq: Once | INTRAVENOUS | Status: AC
  Administered 2021-05-21: 300 mg via INTRAVENOUS
  Filled 2021-05-21: qty 60

## 2021-05-21 MED ORDER — ONDANSETRON HCL 4 MG/2ML IJ SOLN
8.0000 mg | Freq: Once | INTRAMUSCULAR | Status: AC
  Administered 2021-05-21: 8 mg via INTRAVENOUS
  Filled 2021-05-21: qty 4

## 2021-05-21 NOTE — Patient Instructions (Addendum)
[facility]  Discharge Instructions: Thank you for choosing [facility] to provide your oncology and hematology care.   If you have a lab appointment with the [facility], please go directly to the [facility] and check in at the registration area.   Wear comfortable clothing and clothing appropriate for easy access to any Portacath or PICC line.   We strive to give you quality time with your provider. You may need to reschedule your appointment if you arrive late (15 or more minutes).  Arriving late affects you and other patients whose appointments are after yours.  Also, if you miss three or more appointments without notifying the office, you may be dismissed from the clinic at the provider's discretion.      For prescription refill requests, have your pharmacy contact our office and allow 72 hours for refills to be completed.    Today you received the following chemotherapy and/or immunotherapy agents:  Abraxane/Gemzar      To help prevent nausea and vomiting after your treatment, we encourage you to take your nausea medication as directed.  BELOW ARE SYMPTOMS THAT SHOULD BE REPORTED IMMEDIATELY: *FEVER GREATER THAN 100.4 F (38 C) OR HIGHER *CHILLS OR SWEATING *NAUSEA AND VOMITING THAT IS NOT CONTROLLED WITH YOUR NAUSEA MEDICATION *UNUSUAL SHORTNESS OF BREATH *UNUSUAL BRUISING OR BLEEDING *URINARY PROBLEMS (pain or burning when urinating, or frequent urination) *BOWEL PROBLEMS (unusual diarrhea, constipation, pain near the anus) TENDERNESS IN MOUTH AND THROAT WITH OR WITHOUT PRESENCE OF ULCERS (sore throat, sores in mouth, or a toothache) UNUSUAL RASH, SWELLING OR PAIN  UNUSUAL VAGINAL DISCHARGE OR ITCHING   Items with * indicate a potential emergency and should be followed up as soon as possible or go to the Emergency Department if any problems should occur.  Please show the CHEMOTHERAPY ALERT CARD or IMMUNOTHERAPY ALERT CARD at  check-in to the Emergency Department and triage nurse.  Should you have questions after your visit or need to cancel or reschedule your appointment, please contact [facility]  Dept: [phone]  and follow the prompts.  Office hours are 8:00 a.m. to 4:30 p.m. Monday - Friday. Please note that voicemails left after 4:00 p.m. may not be returned until the following business day.  We are closed weekends and major holidays. You have access to a nurse at all times for urgent questions. Please call the main number to the clinic Dept: [phone] and follow the prompts.   For any non-urgent questions, you may also contact your provider using MyChart. We now offer e-Visits for anyone 83 and older to request care online for non-urgent symptoms. For details visit [email or web address].   Also download the MyChart app! Go to the app store, search "MyChart", open the app, select [facility], and log in with your MyChart username and password.  Due to Covid, a mask is required upon entering the hospital/clinic. If you do not have a mask, one will be given to you upon arrival. For doctor visits, patients may have 1 support person aged 71 or older with them. For treatment visits, patients cannot have anyone with them due to current Covid guidelines and our immunocompromised population.   Nanoparticle Albumin-Bound Paclitaxel injection What is this medication? NANOPARTICLE ALBUMIN-BOUND PACLITAXEL (Na no PAHR ti kuhl al BYOO muhn-bound PAK li TAX el) is a chemotherapy drug. It targets fast dividing cells, like cancer cells, and causes these cells to die. This medicine is used to treatadvanced breast  cancer, lung cancer, and pancreatic cancer. This medicine may be used for other purposes; ask your health care provider orpharmacist if you have questions. COMMON BRAND NAME(S): Abraxane What should I tell my care team before I take this medication? They need to know if you have any of  these conditions: kidney disease liver disease low blood counts, like low white cell, platelet, or red cell counts lung or breathing disease, like asthma tingling of the fingers or toes, or other nerve disorder an unusual or allergic reaction to paclitaxel, albumin, other chemotherapy, other medicines, foods, dyes, or preservatives pregnant or trying to get pregnant breast-feeding How should I use this medication? This drug is given as an infusion into a vein. It is administered in a hospitalor clinic by a specially trained health care professional. Talk to your pediatrician regarding the use of this medicine in children.Special care may be needed. Overdosage: If you think you have taken too much of this medicine contact apoison control center or emergency room at once. NOTE: This medicine is only for you. Do not share this medicine with others. What if I miss a dose? It is important not to miss your dose. Call your doctor or health careprofessional if you are unable to keep an appointment. What may interact with this medication? This medicine may interact with the following medications: antiviral medicines for hepatitis, HIV or AIDS certain antibiotics like erythromycin and clarithromycin certain medicines for fungal infections like ketoconazole and itraconazole certain medicines for seizures like carbamazepine, phenobarbital, phenytoin gemfibrozil nefazodone rifampin St. John's wort This list may not describe all possible interactions. Give your health care provider a list of all the medicines, herbs, non-prescription drugs, or dietary supplements you use. Also tell them if you smoke, drink alcohol, or use illegaldrugs. Some items may interact with your medicine. What should I watch for while using this medication? Your condition will be monitored carefully while you are receiving this medicine. You will need important blood work done while you are taking thismedicine. This medicine  can cause serious allergic reactions. If you experience allergic reactions like skin rash, itching or hives, swelling of the face, lips, ortongue, tell your doctor or health care professional right away. In some cases, you may be given additional medicines to help with side effects.Follow all directions for their use. This drug may make you feel generally unwell. This is not uncommon, as chemotherapy can affect healthy cells as well as cancer cells. Report any side effects. Continue your course of treatment even though you feel ill unless yourdoctor tells you to stop. Call your doctor or health care professional for advice if you get a fever, chills or sore throat, or other symptoms of a cold or flu. Do not treat yourself. This drug decreases your body's ability to fight infections. Try toavoid being around people who are sick. This medicine may increase your risk to bruise or bleed. Call your doctor orhealth care professional if you notice any unusual bleeding. Be careful brushing and flossing your teeth or using a toothpick because you may get an infection or bleed more easily. If you have any dental work done,tell your dentist you are receiving this medicine. Avoid taking products that contain aspirin, acetaminophen, ibuprofen, naproxen, or ketoprofen unless instructed by your doctor. These medicines may hide afever. Do not become pregnant while taking this medicine or for 6 months after stopping it. Women should inform their doctor if they wish to become pregnant or think they might be pregnant. Men should not  father a child while taking this medicine or for 3 months after stopping it. There is a potential for serious side effects to an unborn child. Talk to your health care professionalor pharmacist for more information. Do not breast-feed an infant while taking this medicine or for 2 weeks afterstopping it. This medicine may interfere with the ability to get pregnant or to father a child. You should  talk to your doctor or health care professional if you areconcerned about your fertility. What side effects may I notice from receiving this medication? Side effects that you should report to your doctor or health care professionalas soon as possible: allergic reactions like skin rash, itching or hives, swelling of the face, lips, or tongue breathing problems changes in vision fast, irregular heartbeat low blood pressure mouth sores pain, tingling, numbness in the hands or feet signs of decreased platelets or bleeding - bruising, pinpoint red spots on the skin, black, tarry stools, blood in the urine signs of decreased red blood cells - unusually weak or tired, feeling faint or lightheaded, falls signs of infection - fever or chills, cough, sore throat, pain or difficulty passing urine signs and symptoms of liver injury like dark yellow or brown urine; general ill feeling or flu-like symptoms; light-colored stools; loss of appetite; nausea; right upper belly pain; unusually weak or tired; yellowing of the eyes or skin swelling of the ankles, feet, hands unusually slow heartbeat Side effects that usually do not require medical attention (report to yourdoctor or health care professional if they continue or are bothersome): diarrhea hair loss loss of appetite nausea, vomiting tiredness This list may not describe all possible side effects. Call your doctor for medical advice about side effects. You may report side effects to FDA at1-800-FDA-1088. Where should I keep my medication? This drug is given in a hospital or clinic and will not be stored at home. NOTE: This sheet is a summary. It may not cover all possible information. If you have questions about this medicine, talk to your doctor, pharmacist, orhealth care provider.  2022 Elsevier/Gold Standard (2017-05-24 13:03:45)  Gemcitabine injection What is this medication? GEMCITABINE (jem SYE ta been) is a chemotherapy drug. This medicine  is used to treat many types of cancer like breast cancer, lung cancer, pancreatic cancer,and ovarian cancer. This medicine may be used for other purposes; ask your health care provider orpharmacist if you have questions. COMMON BRAND NAME(S): Gemzar, Infugem What should I tell my care team before I take this medication? They need to know if you have any of these conditions: blood disorders infection kidney disease liver disease lung or breathing disease, like asthma recent or ongoing radiation therapy an unusual or allergic reaction to gemcitabine, other chemotherapy, other medicines, foods, dyes, or preservatives pregnant or trying to get pregnant breast-feeding How should I use this medication? This drug is given as an infusion into a vein. It is administered in a hospitalor clinic by a specially trained health care professional. Talk to your pediatrician regarding the use of this medicine in children.Special care may be needed. Overdosage: If you think you have taken too much of this medicine contact apoison control center or emergency room at once. NOTE: This medicine is only for you. Do not share this medicine with others. What if I miss a dose? It is important not to miss your dose. Call your doctor or health careprofessional if you are unable to keep an appointment. What may interact with this medication? medicines to increase blood counts like  filgrastim, pegfilgrastim, sargramostim some other chemotherapy drugs like cisplatin vaccines Talk to your doctor or health care professional before taking any of thesemedicines: acetaminophen aspirin ibuprofen ketoprofen naproxen This list may not describe all possible interactions. Give your health care provider a list of all the medicines, herbs, non-prescription drugs, or dietary supplements you use. Also tell them if you smoke, drink alcohol, or use illegaldrugs. Some items may interact with your medicine. What should I watch for  while using this medication? Visit your doctor for checks on your progress. This drug may make you feel generally unwell. This is not uncommon, as chemotherapy can affect healthy cells as well as cancer cells. Report any side effects. Continue your course oftreatment even though you feel ill unless your doctor tells you to stop. In some cases, you may be given additional medicines to help with side effects.Follow all directions for their use. Call your doctor or health care professional for advice if you get a fever, chills or sore throat, or other symptoms of a cold or flu. Do not treat yourself. This drug decreases your body's ability to fight infections. Try toavoid being around people who are sick. This medicine may increase your risk to bruise or bleed. Call your doctor orhealth care professional if you notice any unusual bleeding. Be careful brushing and flossing your teeth or using a toothpick because you may get an infection or bleed more easily. If you have any dental work done,tell your dentist you are receiving this medicine. Avoid taking products that contain aspirin, acetaminophen, ibuprofen, naproxen, or ketoprofen unless instructed by your doctor. These medicines may hide afever. Do not become pregnant while taking this medicine or for 6 months after stopping it. Women should inform their doctor if they wish to become pregnant or think they might be pregnant. Men should not father a child while taking this medicine and for 3 months after stopping it. There is a potential for serious side effects to an unborn child. Talk to your health care professional or pharmacist for more information. Do not breast-feed an infant while takingthis medicine or for at least 1 week after stopping it. Men should inform their doctors if they wish to father a child. This medicine may lower sperm counts. Talk with your doctor or health care professional ifyou are concerned about your fertility. What side effects  may I notice from receiving this medication? Side effects that you should report to your doctor or health care professionalas soon as possible: allergic reactions like skin rash, itching or hives, swelling of the face, lips, or tongue breathing problems pain, redness, or irritation at site where injected signs and symptoms of a dangerous change in heartbeat or heart rhythm like chest pain; dizziness; fast or irregular heartbeat; palpitations; feeling faint or lightheaded, falls; breathing problems signs of decreased platelets or bleeding - bruising, pinpoint red spots on the skin, black, tarry stools, blood in the urine signs of decreased red blood cells - unusually weak or tired, feeling faint or lightheaded, falls signs of infection - fever or chills, cough, sore throat, pain or difficulty passing urine signs and symptoms of kidney injury like trouble passing urine or change in the amount of urine signs and symptoms of liver injury like dark yellow or brown urine; general ill feeling or flu-like symptoms; light-colored stools; loss of appetite; nausea; right upper belly pain; unusually weak or tired; yellowing of the eyes or skin swelling of ankles, feet, hands Side effects that usually do not require medical attention (  report to yourdoctor or health care professional if they continue or are bothersome): constipation diarrhea hair loss loss of appetite nausea rash vomiting This list may not describe all possible side effects. Call your doctor for medical advice about side effects. You may report side effects to FDA at1-800-FDA-1088. Where should I keep my medication? This drug is given in a hospital or clinic and will not be stored at home. NOTE: This sheet is a summary. It may not cover all possible information. If you have questions about this medicine, talk to your doctor, pharmacist, orhealth care provider.  2022 Elsevier/Gold Standard (2017-12-14 18:06:11)

## 2021-05-22 ENCOUNTER — Telehealth: Payer: Self-pay

## 2021-05-22 NOTE — Telephone Encounter (Signed)
Pt. Called stating he wanted to know if you could call in the covid medication for him. He is currently doing chemo therapy and was exposed to covid by his son on Monday and Tuesday was around him. His son tested positive for covid on Wednesday. He said he has another chemo treatment on next Thursday and does not want to miss it. He stated he has a slight ST but not has no other symptoms. He would like the preventative medication called in for him for covid because of his current health condition.

## 2021-05-25 ENCOUNTER — Encounter (HOSPITAL_COMMUNITY): Payer: Self-pay

## 2021-05-25 ENCOUNTER — Other Ambulatory Visit: Payer: Self-pay

## 2021-05-25 ENCOUNTER — Telehealth: Payer: Self-pay | Admitting: Medical

## 2021-05-25 ENCOUNTER — Emergency Department (HOSPITAL_COMMUNITY)
Admission: EM | Admit: 2021-05-25 | Discharge: 2021-05-26 | Disposition: A | Discharge status: Home / Self Care | Payer: PPO | Attending: Emergency Medicine | Admitting: Emergency Medicine

## 2021-05-25 DIAGNOSIS — I4891 Unspecified atrial fibrillation: Secondary | ICD-10-CM | POA: Diagnosis not present

## 2021-05-25 DIAGNOSIS — Z7901 Long term (current) use of anticoagulants: Secondary | ICD-10-CM | POA: Diagnosis not present

## 2021-05-25 DIAGNOSIS — I10 Essential (primary) hypertension: Secondary | ICD-10-CM | POA: Diagnosis not present

## 2021-05-25 DIAGNOSIS — F17210 Nicotine dependence, cigarettes, uncomplicated: Secondary | ICD-10-CM | POA: Insufficient documentation

## 2021-05-25 DIAGNOSIS — N179 Acute kidney failure, unspecified: Secondary | ICD-10-CM | POA: Diagnosis not present

## 2021-05-25 DIAGNOSIS — Z79899 Other long term (current) drug therapy: Secondary | ICD-10-CM | POA: Diagnosis not present

## 2021-05-25 DIAGNOSIS — R55 Syncope and collapse: Secondary | ICD-10-CM | POA: Diagnosis not present

## 2021-05-25 DIAGNOSIS — R531 Weakness: Secondary | ICD-10-CM | POA: Diagnosis not present

## 2021-05-25 DIAGNOSIS — I959 Hypotension, unspecified: Secondary | ICD-10-CM | POA: Diagnosis not present

## 2021-05-25 DIAGNOSIS — E860 Dehydration: Secondary | ICD-10-CM

## 2021-05-25 DIAGNOSIS — W19XXXA Unspecified fall, initial encounter: Secondary | ICD-10-CM | POA: Diagnosis not present

## 2021-05-25 NOTE — Telephone Encounter (Signed)
Pts son Harrell Gave called about the results of the MRI. Stated that he saw them on mychart and would like to be notified when you look at them because he has questions. He can be reached at [phone]

## 2021-05-25 NOTE — ED Provider Notes (Signed)
Emergency Medicine Provider Triage Evaluation Note  KHADER BURGY , a 75 y.o. male  was evaluated in triage.  Pt complains of fall today and yesterday. Pt states that he was walking yesterday and today when he got lighheaded and nauseous and fell into a chair, no vertigo. No pain. No CP or Sob. Did not feel like he was going to pass out. Did not hit head. States that he did not hurt himself when he fell. Has pancreatic cancer, on chemo. Has been complaint with Eliquis. Has had chronic arthralgias.   Review of Systems  Positive: Falls Negative: above  Physical Exam  BP 108/64 (BP Location: Left Arm)   Pulse 83   Temp 98.4 F (36.9 C) (Oral)   Resp 16   SpO2 100%  Gen:   Awake, no distress   Resp:  Normal effort  MSK:   Moves extremities without difficulty  Other:    Medical Decision Making  Medically screening exam initiated at 11:00 PM.  Appropriate orders placed.  ESTEVE YAFFE was informed that the remainder of the evaluation will be completed by another provider, this initial triage assessment does not replace that evaluation, and the importance of remaining in the ED until their evaluation is complete.     Alfredia Client, PA-C 05/25/21 [id number]    Luna Fuse, MD 05/26/21 [phone]

## 2021-05-25 NOTE — ED Triage Notes (Signed)
Pt BIB EMS, pt reports falling (controlled falls) today and yesterday. Hx of CA. Pt is on a blood thinner, did not hit his head. Pt complains of leg pain, weakness, and lower back pain.    Vitals 108/58 102 hr  16 R 98% room air 148 CBG

## 2021-05-25 NOTE — Telephone Encounter (Signed)
I spoke to [facility] Friday 05/22/21.   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  No new obvious metastases.   I communicated with you on Friday about this.   This is a reminder/FYI

## 2021-05-26 ENCOUNTER — Encounter: Payer: Self-pay | Admitting: Family Medicine

## 2021-05-26 LAB — COMPREHENSIVE METABOLIC PANEL
ALT: 84 U/L — ABNORMAL HIGH (ref 0–44)
ALT: 92 U/L — ABNORMAL HIGH (ref 0–44)
AST: 72 U/L — ABNORMAL HIGH (ref 15–41)
AST: 85 U/L — ABNORMAL HIGH (ref 15–41)
Albumin: 3.6 g/dL (ref 3.5–5.0)
Albumin: 3.6 g/dL (ref 3.5–5.0)
Alkaline Phosphatase: 124 U/L (ref 38–126)
Alkaline Phosphatase: 133 U/L — ABNORMAL HIGH (ref 38–126)
Anion gap: 10 (ref 5–15)
Anion gap: 11 (ref 5–15)
BUN: 61 mg/dL — ABNORMAL HIGH (ref 8–23)
BUN: 66 mg/dL — ABNORMAL HIGH (ref 8–23)
CO2: 21 mmol/L — ABNORMAL LOW (ref 22–32)
CO2: 20 mmol/L — ABNORMAL LOW (ref 22–32)
Calcium: 8.6 mg/dL — ABNORMAL LOW (ref 8.9–10.3)
Calcium: 8.9 mg/dL (ref 8.9–10.3)
Chloride: 105 mmol/L (ref 98–111)
Chloride: 105 mmol/L (ref 98–111)
Creatinine, Ser: 2.06 mg/dL — ABNORMAL HIGH (ref 0.61–1.24)
Creatinine, Ser: 2.3 mg/dL — ABNORMAL HIGH (ref 0.61–1.24)
GFR, Estimated: 33 mL/min — ABNORMAL LOW (ref >60)
GFR, Estimated: 29 mL/min — ABNORMAL LOW (ref >60)
Glucose, Bld: 140 mg/dL — ABNORMAL HIGH (ref 70–99)
Glucose, Bld: 137 mg/dL — ABNORMAL HIGH (ref 70–99)
Potassium: 3.6 mmol/L (ref 3.5–5.1)
Potassium: 3.7 mmol/L (ref 3.5–5.1)
Sodium: 136 mmol/L (ref 135–145)
Sodium: 136 mmol/L (ref 135–145)
Total Bilirubin: 1.7 mg/dL — ABNORMAL HIGH (ref 0.3–1.2)
Total Bilirubin: 1.3 mg/dL — ABNORMAL HIGH (ref 0.3–1.2)
Total Protein: 7.1 g/dL (ref 6.5–8.1)
Total Protein: 7.2 g/dL (ref 6.5–8.1)

## 2021-05-26 LAB — CBC WITH DIFFERENTIAL/PLATELET
Abs Immature Granulocytes: 0.06 10*3/uL (ref 0.00–0.07)
Basophils Absolute: 0 10*3/uL (ref 0.0–0.1)
Basophils Relative: 0 %
Eosinophils Absolute: 0.1 10*3/uL (ref 0.0–0.5)
Eosinophils Relative: 1 %
HCT: 31.5 % — ABNORMAL LOW (ref 39.0–52.0)
Hemoglobin: 10.5 g/dL — ABNORMAL LOW (ref 13.0–17.0)
Immature Granulocytes: 1 %
Lymphocytes Relative: 4 %
Lymphs Abs: 0.4 10*3/uL — ABNORMAL LOW (ref 0.7–4.0)
MCH: 32.6 pg (ref 26.0–34.0)
MCHC: 33.3 g/dL (ref 30.0–36.0)
MCV: 97.8 fL (ref 80.0–100.0)
Monocytes Absolute: 0.1 10*3/uL (ref 0.1–1.0)
Monocytes Relative: 1 %
Neutro Abs: 9.1 10*3/uL — ABNORMAL HIGH (ref 1.7–7.7)
Neutrophils Relative %: 93 %
Platelets: 91 10*3/uL — ABNORMAL LOW (ref 150–400)
RBC: 3.22 MIL/uL — ABNORMAL LOW (ref 4.22–5.81)
RDW: 15.3 % (ref 11.5–15.5)
WBC: 9.8 10*3/uL (ref 4.0–10.5)
nRBC: 0 % (ref 0.0–0.2)

## 2021-05-26 LAB — URINALYSIS, ROUTINE W REFLEX MICROSCOPIC
Bilirubin Urine: NEGATIVE
Glucose, UA: NEGATIVE mg/dL
Hgb urine dipstick: NEGATIVE
Ketones, ur: NEGATIVE mg/dL
Leukocytes,Ua: NEGATIVE
Nitrite: NEGATIVE
Protein, ur: 100 mg/dL — AB
Specific Gravity, Urine: 1.016 (ref 1.005–1.030)
pH: 5 (ref 5.0–8.0)

## 2021-05-26 LAB — CBC
HCT: 33 % — ABNORMAL LOW (ref 39.0–52.0)
Hemoglobin: 11 g/dL — ABNORMAL LOW (ref 13.0–17.0)
MCH: 32.9 pg (ref 26.0–34.0)
MCHC: 33.3 g/dL (ref 30.0–36.0)
MCV: 98.8 fL (ref 80.0–100.0)
Platelets: 97 10*3/uL — ABNORMAL LOW (ref 150–400)
RBC: 3.34 MIL/uL — ABNORMAL LOW (ref 4.22–5.81)
RDW: 15.2 % (ref 11.5–15.5)
WBC: 8.5 10*3/uL (ref 4.0–10.5)
nRBC: 0 % (ref 0.0–0.2)

## 2021-05-26 LAB — TSH: TSH: 0.22 u[IU]/mL — ABNORMAL LOW (ref 0.350–4.500)

## 2021-05-26 LAB — MAGNESIUM: Magnesium: 2.5 mg/dL — ABNORMAL HIGH (ref 1.7–2.4)

## 2021-05-26 LAB — LACTIC ACID, PLASMA: Lactic Acid, Venous: 1.3 mmol/L (ref 0.5–1.9)

## 2021-05-26 MED ORDER — SODIUM CHLORIDE 0.9 % IV BOLUS
1000.0000 mL | Freq: Once | INTRAVENOUS | Status: AC
  Administered 2021-05-26: 1000 mL via INTRAVENOUS

## 2021-05-26 NOTE — ED Notes (Signed)
Pt states he is unable to urinate for specimen collection. States last void was during the night while in the waiting room. No PO intake while waiting for room.

## 2021-05-26 NOTE — ED Notes (Signed)
Pt refused EKG, states he has been here since 11am and is ready to go home.

## 2021-05-26 NOTE — ED Provider Notes (Signed)
[facility] DEPT Provider Note   CSN: [id number] Arrival date & time: 05/25/21  1954     History Chief Complaint  Patient presents with   Fall   Leg Pain   Back Pain   Weakness    Lee Kim is a 75 y.o. male.  The history is provided by the patient and the spouse. No language interpreter was used.  Leg Pain Associated symptoms: back pain and fatigue   Associated symptoms: no fever and no neck pain   Back Pain Associated symptoms: leg pain   Associated symptoms: no chest pain, no dysuria, no fever, no headaches, no numbness and no weakness   Weakness Associated symptoms: near-syncope   Associated symptoms: no chest pain, no cough, no diarrhea, no dizziness, no dysuria, no fever, no headaches, no nausea, no seizures, no shortness of breath and no vomiting   Near Syncope This is a new problem. The current episode started 12 to 24 hours ago. The problem occurs rarely. The problem has not changed since onset.Pertinent negatives include no chest pain, no headaches and no shortness of breath. Nothing aggravates the symptoms. Nothing relieves the symptoms. He has tried nothing for the symptoms. The treatment provided no relief.      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[place]) 05/07/2021   Homonymous bilateral field defects 04/30/2021   Atherosclerosis of coronary artery of native heart without angina pectoris 04/08/2021   Mediastinal adenopathy 04/08/2021   Intermediate stage nonexudative age-related macular degeneration of both eyes 12/31/2020   Branch retinal vein occlusion, right eye 11/19/2020   Branch retinal vein occlusion, left eye 11/19/2020   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   History  of retinal detachment 11/19/2020   Erectile dysfunction 01/23/2019   Multinodular goiter 01/23/2019   Atherosclerosis of aorta ([place]) 01/23/2019   Ascending aortic aneurysm ([place]) 01/23/2019   Glucose intolerance (impaired glucose tolerance) 08/24/2016   Chronic allergic rhinitis 08/11/2016   Alcohol use disorder, mild, abuse 08/11/2016   History of colonic polyps 08/11/2016   Unifocal PVCs 08/11/2016   Atrial fibrillation ([place]) 08/11/2016   OSA on CPAP 04/27/2010   Obesity 04/24/2010   ERYTHROCYTOSIS 04/24/2010   Essential hypertension 04/24/2010    Past Surgical History:  Procedure Laterality Date   COLONOSCOPY  03/2003   Dr. Wynetta Emery   COLONOSCOPY WITH PROPOFOL N/A 11/23/2016   Procedure: COLONOSCOPY WITH PROPOFOL;  Surgeon: Garlan Fair, MD;  Location: [facility];  Service: Endoscopy;  Laterality: N/A;   EYE SURGERY     IR IMAGING GUIDED PORT INSERTION  05/15/2021       Family History  Problem Relation Age of Onset   Hypertension Mother    Hypertension Father    Hypertension Sister     Social History   Tobacco Use   Smoking status: Every Day    Types: Cigarettes   Smokeless tobacco: Never  Vaping Use   Vaping Use: Never used  Substance Use Topics   Alcohol use: Yes    Alcohol/week: 7.0 standard drinks    Types: 7 Glasses of wine per week    Comment: 3-4 glasses wine per night   Drug use: No    Home Medications Prior  to Admission medications   Medication Sig Start Date End Date Taking? Authorizing Provider  amLODipine (NORVASC) 10 MG tablet Take 1 tablet (10 mg total) by mouth daily. 06/11/20   [name], MD  cromolyn (NASALCROM) 5.2 MG/ACT nasal spray Place 1 spray into both nostrils 2 (two) times daily as needed for allergies.    [provider]  ELIQUIS 5 MG TABS tablet TAKE 1 TABLET(5 MG) BY MOUTH TWICE DAILY Patient taking differently: Take 5 mg by mouth 2 (two) times daily. 03/20/21   [name], MD  lidocaine-prilocaine (EMLA)  cream Apply 1 application topically as needed. 05/07/21   Orson Slick, MD  lisinopril-hydrochlorothiazide (ZESTORETIC) 10-12.5 MG tablet TAKE 1 TABLET BY MOUTH DAILY 06/11/20   [name], MD  Multiple Vitamins-Minerals (PRESERVISION AREDS 2 PO) Take 1 tablet by mouth 2 (two) times daily.     [provider]  ondansetron (ZOFRAN) 8 MG tablet Take 1 tablet (8 mg total) by mouth every 8 (eight) hours as needed for nausea or vomiting. 05/07/21   Orson Slick, MD  prochlorperazine (COMPAZINE) 10 MG tablet Take 1 tablet (10 mg total) by mouth every 6 (six) hours as needed for nausea or vomiting. 05/07/21   Orson Slick, MD  rosuvastatin (CRESTOR) 40 MG tablet Take 40 mg by mouth daily.    [provider]  tadalafil (CIALIS) 20 MG tablet TAKE 1 TABLET BY MOUTH EVERY DAY AS NEEDED Patient taking differently: Take 10 mg by mouth daily as needed for erectile dysfunction. 06/11/20   [name], MD  TRAVATAN Z 0.004 % SOLN ophthalmic solution Place 1 drop into the right eye at bedtime. [place] 07/08/16   [provider]  triamcinolone (KENALOG) 0.1 % APPLY EXTERNALLY TO THE AFFECTED AREA TWICE DAILY Patient taking differently: Apply 1 application topically daily as needed (psoriasis). 10/14/20   [name], MD    Allergies    Benzalkonium chloride  Review of Systems   Review of Systems  Constitutional:  Positive for fatigue. Negative for chills, diaphoresis and fever.  HENT:  Negative for congestion.   Eyes:  Negative for visual disturbance.  Respiratory:  Negative for cough, chest tightness, shortness of breath and wheezing.   Cardiovascular:  Positive for near-syncope. Negative for chest pain, palpitations and leg swelling.  Gastrointestinal:  Positive for constipation. Negative for diarrhea, nausea and vomiting.  Genitourinary:  Negative for dysuria and flank pain.  Musculoskeletal:  Positive for back pain. Negative for neck pain and neck stiffness.   Skin:  Negative for rash and wound.  Neurological:  Positive for light-headedness. Negative for dizziness, seizures, syncope (naer syncope present), facial asymmetry, speech difficulty, weakness, numbness and headaches.  Psychiatric/Behavioral:  Positive for confusion (per wife mild). Negative for agitation.   All other systems reviewed and are negative.  Physical Exam Updated Vital Signs BP 104/71 (BP Location: Left Arm)   Pulse (!) 53   Temp 97.6 F (36.4 C) (Oral)   Resp 15   SpO2 100%   Physical Exam Vitals and nursing note reviewed.  Constitutional:      General: He is not in acute distress.    Appearance: He is well-developed. He is not ill-appearing or diaphoretic.  HENT:     Head: Normocephalic and atraumatic.     Nose: Nose normal.     Mouth/Throat:     Mouth: Mucous membranes are dry.     Pharynx: No oropharyngeal exudate or posterior oropharyngeal  erythema.  Eyes:     Extraocular Movements: Extraocular movements intact.     Conjunctiva/sclera: Conjunctivae normal.     Pupils: Pupils are equal, round, and reactive to light.  Cardiovascular:     Rate and Rhythm: Normal rate and regular rhythm.     Heart sounds: No murmur heard. Pulmonary:     Effort: Pulmonary effort is normal. No respiratory distress.     Breath sounds: Normal breath sounds. No wheezing, rhonchi or rales.  Chest:     Chest wall: No tenderness.  Abdominal:     General: Abdomen is flat.     Palpations: Abdomen is soft.     Tenderness: There is no abdominal tenderness. There is no guarding or rebound.  Musculoskeletal:        General: No tenderness.     Cervical back: Neck supple. No tenderness.     Right lower leg: No edema.     Left lower leg: No edema.  Skin:    General: Skin is warm and dry.     Capillary Refill: Capillary refill takes less than 2 seconds.     Findings: No erythema.  Neurological:     General: No focal deficit present.     Mental Status: He is alert and oriented to  person, place, and time.     Sensory: No sensory deficit.     Motor: No weakness.     Coordination: Coordination normal.  Psychiatric:        Mood and Affect: Mood normal.    ED Results / Procedures / Treatments   Labs (all labs ordered are listed, but only abnormal results are displayed) Labs Reviewed  COMPREHENSIVE METABOLIC PANEL - Abnormal; Notable for the following components:      Result Value   CO2 21 (*)    Glucose, Bld 140 (*)    BUN 61 (*)    Creatinine, Ser 2.06 (*)    Calcium 8.6 (*)    AST 72 (*)    ALT 84 (*)    Total Bilirubin 1.7 (*)    GFR, Estimated 33 (*)    All other components within normal limits  CBC WITH DIFFERENTIAL/PLATELET - Abnormal; Notable for the following components:   RBC 3.22 (*)    Hemoglobin 10.5 (*)    HCT 31.5 (*)    Platelets 91 (*)    Neutro Abs 9.1 (*)    Lymphs Abs 0.4 (*)    All other components within normal limits  URINALYSIS, ROUTINE W REFLEX MICROSCOPIC - Abnormal; Notable for the following components:   Color, Urine AMBER (*)    APPearance HAZY (*)    Protein, ur 100 (*)    Bacteria, UA RARE (*)    All other components within normal limits  TSH - Abnormal; Notable for the following components:   TSH 0.220 (*)    All other components within normal limits  COMPREHENSIVE METABOLIC PANEL - Abnormal; Notable for the following components:   CO2 20 (*)    Glucose, Bld 137 (*)    BUN 66 (*)    Creatinine, Ser 2.30 (*)    AST 85 (*)    ALT 92 (*)    Alkaline Phosphatase 133 (*)    Total Bilirubin 1.3 (*)    GFR, Estimated 29 (*)    All other components within normal limits  MAGNESIUM - Abnormal; Notable for the following components:   Magnesium 2.5 (*)    All other components within normal limits  CBC - Abnormal; Notable for the following components:   RBC 3.34 (*)    Hemoglobin 11.0 (*)    HCT 33.0 (*)    Platelets 97 (*)    All other components within normal limits  LACTIC ACID, PLASMA    EKG EKG  Interpretation  Date/Time:  Tuesday May 26 2021 07:33:41 EDT Ventricular Rate:  91 PR Interval:    QRS Duration: 154 QT Interval:  401 QTC Calculation: 494 R Axis:   -79 Text Interpretation: Atrial fibrillation RBBB and LAFB Baseline wander in lead(s) V2 When compared to prior, now afib. wandering baseline. No STEMI Confirmed by Antony Blackbird [phone]) on 05/26/2021 7:52:14 AM  Radiology No results found.  Procedures Procedures   Medications Ordered in ED Medications  sodium chloride 0.9 % bolus 1,000 mL (0 mLs Intravenous Stopped 05/26/21 0849)    ED Course  I have reviewed the triage vital signs and the nursing notes.  Pertinent labs & imaging results that were available during my care of the patient were reviewed by me and considered in my medical decision making (see chart for details).    MDM Rules/Calculators/A&P                           Lee Kim is a 75 y.o. male with a past medical history significant for prior stroke, atrial fibrillation on Eliquis therapy, pancreatic cancer on infusion treatments most recently last Thursday, known ascending aortic aneurysm, diverticulitis, hypertension, and sleep apnea who presents with near syncope.  According to patient and wife, over the last 24 hours, he has had 2 episodes of near syncope where he got very lightheaded and had to go to the ground.  He denies a fall or trauma and denies any associated neurologic deficits with it.  He denied palpitations, chest pain, shortness of breath.  He denied vomiting reports some nausea when he was getting lightheaded.  He reports no numbness, tingling, weakness of extremities.  Denies any headache, neck pain, neck stiffness.  Denies any new vision changes from his recent changes from the strokes.  Denies any dysuria, increase or decrease in urination, foul smell, or other change with urine.  Denies any diarrhea but does report some constipation.  He thinks he has been drinking fairly  well.  He denies any fevers, chills, congestion, cough, or rhinorrhea.  On exam, mucous membranes are dry and mouth is pale.  Lungs were clear and chest and abdomen were nontender.  Back was nontender.  Patient moving all extremities.  Normal sensation and strength in extremities.  Good pulses in extremities.  Symmetric smile.  Clear speech.  Pupils symmetric reactive normal extraocular movements.  Based on his description of symptoms and exam I am concerned he may have had a near syncopal episode related to some dehydration.  He had some blood work done upon arrival to the emergency department which unfortunately was around 11-1/2 hours ago.  Had a shared decision made conversation with patient and family and we agreed to get some repeat labs as patient's first labs do reveal evidence of an acute kidney injury.  We will give some fluids and repeat labs.  We will check orthostatics.  Given his lack of any new neurologic complaints, headache, or description of dizziness, family and patient agree on holding on imaging including no head imaging or chest x-ray at this time with lack of any respiratory symptoms.  We will get a urinalysis and repeat blood  work and hydration.  Anticipate shared decision-making conversation about disposition after work-up is completed.  12:53 PM Initial lab testing show AKI compared to prior likely due to dehydration.  TSH slightly low.  Urinalysis did not show evidence of infection.  No evidence of leukocytosis and mild anemia present.  Platelets are low likely related to his infusion therapy.  As he has been here for many hours, repeat blood work was obtained and his creatinine did slightly rise to 2.3.  He received fluids after this was drawn.  Had a shared decision-making conversation with patient and family.  We offered admission given the worsening AKI and dehydration and fatigue and near syncopal episodes however, he is feeling slightly better after some fluids and is  able to tolerate p.o.  His vital signs are reassuring on my reassessment as he is not hypotensive, tachycardic, or febrile.  He is on room air.  He is denying any chest pain or palpitations and is feeling better after some fluids.  Orthostatics were reevaluated for fluids and were negative.  Patient will follow-up with his team tomorrow as he has an appointment and will call to get repeat blood work with him to monitor his kidney function.  He will increase his hydration status.  He understands any worsened lightheadedness fatigue or near syncope he needs to return for admission for rehydration and further monitoring of his AKI.  He agrees and has no other questions or concerns.  Patient discharged in good condition with improving symptoms.   Final Clinical Impression(s) / ED Diagnoses Final diagnoses:  Near syncope  Dehydration  AKI (acute kidney injury) ([place])    Rx / DC Orders ED Discharge Orders     None       Clinical Impression: 1. Near syncope   2. Dehydration   3. AKI (acute kidney injury) ([place])     Disposition: Discharge  Condition: Good  I have discussed the results, Dx and Tx plan with the pt(& family if present). He/she/they expressed understanding and agree(s) with the plan. Discharge instructions discussed at great length. Strict return precautions discussed and pt &/or family have verbalized understanding of the instructions. No further questions at time of discharge.    New Prescriptions   No medications on file    Follow Up: [name], [place] [id number] [place] DEPT [place] [id number] [place]       Danaiya Steadman, Gwenyth Allegra, MD 05/26/21 1257

## 2021-05-26 NOTE — Discharge Instructions (Addendum)
Your history, exam, work-up today are consistent with dehydration and increased kidney function likely related to this.  This likely led to your near syncope or lightheadedness/near passing out episodes.  Your work-up otherwise was overall similar to prior.  We had a shared decision-making conversation offering admission for rehydration and further renal monitoring however, as you are able to tolerate eating and drinking and your vital signs are reassuring and your orthostatic testing was negative, we felt it was reasonable after our conversation to let you go home to follow-up with your outpatient team tomorrow.  Please discuss with them repeat kidney testing and follow-up otherwise.  Please rest and stay hydrated.  If any symptoms change or worsen or return, please return to the nearest emergency department.

## 2021-05-27 ENCOUNTER — Ambulatory Visit (INDEPENDENT_AMBULATORY_CARE_PROVIDER_SITE_OTHER): Payer: PPO | Admitting: Family Medicine

## 2021-05-27 ENCOUNTER — Other Ambulatory Visit: Payer: Self-pay

## 2021-05-27 VITALS — HR 61 | Temp 96.0°F | Wt 244.6 lb

## 2021-05-27 DIAGNOSIS — C259 Malignant neoplasm of pancreas, unspecified: Secondary | ICD-10-CM | POA: Diagnosis not present

## 2021-05-27 DIAGNOSIS — C772 Secondary and unspecified malignant neoplasm of intra-abdominal lymph nodes: Secondary | ICD-10-CM

## 2021-05-27 DIAGNOSIS — R7302 Impaired glucose tolerance (oral): Secondary | ICD-10-CM | POA: Diagnosis not present

## 2021-05-27 DIAGNOSIS — R531 Weakness: Secondary | ICD-10-CM | POA: Diagnosis not present

## 2021-05-27 LAB — POCT GLYCOSYLATED HEMOGLOBIN (HGB A1C): Hemoglobin A1C: 4.8 % (ref 4.0–5.6)

## 2021-05-27 NOTE — Progress Notes (Deleted)
  Subjective:    Patient ID: Lee Kim, male    DOB: 06/21/1946, 75 y.o.   MRN: [id number]  Lee Kim is a 75 y.o. male who presents for follow-up of Type 2 diabetes mellitus.  Home blood sugar records: {diabetes glucometry results:16657} Current symptoms/problems include {Symptoms; diabetes:14075} and have been {Desc; course:15616}. Daily foot checks:   Any foot concerns: *** Exercise: {types:19826} Diet: The following portions of the patient's history were reviewed and updated as appropriate: allergies, current medications, past medical history, past social history and problem list.  ROS as in subjective above.     Objective:    Physical Exam Alert and in no distress otherwise not examined.  There were no vitals taken for this visit.  Lab Review Diabetic Labs Latest Ref Rng & Units 05/26/2021 05/25/2021 05/21/2021 05/07/2021 04/08/2021  HbA1c 4.0 - 5.6 % - - - - -  Chol 100 - 199 mg/dL - - - - -  HDL >39 mg/dL - - - - -  Calc LDL 0 - 99 mg/dL - - - - -  Triglycerides 0 - 149 mg/dL - - - - -  Creatinine 0.61 - 1.24 mg/dL 2.30(H) 2.06(H) 1.29(H) 1.70(H) 1.26   BP/Weight 05/26/2021 05/21/2021 05/15/2021 05/07/2021 [id number]  Systolic BP [id number] [id number]  Diastolic BP 85 62 73 80 95  Wt. (Lbs) - 248 - 252.06 259.7  BMI - 32.72 - 33.26 34.26   No flowsheet data found.  Delancey  reports that he has been smoking cigarettes. He has never used smokeless tobacco. He reports current alcohol use of about 7.0 standard drinks per week. He reports that he does not use drugs.     Assessment & Plan:    No diagnosis found.  Rx changes: {none:33079} Education: Reviewed 'ABCs' of diabetes management (respective goals in parentheses):  A1C (<7), blood pressure (<130/80), and cholesterol (LDL <100). Compliance at present is estimated to be {good/fair/poor:33178}. Efforts to improve compliance (if necessary) will be directed at {compliance:16716}. Follow up: {NUMBERS; 0-10:33138}  {time:11}

## 2021-05-27 NOTE — Progress Notes (Signed)
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 Found to have enlargement of left supraclavicular lymph node with diffuse mediastinal and upper abdominal lymph nodes. 05/01/2021: [place] core biopsy of left supraclavicular lymph node. Findings consistent with metastatic adenocarcinoma. Lung vs pancreatic origin 05/06/2021: CT abdomen performed. Formal read pending. 05/07/2021: establish care with Dr. Lorenso Courier   CHIEF COMPLAINTS/PURPOSE OF CONSULTATION:  Metastatic pancreatic cancer  HISTORY OF PRESENTING ILLNESS:  Lee Kim 75 y.o. male with medical history significant for HTN, obesity, OSA, diverticulitis, and erythrocytosis who presents for evaluation of newly diagnosed pancreatic adenocarcinoma.   On review of the previous records Lee Kim underwent a routine CT angio of the chest for monitoring of his thoracic aortic aneurysm was found to have an enlargement of the left supraclavicular lymph node with diffuse mediastinal and upper abdominal lymph nodes.  On 05/01/2021 he underwent a [place] core biopsy of this left supraclavicular lymph node.  Pathology results were consistent with metastatic adenocarcinoma, the primary which is either lung versus pancreatic origin.  The patient underwent a CT scan of his abdomen on 05/06/2021.  Due to concern for this metastatic adenocarcinoma the patient was referred to oncology for further evaluation and management.  On exam today Lee Kim is unaccompanied.  He notes that this  all began when he was undergoing a routine CT scan with his cardiologist.  He notes that he has not been having any symptoms such as weight loss, nausea, vomiting, or diarrhea.  He also denies any pain or shortness of breath.  He notes that his energy has been "so-so".  Overall he has been in his normal state of health and feels "fine".  On further discussion he notes that he previously smoked about 2 cigarettes per night but "quit a few years ago".  He notes he would drink a few glasses of wine with dinner every night but quit this during the pandemic.  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  His family history is remarkable for breast cancer in his mother and heart disease in his father.    INTERVAL HISTORY  Patient was seen in followup for Dr Lorenso Courier. He is here to start C1D1 of Gemcitabine/Abraxane chemotherapy. Has completed chemo-counseling. Notes some upper abdominal fullness but no other acute new symptoms. Labs reviewed with labs today.   MEDICAL HISTORY:  Past Medical History:  Diagnosis Date   Colonic polyp    Diverticulitis    Erythrocytosis    Hypertension    Obesity    OSA (obstructive sleep apnea)     SURGICAL HISTORY: Past Surgical History:  Procedure Laterality Date   COLONOSCOPY  03/2003   Dr. Wynetta Emery   COLONOSCOPY WITH PROPOFOL N/A 11/23/2016   Procedure: COLONOSCOPY WITH PROPOFOL;  Surgeon: Garlan Fair, MD;  Location: [facility];  Service: Endoscopy;  Laterality: N/A;   EYE SURGERY     IR IMAGING GUIDED PORT INSERTION  05/15/2021    SOCIAL HISTORY: Social History   Socioeconomic History   Marital status: Married    Spouse name: Not on file   Number of children: 1  Years of education: Not on file   Highest education level: Not on file  Occupational History   Not on file  Tobacco Use   Smoking status: Every Day    Types: Cigarettes   Smokeless tobacco: Never  Vaping Use   Vaping Use: Never used  Substance and Sexual Activity   Alcohol use: Yes     Alcohol/week: 7.0 standard drinks    Types: 7 Glasses of wine per week    Comment: 3-4 glasses wine per night   Drug use: No   Sexual activity: Yes  Other Topics Concern   Not on file  Social History Narrative   Not on file   Social Determinants of Health   Financial Resource Strain: Not on file  Food Insecurity: Not on file  Transportation Needs: Not on file  Physical Activity: Not on file  Stress: Not on file  Social Connections: Not on file  Intimate Partner Violence: Not on file    FAMILY HISTORY: Family History  Problem Relation Age of Onset   Hypertension Mother    Hypertension Father    Hypertension Sister     ALLERGIES:  is allergic to benzalkonium chloride.  MEDICATIONS:  Current Outpatient Medications  Medication Sig Dispense Refill   amLODipine (NORVASC) 10 MG tablet Take 1 tablet (10 mg total) by mouth daily. 90 tablet 3   cromolyn (NASALCROM) 5.2 MG/ACT nasal spray Place 1 spray into both nostrils 2 (two) times daily as needed for allergies.     ELIQUIS 5 MG TABS tablet TAKE 1 TABLET(5 MG) BY MOUTH TWICE DAILY (Patient taking differently: Take 5 mg by mouth 2 (two) times daily.) 180 tablet 0   Gemcitabine HCl (GEMZAR IV) Inject 1 Bag into the vein once a week. Pt gets for 30 minutes once per week     lidocaine-prilocaine (EMLA) cream Apply 1 application topically as needed. 30 g 0   lisinopril-hydrochlorothiazide (ZESTORETIC) 10-12.5 MG tablet TAKE 1 TABLET BY MOUTH DAILY 90 tablet 3   Multiple Vitamins-Minerals (PRESERVISION AREDS 2 PO) Take 1 tablet by mouth 2 (two) times daily.      ondansetron (ZOFRAN) 8 MG tablet Take 1 tablet (8 mg total) by mouth every 8 (eight) hours as needed for nausea or vomiting. 30 tablet 0   PACLitaxel Protein-Bound Part (ABRAXANE IV) Inject 1 Bag into the vein once a week. Pt gets for 30 minutes once per week     prochlorperazine (COMPAZINE) 10 MG tablet Take 1 tablet (10 mg total) by mouth every 6 (six) hours as needed for  nausea or vomiting. (Patient not taking: Reported on 05/27/2021) 30 tablet 0   psyllium (METAMUCIL) 58.6 % packet Take 1 packet by mouth daily.     rosuvastatin (CRESTOR) 40 MG tablet Take 40 mg by mouth every evening.     tadalafil (CIALIS) 20 MG tablet TAKE 1 TABLET BY MOUTH EVERY DAY AS NEEDED (Patient taking differently: Take 10 mg by mouth daily as needed for erectile dysfunction.) 30 tablet 5   TRAVATAN Z 0.004 % SOLN ophthalmic solution Place 1 drop into the right eye at bedtime. [place]  12   triamcinolone (KENALOG) 0.1 % APPLY EXTERNALLY TO THE AFFECTED AREA TWICE DAILY (Patient taking differently: Apply 1 application topically daily as needed (psoriasis).) 45 g 5   No current facility-administered medications for this visit.    REVIEW OF SYSTEMS:  .10 Point review of Systems was done is negative except as noted above.   PHYSICAL EXAMINATION: ECOG PERFORMANCE STATUS:  1 - Symptomatic but completely ambulatory  Vitals:   05/21/21 1151  BP: 140/62  Pulse: 74  Resp: 18  Temp: 98.1 F (36.7 C)  SpO2: 100%   Filed Weights   05/21/21 1151  Weight: 248 lb (112.5 kg)   . GENERAL:alert, in no acute distress and comfortable SKIN: no acute rashes, no significant lesions EYES: conjunctiva are pink and non-injected, sclera anicteric OROPHARYNX: MMM, no exudates, no oropharyngeal erythema or ulceration NECK: supple, no JVD LYMPH:  no palpable lymphadenopathy in the axillary or inguinal regions,left supraclavicular LN palpable. LUNGS: clear to auscultation b/l with normal respiratory effort HEART: regular rate & rhythm ABDOMEN:  normoactive bowel sounds , non tender, not distended. Extremity: no pedal edema PSYCH: alert & oriented x 3 with fluent speech NEURO: no focal motor/sensory deficits  LABORATORY DATA:   Component     Latest Ref Rng & Units 05/07/2021 05/21/2021  WBC     4.0 - 10.5 K/uL  6.2  RBC     4.22 - 5.81 MIL/uL  3.45 (L)  Hemoglobin     13.0 - 17.0 g/dL  11.1  (L)  HCT     39.0 - 52.0 %  33.3 (L)  MCV     80.0 - 100.0 fL  96.5  MCH     26.0 - 34.0 pg  32.2  MCHC     30.0 - 36.0 g/dL  33.3  RDW     11.5 - 15.5 %  15.6 (H)  Platelets     150 - 400 K/uL  143 (L)  nRBC     0.0 - 0.2 %  0.0  Neutrophils     %  71  NEUT#     1.7 - 7.7 K/uL  4.5  Lymphocytes     %  13  Lymphocyte #     0.7 - 4.0 K/uL  0.8  Monocytes Relative     %  11  Monocyte #     0.1 - 1.0 K/uL  0.7  Eosinophil     %  3  Eosinophils Absolute     0.0 - 0.5 K/uL  0.2  Basophil     %  1  Basophils Absolute     0.0 - 0.1 K/uL  0.0  Immature Granulocytes     %  1  Abs Immature Granulocytes     0.00 - 0.07 K/uL  0.04  Sodium     135 - 145 mmol/L  141  Potassium     3.5 - 5.1 mmol/L  3.8  Chloride     98 - 111 mmol/L  110  CO2     22 - 32 mmol/L  22  Glucose     70 - 99 mg/dL  103 (H)  BUN     8 - 23 mg/dL  32 (H)  Creatinine     0.61 - 1.24 mg/dL  1.29 (H)  Calcium     8.9 - 10.3 mg/dL  9.2  Total Protein     6.5 - 8.1 g/dL  6.7  Albumin     3.5 - 5.0 g/dL  3.5  AST     15 - 41 U/L  29  ALT     0 - 44 U/L  31  Alkaline Phosphatase     38 - 126 U/L  164 (H)  Total Bilirubin     0.3 - 1.2 mg/dL  1.1  GFR, Est Non African American     >  60 mL/min  59 (L)  Anion gap     5 - 15  9  CA 19-9     0 - 35 U/mL 1,870 (H)   AFP, Serum, Tumor Marker     0.0 - 8.4 ng/mL 2.7   LDH     98 - 192 U/L 342 (H)     Component     Latest Ref Rng & Units 05/07/2021  CEA ([facility])     0.00 - 5.00 ng/mL 75.47 (H)  CA 19-9     0 - 35 U/mL 1,870 (H)  AFP, Serum, Tumor Marker     0.0 - 8.4 ng/mL 2.7  LDH     98 - 192 U/L 342 (H)  Chromogranin A (ng/mL)     0.0 - 101.8 ng/mL 111.7 (H)    PATHOLOGY: SURGICAL PATHOLOGY  CASE: [id number]  PATIENT: Lindell Noe  Surgical Pathology Report   Clinical History: Neck and mediastinal adenopathy (nt)   FINAL MICROSCOPIC DIAGNOSIS:   A. LYMPH NODE, LEFT NECK, NEEDLE CORE BIOPSY:  - Metastatic  adenocarcinoma, see comment.   COMMENT:   Immunohistochemistry is positive for cytokeratin 7, TTF-1 (weak,  patchy), and CDX-2 (weak). NapsinA and cytokeratin 20 are negative. The  immunoprofile is somewhat non-specific with CDX-2 suggesting an upper GI  or pancreaticobiliary primary and TTF-1 possibly indicating a lung  primary. Clinical and radiographic correlation is recommended. Dr.  Martinique has reviewed the case. Dr. Lorenso Courier was notified on 05/06/2021.   GROSS DESCRIPTION:   Received fresh are several cores and irregular pieces of pink-gray to  dark red soft tissue, 0.5 x 0.3 x 0.1 cm in aggregate.  A portion of the  specimen is submitted in Streck media for possible flow cytometry, with  remaining specimen submitted in 1 block for routine histology.   Per Dr. Tresa Moore the tissue initially submitted for Flow is instead  submitted for routine histology in block 2.   SW 05/01/2021   Final Diagnosis performed by Vicente Males, MD.   Electronically signed  05/06/2021  RADIOGRAPHIC STUDIES: I have personally reviewed the radiological images as listed and agreed with the findings in the report: Enlarged lymph nodes of the chest.  Concern for enlarged lymph nodes of the abdomen and possible pancreatic mass.  MR Brain W Wo Contrast  Addendum Date: 05/22/2021   ADDENDUM REPORT: 05/22/2021 09:50 ADDENDUM: These results were called by telephone on 05/22/2021 at 9:50 am to provider Maryellen Pile, PA, who verbally acknowledged these results. Electronically Signed   By: Merilyn Baba M.D.   On: 05/22/2021 09:50   Result Date: 05/22/2021 CLINICAL DATA:  Evaluation for metastatic disease, history of pancreatic cancer. EXAM: MRI HEAD WITHOUT AND WITH CONTRAST TECHNIQUE: Multiplanar, multiecho pulse sequences of the brain and surrounding structures were obtained without and with intravenous contrast. CONTRAST:  52m MULTIHANCE GADOBENATE DIMEGLUMINE 529 MG/ML IV SOLN COMPARISON:  None. FINDINGS:  Brain: Area of encephalomalacia in the right temporal lobe, likely remote infarct. Areas of contrast enhancement within this area (series 22, image 76 and 71) correspond with areas of restricted diffusion, most likely subacute infarcts. Additional cortical enhancement in the lateral left frontal lobe (series 22, images 81-100), which also correlates with areas of restricted diffusion, also favored to represent subacute infarcts. Additional small area of encephalomalacia in the right occipital lobe (series 22, image 61), with adjacent small focus of enhancement (series 22, image 68), which does not have a definite correlate of restricted diffusion. Punctate foci and of  restricted diffusion in the left parietal lobe (series 6, image 76, right parietal lobe (series 9, image 42), and bilateral cerebellar hemispheres (series 9, image 43, without enhancing correlates, may represent more acute infarcts. Multiple punctate infarcts and T2 hyperintense foci throughout the bilateral cerebral white matter, likely the sequela of smaller, remote parenchymal infarcts and chronic small vessel ischemic disease. Lacunar infarcts in the bilateral basal ganglia and left thalamus. Vascular: Normal flow voids. Skull and upper cervical spine: Normal marrow signal. Sinuses/Orbits: Small mucous retention cyst in the right maxillary sinus. Status post right lens replacement. Other: None. IMPRESSION: 1. Large area of encephalomalacia in the right temporal lobe, consistent with remote right MCA territory infarct. Foci of contrast enhancement within this area, which correspond to areas of restricted diffusion, are felt to reflect subacute infarcts rather than enhancing metastatic disease. Attention on follow-up. 2. Area of encephalomalacia in the right occipital lobe, likely result of remote infarct, with adjacent focus of contrast enhancement without definite correlate on restricted diffusion; this enhancing focus may reflect subacute infarct,  but could also represent metastatic disease. Attention on follow-up. 3. Cortical enhancement in the lateral left frontal lobe, which correlates to an area of restricted diffusion, also favored to represent subacute infarct. 4. Additional punctate foci of restricted diffusion in the bilateral parietal lobes and cerebellar hemispheres may represent more acute infarcts. Electronically Signed: By: Merilyn Baba M.D. On: 05/22/2021 09:15   CT Abdomen Pelvis W Contrast  Result Date: 05/07/2021 CLINICAL DATA:  Thoracic lymphadenopathy. Evaluate for primary malignancy. EXAM: CT ABDOMEN AND PELVIS WITH CONTRAST TECHNIQUE: Multidetector CT imaging of the abdomen and pelvis was performed using the standard protocol following bolus administration of intravenous contrast. CONTRAST:  170m ISOVUE-300 IOPAMIDOL (ISOVUE-300) INJECTION 61% COMPARISON:  Chest CTA on 03/31/2021 FINDINGS: Lower Chest: No acute findings. Hepatobiliary: A soft tissue mass is seen involving the gallbladder neck, measuring approximately 3.2 x 3.0 cm on image 36/2. Ill-defined low-attenuation lesion is seen in the adjacent medial aspect of the right hepatic lobe which measures 2.5 by 2.0 cm on image 37/2. This is suspicious for invasion of the adjacent liver. The gallbladder is distended, however there is no evidence of acute cholecystitis. Mild diffuse biliary ductal dilatation is seen. Pancreas: A low-attenuation mass is seen region of the pancreatic head which measures 3.6 x 2.6 cm on image 32/2. No other pancreatic masses are identified and there is no evidence of pancreatic ductal dilatation. This could represent a primary pancreatic carcinoma or peripancreatic lymphadenopathy. Spleen: No evidence of splenomegaly. Wedge-shaped low-attenuation lesion is seen in the posterior and superior aspect of the spleen, consistent with small splenic infarct. Adrenals/Urinary Tract: Multiple small renal lesions are seen bilaterally, some of which have higher  than fluid attenuation. These could represent hemorrhagic cysts although small solid renal masses cannot be excluded. No evidence of ureteral calculi or hydronephrosis. Unremarkable unopacified urinary bladder. Stomach/Bowel: No evidence of obstruction, inflammatory process or abnormal fluid collections. Vascular/Lymphatic: Porta hepatis and peripancreatic lymphadenopathy is seen with index lymph node measuring 2.8 x 2.3 cm on image 28/2. Retroperitoneal lymphadenopathy is seen in the aortocaval space, with index lymph node measuring 3.7 x 2.7 cm. Mildly enlarged right common iliac lymph node is seen measuring 1.4 cm short axis. Aortic atherosclerotic calcification noted. No acute vascular findings. Reproductive:  No mass or other significant abnormality. Other:  None. Musculoskeletal:  No suspicious bone lesions identified. IMPRESSION: 3.6 cm low-attenuation mass involving the gallbladder neck, with probable invasion of the adjacent right hepatic lobe. Differential  diagnosis includes primary gallbladder carcinoma or metastatic disease. 3.6 cm low-attenuation mass involving the pancreatic head, which could represent a primary pancreatic carcinoma or metastatic lymphadenopathy. Abdominal and right common iliac lymphadenopathy, consistent with metastatic disease. Mild diffuse biliary ductal dilatation. Small splenic infarct. Several small indeterminate bilateral renal lesions, which could represent hemorrhagic cysts although small solid renal masses cannot be excluded. Recommend continued attention on follow-up imaging, or abdomen MRI without and with contrast for further characterization. Aortic Atherosclerosis (ICD10-I70.0). Electronically Signed   By: Marlaine Hind M.D.   On: 05/07/2021 10:00   [place] CORE BIOPSY (LYMPH NODES)  Result Date: 05/01/2021 INDICATION: Mediastinal and left neck lymphadenopathy. EXAM: ULTRASOUND GUIDED CORE NEEDLE BIOPSY BIOPSY OF ENLARGED LEFT NECK LYMPH NODE. MEDICATIONS: None.  ANESTHESIA/SEDATION: Fentanyl 25 mcg IV; Versed 1 mg IV Moderate Sedation Time:  20 minutes The patient was continuously monitored during the procedure by the interventional radiology nurse under my direct supervision. PROCEDURE: The procedure, risks, benefits, and alternatives were explained to the patient. Questions regarding the procedure were encouraged and answered. The patient understands and consents to the procedure. The left neck skin was prepped with chlorhexidine in a sterile fashion, and a sterile drape was applied covering the operative field. A sterile gown and sterile gloves were used for the procedure. Local anesthesia was provided with 1% Lidocaine. Following local lidocaine administration, four 18 gauge cores were obtained from the enlarged left neck lymph node utilizing continuous ultrasound guidance. Samples were sent to pathology in sterile saline. Needle removed and hemostasis achieved with 2 minutes of manual compression. Post procedure ultrasound images showed no evidence of significant hemorrhage. COMPLICATIONS: None immediate. FINDINGS: Mildly enlarged left neck lymph node. IMPRESSION: Ultrasound-guided biopsy of enlarged left neck lymph node. Electronically Signed   By: Miachel Roux M.D.   On: 05/01/2021 16:54   IR IMAGING GUIDED PORT INSERTION  Result Date: 05/15/2021 INDICATION: 75 year old with pancreatic cancer. Port-A-Cath needed for treatment. EXAM: FLUOROSCOPIC AND ULTRASOUND GUIDED PLACEMENT OF A SUBCUTANEOUS PORT COMPARISON:  None. MEDICATIONS: Moderate sedation ANESTHESIA/SEDATION: Versed 3.0 mg IV; Fentanyl 100 mcg IV; Moderate Sedation Time:  27 minutes The patient was continuously monitored during the procedure by the interventional radiology nurse under my direct supervision. FLUOROSCOPY TIME:  18 seconds, 7 mGy COMPLICATIONS: None immediate. PROCEDURE: The procedure, risks, benefits, and alternatives were explained to the patient. Questions regarding the procedure were  encouraged and answered. The patient understands and consents to the procedure. Patient was placed supine on the interventional table. Ultrasound confirmed a patent right internal jugular vein. Ultrasound image was saved for documentation. The right chest and neck were cleaned with a skin antiseptic and a sterile drape was placed. Maximal barrier sterile technique was utilized including caps, mask, sterile gowns, sterile gloves, sterile drape, hand hygiene and skin antiseptic. The right neck was anesthetized with 1% lidocaine. Small incision was made in the right neck with a blade. Micropuncture set was placed in the right internal jugular vein with ultrasound guidance. The micropuncture wire was used for measurement purposes. The right chest was anesthetized with 1% lidocaine with epinephrine. #15 blade was used to make an incision and a subcutaneous port pocket was formed. [place] was assembled. Subcutaneous tunnel was formed with a stiff tunneling device. The port catheter was brought through the subcutaneous tunnel. The port was placed in the subcutaneous pocket. The micropuncture set was exchanged for a peel-away sheath. The catheter was placed through the peel-away sheath and the tip was positioned at the superior  cavoatrial junction. Catheter placement was confirmed with fluoroscopy. The port was accessed and flushed with heparinized saline. The port pocket was closed using two layers of absorbable sutures and Dermabond. The vein skin site was closed using a single layer of absorbable suture and Dermabond. Sterile dressings were applied. Patient tolerated the procedure well without an immediate complication. Ultrasound and fluoroscopic images were taken and saved for this procedure. IMPRESSION: Placement of a subcutaneous power-injectable port device. Catheter tip at the superior cavoatrial junction. Electronically Signed   By: Markus Daft M.D.   On: 05/15/2021 14:12    ASSESSMENT &   BLAYDON BRENNA 75 y.o. male with   After review of the labs, review of the records, and discussion with the patient the patients findings are most consistent with metastatic adenocarcinoma the pancreas.  This is based on the pathological findings and the distribution of the lymph node involvement.  We will order tumor markers today in order to help confirm the diagnosis.  Lung adenocarcinoma certainly the differential but at this time without a clear lung primary I would favor metastatic pancreatic cancer.  At this time would recommend pursuing port placement with consideration of gemcitabine and Abraxane.  This regimen would consist of gemcitabine 1000 mg per metered squared IV on days 1, 8, 15 and albumin-bound paclitaxel 125 mg per metered squared on days 1, 8, 15 of a 28-day cycle.  This would be continued until intolerance or progression.  # Metastatic Adenocarcinoma of the Pancreas -labs reviewed with patient and chemotherapy questions answered. -chemotherapy orders reviewed and signed, premeds adjusted. Ca 19-9 and CEA elevated consistent with pancreatico-biliary metastatic malignancy. -f/u for scheduled chemotherapy and f/u with Dr Lorenso Courier as scheduled.  Brunetta Genera MD  . The total time spent in the appointment was 30 minutes and more than 50% was on counseling and direct patient cares., reviewing and mx chemotherapy orders.

## 2021-05-27 NOTE — Addendum Note (Signed)
Addended by: Elyse Jarvis on: 05/27/2021 01:12 PM   Modules accepted: Orders

## 2021-05-27 NOTE — Progress Notes (Signed)
   Subjective:    Patient ID: Lee Kim, male    DOB: [birth year], 75 y.o.   MRN: [id number]  HPI He is here for recheck after recent emergency room visit for evaluation of fall with weakness and fatigue.  He is in the process of getting chemotherapy for his underlying pancreatic cancer.  He has a scheduled appointment for chemotherapy again on Friday.  Today he is mainly feeling weak and fatigued wanting to go home and sleep.   Review of Systems     Objective:   Physical Exam Alert and quite weak appearing.  Cardiac exam shows a slight tachycardia and irregular.  Lungs are clear to auscultation.  Blood work does indicate elevated creatinine.  His medications were reviewed. Hemoglobin A1c is 4.8.      Assessment & Plan:   Glucose intolerance (impaired glucose tolerance)  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 (HCC)  Weakness I discussed this with him and his wife and encouraged increased fluid intake and explained that we did not need to worry about sugar at the present moment.  I explained that I am more concerned about keeping him hydrated especially going through the chemotherapy.

## 2021-05-28 ENCOUNTER — Inpatient Hospital Stay: Payer: PPO

## 2021-05-28 ENCOUNTER — Encounter: Payer: Self-pay | Admitting: Hematology and Oncology

## 2021-05-28 ENCOUNTER — Inpatient Hospital Stay (HOSPITAL_BASED_OUTPATIENT_CLINIC_OR_DEPARTMENT_OTHER): Payer: PPO | Admitting: Hematology and Oncology

## 2021-05-28 ENCOUNTER — Other Ambulatory Visit: Payer: Self-pay | Admitting: Hematology and Oncology

## 2021-05-28 VITALS — BP 99/62 | HR 80 | Temp 96.8°F | Resp 20 | Ht 73.0 in | Wt 248.8 lb

## 2021-05-28 DIAGNOSIS — Z5111 Encounter for antineoplastic chemotherapy: Secondary | ICD-10-CM | POA: Diagnosis not present

## 2021-05-28 DIAGNOSIS — C259 Malignant neoplasm of pancreas, unspecified: Secondary | ICD-10-CM

## 2021-05-28 DIAGNOSIS — Z95828 Presence of other vascular implants and grafts: Secondary | ICD-10-CM

## 2021-05-28 DIAGNOSIS — C772 Secondary and unspecified malignant neoplasm of intra-abdominal lymph nodes: Secondary | ICD-10-CM | POA: Diagnosis not present

## 2021-05-28 LAB — CBC WITH DIFFERENTIAL (CANCER CENTER ONLY)
Abs Immature Granulocytes: 0.04 10*3/uL (ref 0.00–0.07)
Basophils Absolute: 0 10*3/uL (ref 0.0–0.1)
Basophils Relative: 1 %
Eosinophils Absolute: 0.1 10*3/uL (ref 0.0–0.5)
Eosinophils Relative: 2 %
HCT: 26 % — ABNORMAL LOW (ref 39.0–52.0)
Hemoglobin: 8.7 g/dL — ABNORMAL LOW (ref 13.0–17.0)
Immature Granulocytes: 1 %
Lymphocytes Relative: 15 %
Lymphs Abs: 0.5 10*3/uL — ABNORMAL LOW (ref 0.7–4.0)
MCH: 32.7 pg (ref 26.0–34.0)
MCHC: 33.5 g/dL (ref 30.0–36.0)
MCV: 97.7 fL (ref 80.0–100.0)
Monocytes Absolute: 0.3 10*3/uL (ref 0.1–1.0)
Monocytes Relative: 10 %
Neutro Abs: 2.4 10*3/uL (ref 1.7–7.7)
Neutrophils Relative %: 71 %
Platelet Count: 83 10*3/uL — ABNORMAL LOW (ref 150–400)
RBC: 2.66 MIL/uL — ABNORMAL LOW (ref 4.22–5.81)
RDW: 14.8 % (ref 11.5–15.5)
WBC Count: 3.4 10*3/uL — ABNORMAL LOW (ref 4.0–10.5)
nRBC: 0 % (ref 0.0–0.2)

## 2021-05-28 LAB — CMP (CANCER CENTER ONLY)
ALT: 187 U/L — ABNORMAL HIGH (ref 0–44)
AST: 127 U/L — ABNORMAL HIGH (ref 15–41)
Albumin: 2.8 g/dL — ABNORMAL LOW (ref 3.5–5.0)
Alkaline Phosphatase: 179 U/L — ABNORMAL HIGH (ref 38–126)
Anion gap: 8 (ref 5–15)
BUN: 69 mg/dL — ABNORMAL HIGH (ref 8–23)
CO2: 21 mmol/L — ABNORMAL LOW (ref 22–32)
Calcium: 8.5 mg/dL — ABNORMAL LOW (ref 8.9–10.3)
Chloride: 105 mmol/L (ref 98–111)
Creatinine: 1.93 mg/dL — ABNORMAL HIGH (ref 0.61–1.24)
GFR, Estimated: 36 mL/min — ABNORMAL LOW (ref >60)
Glucose, Bld: 135 mg/dL — ABNORMAL HIGH (ref 70–99)
Potassium: 3.7 mmol/L (ref 3.5–5.1)
Sodium: 134 mmol/L — ABNORMAL LOW (ref 135–145)
Total Bilirubin: 0.5 mg/dL (ref 0.3–1.2)
Total Protein: 6.1 g/dL — ABNORMAL LOW (ref 6.5–8.1)

## 2021-05-28 LAB — MAGNESIUM: Magnesium: 2.5 mg/dL — ABNORMAL HIGH (ref 1.7–2.4)

## 2021-05-28 MED ORDER — SODIUM CHLORIDE 0.9% FLUSH
10.0000 mL | Freq: Once | INTRAVENOUS | Status: AC
  Administered 2021-05-28: 10 mL

## 2021-05-28 MED ORDER — HEPARIN SOD (PORK) LOCK FLUSH 100 UNIT/ML IV SOLN
500.0000 [IU] | Freq: Once | INTRAVENOUS | Status: AC
  Administered 2021-05-28: 500 [IU]

## 2021-05-28 NOTE — Progress Notes (Signed)
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 Found to have enlargement of left supraclavicular lymph node with diffuse mediastinal and upper abdominal lymph nodes. 05/01/2021: [place] core biopsy of left supraclavicular lymph node. Findings consistent with metastatic adenocarcinoma. Lung vs pancreatic origin 05/06/2021: CT abdomen performed. Formal read pending. 05/07/2021: establish care with Dr. Lorenso Courier  05/21/2021: Cycle 1 Day 1 of Gem/Abraxane 05/28/2021: Cycle 1 Day 8 HELD due to cytopenias and poor renal function  Interval History:  AIDDEN MARKOVIC 75 y.o. male with medical history significant for metastatic adenocarcinoma of the pancreas who presents for a follow up visit. The patient's last visit was on 05/21/2021 with Dr. Irene Limbo. In the interim since the last visit he underwent Cycle 1 of Gem/Abraxane on 05/21/2021 and had an ED visit for near syncopal episode on 05/25/2021.   On exam today Mr. Armistead reports that in order to stay hydrated after his last cycle of chemotherapy he was drinking mostly cranberry juice.  He was unaware that cranberry juice was a diuretic and that he was making his kidney function worse.  As he has had some diarrhea approximately 4 times per day since his last episode.  He notes that he had 2 near syncopal episodes where he had fall to the ground nontraumatic. He was seen on 05/25/2021 and was told to increase hydration.  He has not been having any issues with fevers, chills, sweats, nausea, vomiting or  diarrhea.  10 point ROS is listed below.  Due to concern for the above findings I told the patient will need to hold his chemotherapy and reassess next week.  MEDICAL HISTORY:  Past Medical History:  Diagnosis Date   Colonic polyp    Diverticulitis    Erythrocytosis    Hypertension    Obesity    OSA (obstructive sleep apnea)     SURGICAL HISTORY: Past Surgical History:  Procedure Laterality Date   COLONOSCOPY  03/2003   Dr. Wynetta Emery   COLONOSCOPY WITH PROPOFOL N/A 11/23/2016   Procedure: COLONOSCOPY WITH PROPOFOL;  Surgeon: Garlan Fair, MD;  Location: [facility];  Service: Endoscopy;  Laterality: N/A;   EYE SURGERY     IR IMAGING GUIDED PORT INSERTION  05/15/2021    SOCIAL HISTORY: Social History   Socioeconomic History   Marital status: Married    Spouse name: Not on file   Number of children: 1   Years of education: Not on file   Highest education level: Not on file  Occupational History   Not on file  Tobacco Use   Smoking status: Every Day    Types: Cigarettes   Smokeless tobacco: Never  Vaping Use   Vaping Use: Never used  Substance and Sexual Activity   Alcohol use: Yes    Alcohol/week: 7.0 standard drinks    Types: 7 Glasses of wine per week    Comment: 3-4 glasses wine per night   Drug use: No   Sexual activity: Yes  Other Topics Concern   Not on file  Social History Narrative   Not on file   Social Determinants of Health   Financial  Resource Strain: Not on file  Food Insecurity: Not on file  Transportation Needs: Not on file  Physical Activity: Not on file  Stress: Not on file  Social Connections: Not on file  Intimate Partner Violence: Not on file    FAMILY HISTORY: Family History  Problem Relation Age of Onset   Hypertension Mother    Hypertension Father    Hypertension Sister     ALLERGIES:  is allergic to benzalkonium chloride.  MEDICATIONS:  Current Outpatient Medications  Medication Sig Dispense Refill   amLODipine  (NORVASC) 10 MG tablet Take 1 tablet (10 mg total) by mouth daily. 90 tablet 3   cromolyn (NASALCROM) 5.2 MG/ACT nasal spray Place 1 spray into both nostrils 2 (two) times daily as needed for allergies.     ELIQUIS 5 MG TABS tablet TAKE 1 TABLET(5 MG) BY MOUTH TWICE DAILY (Patient taking differently: Take 5 mg by mouth 2 (two) times daily.) 180 tablet 0   Gemcitabine HCl (GEMZAR IV) Inject 1 Bag into the vein once a week. Pt gets for 30 minutes once per week     lidocaine-prilocaine (EMLA) cream Apply 1 application topically as needed. 30 g 0   lisinopril-hydrochlorothiazide (ZESTORETIC) 10-12.5 MG tablet TAKE 1 TABLET BY MOUTH DAILY 90 tablet 3   Multiple Vitamins-Minerals (PRESERVISION AREDS 2 PO) Take 1 tablet by mouth 2 (two) times daily.      ondansetron (ZOFRAN) 8 MG tablet Take 1 tablet (8 mg total) by mouth every 8 (eight) hours as needed for nausea or vomiting. 30 tablet 0   PACLitaxel Protein-Bound Part (ABRAXANE IV) Inject 1 Bag into the vein once a week. Pt gets for 30 minutes once per week     prochlorperazine (COMPAZINE) 10 MG tablet Take 1 tablet (10 mg total) by mouth every 6 (six) hours as needed for nausea or vomiting. (Patient not taking: Reported on 05/27/2021) 30 tablet 0   psyllium (METAMUCIL) 58.6 % packet Take 1 packet by mouth daily.     rosuvastatin (CRESTOR) 40 MG tablet Take 40 mg by mouth every evening.     tadalafil (CIALIS) 20 MG tablet TAKE 1 TABLET BY MOUTH EVERY DAY AS NEEDED (Patient taking differently: Take 10 mg by mouth daily as needed for erectile dysfunction.) 30 tablet 5   TRAVATAN Z 0.004 % SOLN ophthalmic solution Place 1 drop into the right eye at bedtime. [place]  12   triamcinolone (KENALOG) 0.1 % APPLY EXTERNALLY TO THE AFFECTED AREA TWICE DAILY (Patient taking differently: Apply 1 application topically daily as needed (psoriasis).) 45 g 5   No current facility-administered medications for this visit.    REVIEW OF SYSTEMS:   Constitutional: ( - )  fevers, ( - )  chills , ( - ) night sweats Eyes: ( - ) blurriness of vision, ( - ) double vision, ( - ) watery eyes Ears, nose, mouth, throat, and face: ( - ) mucositis, ( - ) sore throat Respiratory: ( - ) cough, ( - ) dyspnea, ( - ) wheezes Cardiovascular: ( - ) palpitation, ( - ) chest discomfort, ( - ) lower extremity swelling Gastrointestinal:  ( - ) nausea, ( - ) heartburn, ( - ) change in bowel habits Skin: ( - ) abnormal skin rashes Lymphatics: ( - ) new lymphadenopathy, ( - ) easy bruising Neurological: ( - ) numbness, ( - ) tingling, ( - ) new weaknesses Behavioral/Psych: ( - ) mood change, ( - ) new changes  All other systems were  reviewed with the patient and are negative.  PHYSICAL EXAMINATION: ECOG PERFORMANCE STATUS: 1 - Symptomatic but completely ambulatory  Vitals:   05/28/21 1104  BP: 99/62  Pulse: 80  Resp: 20  Temp: (!) 96.8 F (36 C)  SpO2: 100%   Filed Weights   05/28/21 1104  Weight: 248 lb 12.8 oz (112.9 kg)    GENERAL: Well-appearing elderly Caucasian male, alert, no distress and comfortable SKIN: skin color, texture, turgor are normal, no rashes or significant lesions EYES: conjunctiva are pink and non-injected, sclera clear LUNGS: clear to auscultation and percussion with normal breathing effort HEART: regular rate & rhythm and no murmurs and no lower extremity edema Musculoskeletal: no cyanosis of digits and no clubbing  PSYCH: alert & oriented x 3, fluent speech NEURO: no focal motor/sensory deficits  LABORATORY DATA:  I have reviewed the data as listed CBC Latest Ref Rng & Units 05/28/2021 05/26/2021 05/25/2021  WBC 4.0 - 10.5 K/uL 3.4(L) 8.5 9.8  Hemoglobin 13.0 - 17.0 g/dL 8.7(L) 11.0(L) 10.5(L)  Hematocrit 39.0 - 52.0 % 26.0(L) 33.0(L) 31.5(L)  Platelets 150 - 400 K/uL 83(L) 97(L) 91(L)    CMP Latest Ref Rng & Units 05/28/2021 05/26/2021 05/25/2021  Glucose 70 - 99 mg/dL 135(H) 137(H) 140(H)  BUN 8 - 23 mg/dL 69(H) 66(H) 61(H)  Creatinine  0.61 - 1.24 mg/dL 1.93(H) 2.30(H) 2.06(H)  Sodium 135 - 145 mmol/L 134(L) 136 136  Potassium 3.5 - 5.1 mmol/L 3.7 3.7 3.6  Chloride 98 - 111 mmol/L 105 105 105  CO2 22 - 32 mmol/L 21(L) 20(L) 21(L)  Calcium 8.9 - 10.3 mg/dL 8.5(L) 8.9 8.6(L)  Total Protein 6.5 - 8.1 g/dL 6.1(L) 7.2 7.1  Total Bilirubin 0.3 - 1.2 mg/dL 0.5 1.3(H) 1.7(H)  Alkaline Phos 38 - 126 U/L 179(H) 133(H) 124  AST 15 - 41 U/L 127(H) 85(H) 72(H)  ALT 0 - 44 U/L 187(H) 92(H) 84(H)     RADIOGRAPHIC STUDIES: I have personally reviewed the radiological images as listed and agreed with the findings in the report. MR Brain W Wo Contrast  Addendum Date: 05/22/2021   ADDENDUM REPORT: 05/22/2021 09:50 ADDENDUM: These results were called by telephone on 05/22/2021 at 9:50 am to provider Maryellen Pile, PA, who verbally acknowledged these results. Electronically Signed   By: Merilyn Baba M.D.   On: 05/22/2021 09:50   Result Date: 05/22/2021 CLINICAL DATA:  Evaluation for metastatic disease, history of pancreatic cancer. EXAM: MRI HEAD WITHOUT AND WITH CONTRAST TECHNIQUE: Multiplanar, multiecho pulse sequences of the brain and surrounding structures were obtained without and with intravenous contrast. CONTRAST:  15m MULTIHANCE GADOBENATE DIMEGLUMINE 529 MG/ML IV SOLN COMPARISON:  None. FINDINGS: Brain: Area of encephalomalacia in the right temporal lobe, likely remote infarct. Areas of contrast enhancement within this area (series 22, image 76 and 71) correspond with areas of restricted diffusion, most likely subacute infarcts. Additional cortical enhancement in the lateral left frontal lobe (series 22, images 81-100), which also correlates with areas of restricted diffusion, also favored to represent subacute infarcts. Additional small area of encephalomalacia in the right occipital lobe (series 22, image 61), with adjacent small focus of enhancement (series 22, image 68), which does not have a definite correlate of restricted  diffusion. Punctate foci and of restricted diffusion in the left parietal lobe (series 6, image 76, right parietal lobe (series 9, image 42), and bilateral cerebellar hemispheres (series 9, image 43, without enhancing correlates, may represent more acute infarcts. Multiple punctate infarcts and T2 hyperintense foci throughout  the bilateral cerebral white matter, likely the sequela of smaller, remote parenchymal infarcts and chronic small vessel ischemic disease. Lacunar infarcts in the bilateral basal ganglia and left thalamus. Vascular: Normal flow voids. Skull and upper cervical spine: Normal marrow signal. Sinuses/Orbits: Small mucous retention cyst in the right maxillary sinus. Status post right lens replacement. Other: None. IMPRESSION: 1. Large area of encephalomalacia in the right temporal lobe, consistent with remote right MCA territory infarct. Foci of contrast enhancement within this area, which correspond to areas of restricted diffusion, are felt to reflect subacute infarcts rather than enhancing metastatic disease. Attention on follow-up. 2. Area of encephalomalacia in the right occipital lobe, likely result of remote infarct, with adjacent focus of contrast enhancement without definite correlate on restricted diffusion; this enhancing focus may reflect subacute infarct, but could also represent metastatic disease. Attention on follow-up. 3. Cortical enhancement in the lateral left frontal lobe, which correlates to an area of restricted diffusion, also favored to represent subacute infarct. 4. Additional punctate foci of restricted diffusion in the bilateral parietal lobes and cerebellar hemispheres may represent more acute infarcts. Electronically Signed: By: Merilyn Baba M.D. On: 05/22/2021 09:15   CT Abdomen Pelvis W Contrast  Result Date: 05/07/2021 CLINICAL DATA:  Thoracic lymphadenopathy. Evaluate for primary malignancy. EXAM: CT ABDOMEN AND PELVIS WITH CONTRAST TECHNIQUE: Multidetector CT  imaging of the abdomen and pelvis was performed using the standard protocol following bolus administration of intravenous contrast. CONTRAST:  160m ISOVUE-300 IOPAMIDOL (ISOVUE-300) INJECTION 61% COMPARISON:  Chest CTA on 03/31/2021 FINDINGS: Lower Chest: No acute findings. Hepatobiliary: A soft tissue mass is seen involving the gallbladder neck, measuring approximately 3.2 x 3.0 cm on image 36/2. Ill-defined low-attenuation lesion is seen in the adjacent medial aspect of the right hepatic lobe which measures 2.5 by 2.0 cm on image 37/2. This is suspicious for invasion of the adjacent liver. The gallbladder is distended, however there is no evidence of acute cholecystitis. Mild diffuse biliary ductal dilatation is seen. Pancreas: A low-attenuation mass is seen region of the pancreatic head which measures 3.6 x 2.6 cm on image 32/2. No other pancreatic masses are identified and there is no evidence of pancreatic ductal dilatation. This could represent a primary pancreatic carcinoma or peripancreatic lymphadenopathy. Spleen: No evidence of splenomegaly. Wedge-shaped low-attenuation lesion is seen in the posterior and superior aspect of the spleen, consistent with small splenic infarct. Adrenals/Urinary Tract: Multiple small renal lesions are seen bilaterally, some of which have higher than fluid attenuation. These could represent hemorrhagic cysts although small solid renal masses cannot be excluded. No evidence of ureteral calculi or hydronephrosis. Unremarkable unopacified urinary bladder. Stomach/Bowel: No evidence of obstruction, inflammatory process or abnormal fluid collections. Vascular/Lymphatic: Porta hepatis and peripancreatic lymphadenopathy is seen with index lymph node measuring 2.8 x 2.3 cm on image 28/2. Retroperitoneal lymphadenopathy is seen in the aortocaval space, with index lymph node measuring 3.7 x 2.7 cm. Mildly enlarged right common iliac lymph node is seen measuring 1.4 cm short axis. Aortic  atherosclerotic calcification noted. No acute vascular findings. Reproductive:  No mass or other significant abnormality. Other:  None. Musculoskeletal:  No suspicious bone lesions identified. IMPRESSION: 3.6 cm low-attenuation mass involving the gallbladder neck, with probable invasion of the adjacent right hepatic lobe. Differential diagnosis includes primary gallbladder carcinoma or metastatic disease. 3.6 cm low-attenuation mass involving the pancreatic head, which could represent a primary pancreatic carcinoma or metastatic lymphadenopathy. Abdominal and right common iliac lymphadenopathy, consistent with metastatic disease. Mild diffuse biliary ductal dilatation. Small  splenic infarct. Several small indeterminate bilateral renal lesions, which could represent hemorrhagic cysts although small solid renal masses cannot be excluded. Recommend continued attention on follow-up imaging, or abdomen MRI without and with contrast for further characterization. Aortic Atherosclerosis (ICD10-I70.0). Electronically Signed   By: Marlaine Hind M.D.   On: 05/07/2021 10:00   [place] CORE BIOPSY (LYMPH NODES)  Result Date: 05/01/2021 INDICATION: Mediastinal and left neck lymphadenopathy. EXAM: ULTRASOUND GUIDED CORE NEEDLE BIOPSY BIOPSY OF ENLARGED LEFT NECK LYMPH NODE. MEDICATIONS: None. ANESTHESIA/SEDATION: Fentanyl 25 mcg IV; Versed 1 mg IV Moderate Sedation Time:  20 minutes The patient was continuously monitored during the procedure by the interventional radiology nurse under my direct supervision. PROCEDURE: The procedure, risks, benefits, and alternatives were explained to the patient. Questions regarding the procedure were encouraged and answered. The patient understands and consents to the procedure. The left neck skin was prepped with chlorhexidine in a sterile fashion, and a sterile drape was applied covering the operative field. A sterile gown and sterile gloves were used for the procedure. Local anesthesia was  provided with 1% Lidocaine. Following local lidocaine administration, four 18 gauge cores were obtained from the enlarged left neck lymph node utilizing continuous ultrasound guidance. Samples were sent to pathology in sterile saline. Needle removed and hemostasis achieved with 2 minutes of manual compression. Post procedure ultrasound images showed no evidence of significant hemorrhage. COMPLICATIONS: None immediate. FINDINGS: Mildly enlarged left neck lymph node. IMPRESSION: Ultrasound-guided biopsy of enlarged left neck lymph node. Electronically Signed   By: Miachel Roux M.D.   On: 05/01/2021 16:54   IR IMAGING GUIDED PORT INSERTION  Result Date: 05/15/2021 INDICATION: 75 year old with pancreatic cancer. Port-A-Cath needed for treatment. EXAM: FLUOROSCOPIC AND ULTRASOUND GUIDED PLACEMENT OF A SUBCUTANEOUS PORT COMPARISON:  None. MEDICATIONS: Moderate sedation ANESTHESIA/SEDATION: Versed 3.0 mg IV; Fentanyl 100 mcg IV; Moderate Sedation Time:  27 minutes The patient was continuously monitored during the procedure by the interventional radiology nurse under my direct supervision. FLUOROSCOPY TIME:  18 seconds, 7 mGy COMPLICATIONS: None immediate. PROCEDURE: The procedure, risks, benefits, and alternatives were explained to the patient. Questions regarding the procedure were encouraged and answered. The patient understands and consents to the procedure. Patient was placed supine on the interventional table. Ultrasound confirmed a patent right internal jugular vein. Ultrasound image was saved for documentation. The right chest and neck were cleaned with a skin antiseptic and a sterile drape was placed. Maximal barrier sterile technique was utilized including caps, mask, sterile gowns, sterile gloves, sterile drape, hand hygiene and skin antiseptic. The right neck was anesthetized with 1% lidocaine. Small incision was made in the right neck with a blade. Micropuncture set was placed in the right internal jugular  vein with ultrasound guidance. The micropuncture wire was used for measurement purposes. The right chest was anesthetized with 1% lidocaine with epinephrine. #15 blade was used to make an incision and a subcutaneous port pocket was formed. [place] was assembled. Subcutaneous tunnel was formed with a stiff tunneling device. The port catheter was brought through the subcutaneous tunnel. The port was placed in the subcutaneous pocket. The micropuncture set was exchanged for a peel-away sheath. The catheter was placed through the peel-away sheath and the tip was positioned at the superior cavoatrial junction. Catheter placement was confirmed with fluoroscopy. The port was accessed and flushed with heparinized saline. The port pocket was closed using two layers of absorbable sutures and Dermabond. The vein skin site was closed using a single layer of absorbable  suture and Dermabond. Sterile dressings were applied. Patient tolerated the procedure well without an immediate complication. Ultrasound and fluoroscopic images were taken and saved for this procedure. IMPRESSION: Placement of a subcutaneous power-injectable port device. Catheter tip at the superior cavoatrial junction. Electronically Signed   By: Markus Daft M.D.   On: 05/15/2021 14:12    ASSESSMENT & PLAN TARIG ZIMMERS 75 y.o. male with medical history significant for metastatic adenocarcinoma of the pancreas who presents for a follow up visit.  After review of the labs, review of the records, and discussion with the patient the patients findings are most consistent with metastatic adenocarcinoma the pancreas.  This is based on the pathological findings and the distribution of the lymph node involvement.  Tumor markers ordered on 05/07/2021 support the diagnosis of pancreatic primary.  Lung adenocarcinoma was on the differential but at this time without a clear lung primary I would favor metastatic pancreatic cancer.   At this time would  recommend pursuing port placement with consideration of gemcitabine and Abraxane.  This regimen would consist of gemcitabine 1000 mg per metered squared IV on days 1, 8, 15 and albumin-bound paclitaxel 125 mg per metered squared on days 1, 8, 15 of a 28-day cycle.  This would be continued until intolerance or progression.  # Metastatic Adenocarcinoma of the Pancreas -- Findings at this time are most consistent with metastatic adenocarcinoma the pancreas.   -- Tumor markers collected on 05/07/2021 show an AFP of 2.7, CA 19-9 of 1870, and a CEA of 75.47 --Regimen of choice this time would be gemcitabine and Abraxane --We will order BRCA 1 and 2 mutational status to see if there are options for further therapy --Cycle 1 Day 1 of Gem/Abraxane on 05/21/2021 Plan:  --HOLD treatment tomorrow due to cytopenias and poor renal function --Reassess next week prior to her next treatment   #Supportive Care -- chemotherapy education complete -- port placed -- zofran 34m q8H PRN and compazine 133mPO q6H for nausea -- EMLA cream for port -- no pain medication required at this time.   No orders of the defined types were placed in this encounter.   All questions were answered. The patient knows to call the clinic with any problems, questions or concerns.  A total of more than 30 minutes were spent on this encounter with face-to-face time and non-face-to-face time, including preparing to see the patient, ordering tests and/or medications, counseling the patient and coordination of care as outlined above.   JoLedell PeoplesMD Department of Hematology/Oncology CoTrim[facility] WeSouthwest [facility] Advanced Care Hospitalhone: [phone]: [phone]: [email or web address]  05/28/2021 4:44 PM

## 2021-05-28 NOTE — Progress Notes (Signed)
Met with patient at registration to assist with personal expenses while going through treatment.  Discussed one-time $1000 [employer] grant and qualifications to assist with personal expenses while going through treatment. Asked patient if he was interested in applying and he states not at the moment.  Gave him my card should he become interested and for any additional financial questions or concerns.   

## 2021-05-29 ENCOUNTER — Telehealth: Payer: Self-pay | Admitting: Hematology and Oncology

## 2021-05-29 ENCOUNTER — Inpatient Hospital Stay: Payer: PPO

## 2021-05-29 NOTE — Telephone Encounter (Signed)
R/s appts per 8/25 sch msg. Pt aware.

## 2021-06-02 DIAGNOSIS — G4733 Obstructive sleep apnea (adult) (pediatric): Secondary | ICD-10-CM | POA: Diagnosis not present

## 2021-06-03 MED FILL — Dexamethasone Sodium Phosphate Inj 100 MG/10ML: INTRAMUSCULAR | Qty: 1 | Status: AC

## 2021-06-04 ENCOUNTER — Other Ambulatory Visit: Payer: PPO

## 2021-06-04 ENCOUNTER — Other Ambulatory Visit: Payer: Self-pay

## 2021-06-04 ENCOUNTER — Ambulatory Visit: Payer: PPO

## 2021-06-04 ENCOUNTER — Inpatient Hospital Stay: Payer: PPO

## 2021-06-04 ENCOUNTER — Inpatient Hospital Stay: Payer: PPO | Attending: Hematology and Oncology | Admitting: Hematology and Oncology

## 2021-06-04 VITALS — BP 110/72 | HR 63 | Temp 98.2°F | Resp 18 | Ht 73.0 in | Wt 237.8 lb

## 2021-06-04 DIAGNOSIS — D6481 Anemia due to antineoplastic chemotherapy: Secondary | ICD-10-CM | POA: Insufficient documentation

## 2021-06-04 DIAGNOSIS — R110 Nausea: Secondary | ICD-10-CM | POA: Insufficient documentation

## 2021-06-04 DIAGNOSIS — T451X5A Adverse effect of antineoplastic and immunosuppressive drugs, initial encounter: Secondary | ICD-10-CM | POA: Insufficient documentation

## 2021-06-04 DIAGNOSIS — C259 Malignant neoplasm of pancreas, unspecified: Secondary | ICD-10-CM

## 2021-06-04 DIAGNOSIS — R0602 Shortness of breath: Secondary | ICD-10-CM | POA: Diagnosis not present

## 2021-06-04 DIAGNOSIS — Z95828 Presence of other vascular implants and grafts: Secondary | ICD-10-CM

## 2021-06-04 DIAGNOSIS — R197 Diarrhea, unspecified: Secondary | ICD-10-CM | POA: Insufficient documentation

## 2021-06-04 DIAGNOSIS — Z5111 Encounter for antineoplastic chemotherapy: Secondary | ICD-10-CM | POA: Diagnosis not present

## 2021-06-04 DIAGNOSIS — I10 Essential (primary) hypertension: Secondary | ICD-10-CM | POA: Insufficient documentation

## 2021-06-04 DIAGNOSIS — F17210 Nicotine dependence, cigarettes, uncomplicated: Secondary | ICD-10-CM | POA: Diagnosis not present

## 2021-06-04 DIAGNOSIS — C772 Secondary and unspecified malignant neoplasm of intra-abdominal lymph nodes: Secondary | ICD-10-CM | POA: Insufficient documentation

## 2021-06-04 DIAGNOSIS — G4733 Obstructive sleep apnea (adult) (pediatric): Secondary | ICD-10-CM | POA: Diagnosis not present

## 2021-06-04 DIAGNOSIS — E669 Obesity, unspecified: Secondary | ICD-10-CM | POA: Insufficient documentation

## 2021-06-04 DIAGNOSIS — R5383 Other fatigue: Secondary | ICD-10-CM | POA: Diagnosis not present

## 2021-06-04 LAB — CBC WITH DIFFERENTIAL (CANCER CENTER ONLY)
Abs Immature Granulocytes: 0.13 10*3/uL — ABNORMAL HIGH (ref 0.00–0.07)
Basophils Absolute: 0 10*3/uL (ref 0.0–0.1)
Basophils Relative: 1 %
Eosinophils Absolute: 0.2 10*3/uL (ref 0.0–0.5)
Eosinophils Relative: 2 %
HCT: 30.3 % — ABNORMAL LOW (ref 39.0–52.0)
Hemoglobin: 10.1 g/dL — ABNORMAL LOW (ref 13.0–17.0)
Immature Granulocytes: 2 %
Lymphocytes Relative: 14 %
Lymphs Abs: 0.9 10*3/uL (ref 0.7–4.0)
MCH: 32.9 pg (ref 26.0–34.0)
MCHC: 33.3 g/dL (ref 30.0–36.0)
MCV: 98.7 fL (ref 80.0–100.0)
Monocytes Absolute: 1.1 10*3/uL — ABNORMAL HIGH (ref 0.1–1.0)
Monocytes Relative: 17 %
Neutro Abs: 4 10*3/uL (ref 1.7–7.7)
Neutrophils Relative %: 64 %
Platelet Count: 255 10*3/uL (ref 150–400)
RBC: 3.07 MIL/uL — ABNORMAL LOW (ref 4.22–5.81)
RDW: 16.3 % — ABNORMAL HIGH (ref 11.5–15.5)
WBC Count: 6.2 10*3/uL (ref 4.0–10.5)
nRBC: 0 % (ref 0.0–0.2)

## 2021-06-04 LAB — CMP (CANCER CENTER ONLY)
ALT: 108 U/L — ABNORMAL HIGH (ref 0–44)
AST: 79 U/L — ABNORMAL HIGH (ref 15–41)
Albumin: 2.9 g/dL — ABNORMAL LOW (ref 3.5–5.0)
Alkaline Phosphatase: 190 U/L — ABNORMAL HIGH (ref 38–126)
Anion gap: 13 (ref 5–15)
BUN: 32 mg/dL — ABNORMAL HIGH (ref 8–23)
CO2: 22 mmol/L (ref 22–32)
Calcium: 9.2 mg/dL (ref 8.9–10.3)
Chloride: 105 mmol/L (ref 98–111)
Creatinine: 1.61 mg/dL — ABNORMAL HIGH (ref 0.61–1.24)
GFR, Estimated: 45 mL/min — ABNORMAL LOW
Glucose, Bld: 99 mg/dL (ref 70–99)
Potassium: 4.2 mmol/L (ref 3.5–5.1)
Sodium: 140 mmol/L (ref 135–145)
Total Bilirubin: 0.9 mg/dL (ref 0.3–1.2)
Total Protein: 6.8 g/dL (ref 6.5–8.1)

## 2021-06-04 LAB — MAGNESIUM: Magnesium: 2 mg/dL (ref 1.7–2.4)

## 2021-06-04 MED ORDER — SODIUM CHLORIDE 0.9% FLUSH: 10.0000 mL | Freq: Once | INTRAVENOUS | Status: DC

## 2021-06-04 MED ORDER — SODIUM CHLORIDE 0.9 % IV SOLN
1000.0000 mg/m2 | Freq: Once | INTRAVENOUS | Status: AC
  Administered 2021-06-04: 2432 mg via INTRAVENOUS
  Filled 2021-06-04: qty 63.96

## 2021-06-04 MED ORDER — PACLITAXEL PROTEIN-BOUND CHEMO INJECTION 100 MG
125.0000 mg/m2 | Freq: Once | INTRAVENOUS | Status: AC
  Administered 2021-06-04: 300 mg via INTRAVENOUS
  Filled 2021-06-04: qty 60

## 2021-06-04 MED ORDER — ACETAMINOPHEN 500 MG PO TABS: 1000.0000 mg | ORAL_TABLET | Freq: Once | ORAL | Status: DC

## 2021-06-04 MED ORDER — SODIUM CHLORIDE 0.9% FLUSH
10.0000 mL | INTRAVENOUS | Status: DC | PRN
Start: 2021-06-04 — End: 2021-06-04
  Administered 2021-06-04: 10 mL

## 2021-06-04 MED ORDER — FAMOTIDINE 20 MG IN NS 100 ML IVPB: 20.0000 mg | Freq: Once | INTRAVENOUS | Status: DC

## 2021-06-04 MED ORDER — ONDANSETRON HCL 4 MG/2ML IJ SOLN
8.0000 mg | Freq: Once | INTRAMUSCULAR | Status: AC
Start: 2021-06-04 — End: 2021-06-04
  Administered 2021-06-04: 8 mg via INTRAVENOUS
  Filled 2021-06-04: qty 4

## 2021-06-04 MED ORDER — PACLITAXEL PROTEIN-BOUND CHEMO INJECTION 100 MG: 125.0000 mg/m2 | Freq: Once | INTRAVENOUS | Status: DC

## 2021-06-04 MED ORDER — SODIUM CHLORIDE 0.9 % IV SOLN: 1000.0000 mg/m2 | Freq: Once | INTRAVENOUS | Status: DC

## 2021-06-04 MED ORDER — SODIUM CHLORIDE 0.9 % IV SOLN: Freq: Once | INTRAVENOUS | Status: AC

## 2021-06-04 MED ORDER — LORATADINE 10 MG PO TABS: 10.0000 mg | ORAL_TABLET | Freq: Every day | ORAL | Status: DC

## 2021-06-04 MED ORDER — ONDANSETRON HCL 4 MG/2ML IJ SOLN: 8.0000 mg | Freq: Once | INTRAMUSCULAR | Status: DC

## 2021-06-04 MED ORDER — SODIUM CHLORIDE 0.9% FLUSH: 10.0000 mL | INTRAVENOUS | Status: DC | PRN

## 2021-06-04 MED ORDER — SODIUM CHLORIDE 0.9 % IV SOLN
10.0000 mg | Freq: Once | INTRAVENOUS | Status: AC
  Administered 2021-06-04: 10 mg via INTRAVENOUS
  Filled 2021-06-04: qty 1

## 2021-06-04 MED ORDER — HEPARIN SOD (PORK) LOCK FLUSH 100 UNIT/ML IV SOLN
500.0000 [IU] | Freq: Once | INTRAVENOUS | Status: AC | PRN
  Administered 2021-06-04: 500 [IU]

## 2021-06-04 MED ORDER — SODIUM CHLORIDE 0.9 % IV SOLN: 10.0000 mg | Freq: Once | INTRAVENOUS | Status: DC

## 2021-06-04 MED ORDER — FAMOTIDINE 20 MG IN NS 100 ML IVPB
20.0000 mg | Freq: Once | INTRAVENOUS | Status: AC
  Administered 2021-06-04: 20 mg via INTRAVENOUS
  Filled 2021-06-04: qty 100

## 2021-06-04 MED ORDER — ACETAMINOPHEN 500 MG PO TABS
1000.0000 mg | ORAL_TABLET | Freq: Once | ORAL | Status: AC
  Administered 2021-06-04: 1000 mg via ORAL
  Filled 2021-06-04: qty 2

## 2021-06-04 MED ORDER — SODIUM CHLORIDE 0.9 % IV SOLN: Freq: Once | INTRAVENOUS | Status: DC

## 2021-06-04 MED ORDER — HEPARIN SOD (PORK) LOCK FLUSH 100 UNIT/ML IV SOLN: 500.0000 [IU] | Freq: Once | INTRAVENOUS | Status: DC | PRN

## 2021-06-04 MED ORDER — LORATADINE 10 MG PO TABS
10.0000 mg | ORAL_TABLET | Freq: Every day | ORAL | Status: DC
  Administered 2021-06-04: 10 mg via ORAL
  Filled 2021-06-04: qty 1

## 2021-06-04 NOTE — Progress Notes (Signed)
Provider Dr. Lorenso Courier aware of creatinine of 1.61 and alt 108, okay to proceed with tx as scheduled.

## 2021-06-04 NOTE — Progress Notes (Signed)
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 Found to have enlargement of left supraclavicular lymph node with diffuse mediastinal and upper abdominal lymph nodes. 05/01/2021: [place] core biopsy of left supraclavicular lymph node. Findings consistent with metastatic adenocarcinoma. Lung vs pancreatic origin 05/06/2021: CT abdomen performed. Formal read pending. 05/07/2021: establish care with Dr. Lorenso Courier  05/21/2021: Cycle 1 Day 1 of Gem/Abraxane 05/28/2021: Cycle 1 Day 8 HELD due to cytopenias and poor renal function  Interval History:  ASTIN SAYRE 75 y.o. male with medical history significant for metastatic adenocarcinoma of the pancreas who presents for a follow up visit. The patient's last visit was on 05/28/2021 with Dr. Irene Limbo. In the interim since the last visit he underwent Cycle 1 of Gem/Abraxane on 05/21/2021, also had to hold chemotherapy on 05/28/2021 due to cytopenias/poor kidney function.   On exam today Mr. Madan reports that he "feels fine".  He notes that he does still have fatigue and has not changed since her last visit.  He does have some loose stools and diarrhea but it has "abated some".  He notes he has not been taking any medications in order to slow down his bowel movements.  He denies any nausea or vomiting but does endorse occasional lightheadedness and dizziness.  He is doing his best to stay hydrated by drinking plenty of water.  He has not been having any issues with fevers, chills, or sweats. A  10 point ROS  is listed below.   MEDICAL HISTORY:  Past Medical History:  Diagnosis Date   Colonic polyp    Diverticulitis    Erythrocytosis    Hypertension    Obesity    OSA (obstructive sleep apnea)     SURGICAL HISTORY: Past Surgical History:  Procedure Laterality Date   COLONOSCOPY  03/2003   Dr. Wynetta Emery   COLONOSCOPY WITH PROPOFOL N/A 11/23/2016   Procedure: COLONOSCOPY WITH PROPOFOL;  Surgeon: Garlan Fair, MD;  Location: [facility];  Service: Endoscopy;  Laterality: N/A;   EYE SURGERY     IR IMAGING GUIDED PORT INSERTION  05/15/2021    SOCIAL HISTORY: Social History   Socioeconomic History   Marital status: Married    Spouse name: Not on file   Number of children: 1   Years of education: Not on file   Highest education level: Not on file  Occupational History   Not on file  Tobacco Use   Smoking status: Every Day    Types: Cigarettes   Smokeless tobacco: Never  Vaping Use   Vaping Use: Never used  Substance and Sexual Activity   Alcohol use: Yes    Alcohol/week: 7.0 standard drinks    Types: 7 Glasses of wine per week    Comment: 3-4 glasses wine per night   Drug use: No   Sexual activity: Yes  Other Topics Concern   Not on file  Social History Narrative   Not on file   Social Determinants of Health   Financial Resource Strain: Not on file  Food Insecurity: Not on file  Transportation Needs: Not on file  Physical Activity: Not on file  Stress: Not on file  Social Connections: Not on file  Intimate Partner Violence: Not on file    FAMILY HISTORY: Family History  Problem Relation Age of Onset   Hypertension Mother    Hypertension Father    Hypertension Sister     ALLERGIES:  is allergic to benzalkonium chloride.  MEDICATIONS:  Current Outpatient Medications  Medication Sig Dispense Refill   amLODipine (NORVASC) 10 MG tablet Take 1 tablet (10 mg total) by mouth daily. 90 tablet 3   cromolyn (NASALCROM) 5.2 MG/ACT nasal spray Place 1 spray into  both nostrils 2 (two) times daily as needed for allergies.     ELIQUIS 5 MG TABS tablet TAKE 1 TABLET(5 MG) BY MOUTH TWICE DAILY (Patient taking differently: Take 5 mg by mouth 2 (two) times daily.) 180 tablet 0   Gemcitabine HCl (GEMZAR IV) Inject 1 Bag into the vein once a week. Pt gets for 30 minutes once per week     lidocaine-prilocaine (EMLA) cream Apply 1 application topically as needed. 30 g 0   lisinopril-hydrochlorothiazide (ZESTORETIC) 10-12.5 MG tablet TAKE 1 TABLET BY MOUTH DAILY 90 tablet 3   Multiple Vitamins-Minerals (PRESERVISION AREDS 2 PO) Take 1 tablet by mouth 2 (two) times daily.      ondansetron (ZOFRAN) 8 MG tablet Take 1 tablet (8 mg total) by mouth every 8 (eight) hours as needed for nausea or vomiting. 30 tablet 0   PACLitaxel Protein-Bound Part (ABRAXANE IV) Inject 1 Bag into the vein once a week. Pt gets for 30 minutes once per week     prochlorperazine (COMPAZINE) 10 MG tablet Take 1 tablet (10 mg total) by mouth every 6 (six) hours as needed for nausea or vomiting. (Patient not taking: Reported on 05/27/2021) 30 tablet 0   psyllium (METAMUCIL) 58.6 % packet Take 1 packet by mouth daily.     rosuvastatin (CRESTOR) 40 MG tablet Take 40 mg by mouth every evening.     tadalafil (CIALIS) 20 MG tablet TAKE 1 TABLET BY MOUTH EVERY DAY AS NEEDED (Patient taking differently: Take 10 mg by mouth daily as needed for erectile dysfunction.) 30 tablet 5   TRAVATAN Z 0.004 % SOLN ophthalmic solution Place 1 drop into the right eye at bedtime. [place]  12   triamcinolone (KENALOG) 0.1 % APPLY EXTERNALLY TO THE AFFECTED AREA TWICE DAILY (Patient taking differently: Apply 1 application topically daily as needed (psoriasis).) 45 g 5   No current facility-administered medications for this visit.    REVIEW OF SYSTEMS:   Constitutional: ( - ) fevers, ( - )  chills , ( - ) night sweats Eyes: ( - ) blurriness of vision, ( - ) double vision, ( - ) watery eyes Ears, nose, mouth, throat,  and face: ( - ) mucositis, ( - ) sore throat Respiratory: ( - ) cough, ( - ) dyspnea, ( - ) wheezes Cardiovascular: ( - ) palpitation, ( - ) chest discomfort, ( - ) lower extremity swelling Gastrointestinal:  ( - ) nausea, ( - ) heartburn, ( - ) change in bowel habits Skin: ( - ) abnormal skin rashes Lymphatics: ( - ) new lymphadenopathy, ( - ) easy bruising Neurological: ( - ) numbness, ( - ) tingling, ( - ) new weaknesses Behavioral/Psych: ( - ) mood change, ( - ) new changes  All other systems were reviewed with the patient and are negative.  PHYSICAL EXAMINATION: ECOG PERFORMANCE STATUS: 1 - Symptomatic but completely ambulatory  Vitals:   06/04/21  1116  BP: 110/72  Pulse: 63  Resp: 18  Temp: 98.2 F (36.8 C)  SpO2: 98%    Filed Weights   06/04/21 1116  Weight: 237 lb 12.8 oz (107.9 kg)     GENERAL: Well-appearing elderly Caucasian male, alert, no distress and comfortable SKIN: skin color, texture, turgor are normal, no rashes or significant lesions EYES: conjunctiva are pink and non-injected, sclera clear LUNGS: clear to auscultation and percussion with normal breathing effort HEART: regular rate & rhythm and no murmurs and no lower extremity edema Musculoskeletal: no cyanosis of digits and no clubbing  PSYCH: alert & oriented x 3, fluent speech NEURO: no focal motor/sensory deficits  LABORATORY DATA:  I have reviewed the data as listed CBC Latest Ref Rng & Units 06/04/2021 05/28/2021 05/26/2021  WBC 4.0 - 10.5 K/uL 6.2 3.4(L) 8.5  Hemoglobin 13.0 - 17.0 g/dL 10.1(L) 8.7(L) 11.0(L)  Hematocrit 39.0 - 52.0 % 30.3(L) 26.0(L) 33.0(L)  Platelets 150 - 400 K/uL 255 83(L) 97(L)    CMP Latest Ref Rng & Units 06/04/2021 05/28/2021 05/26/2021  Glucose 70 - 99 mg/dL 99 135(H) 137(H)  BUN 8 - 23 mg/dL 32(H) 69(H) 66(H)  Creatinine 0.61 - 1.24 mg/dL 1.61(H) 1.93(H) 2.30(H)  Sodium 135 - 145 mmol/L 140 134(L) 136  Potassium 3.5 - 5.1 mmol/L 4.2 3.7 3.7  Chloride 98 - 111 mmol/L  105 105 105  CO2 22 - 32 mmol/L 22 21(L) 20(L)  Calcium 8.9 - 10.3 mg/dL 9.2 8.5(L) 8.9  Total Protein 6.5 - 8.1 g/dL 6.8 6.1(L) 7.2  Total Bilirubin 0.3 - 1.2 mg/dL 0.9 0.5 1.3(H)  Alkaline Phos 38 - 126 U/L 190(H) 179(H) 133(H)  AST 15 - 41 U/L 79(H) 127(H) 85(H)  ALT 0 - 44 U/L 108(H) 187(H) 92(H)     RADIOGRAPHIC STUDIES: I have personally reviewed the radiological images as listed and agreed with the findings in the report. MR Brain W Wo Contrast  Addendum Date: 05/22/2021   ADDENDUM REPORT: 05/22/2021 09:50 ADDENDUM: These results were called by telephone on 05/22/2021 at 9:50 am to provider Maryellen Pile, PA, who verbally acknowledged these results. Electronically Signed   By: Merilyn Baba M.D.   On: 05/22/2021 09:50   Result Date: 05/22/2021 CLINICAL DATA:  Evaluation for metastatic disease, history of pancreatic cancer. EXAM: MRI HEAD WITHOUT AND WITH CONTRAST TECHNIQUE: Multiplanar, multiecho pulse sequences of the brain and surrounding structures were obtained without and with intravenous contrast. CONTRAST:  9m MULTIHANCE GADOBENATE DIMEGLUMINE 529 MG/ML IV SOLN COMPARISON:  None. FINDINGS: Brain: Area of encephalomalacia in the right temporal lobe, likely remote infarct. Areas of contrast enhancement within this area (series 22, image 76 and 71) correspond with areas of restricted diffusion, most likely subacute infarcts. Additional cortical enhancement in the lateral left frontal lobe (series 22, images 81-100), which also correlates with areas of restricted diffusion, also favored to represent subacute infarcts. Additional small area of encephalomalacia in the right occipital lobe (series 22, image 61), with adjacent small focus of enhancement (series 22, image 68), which does not have a definite correlate of restricted diffusion. Punctate foci and of restricted diffusion in the left parietal lobe (series 6, image 76, right parietal lobe (series 9, image 42), and bilateral  cerebellar hemispheres (series 9, image 43, without enhancing correlates, may represent more acute infarcts. Multiple punctate infarcts and T2 hyperintense foci throughout the bilateral cerebral white matter, likely the sequela of smaller, remote parenchymal infarcts and chronic small vessel ischemic disease. Lacunar infarcts in the  bilateral basal ganglia and left thalamus. Vascular: Normal flow voids. Skull and upper cervical spine: Normal marrow signal. Sinuses/Orbits: Small mucous retention cyst in the right maxillary sinus. Status post right lens replacement. Other: None. IMPRESSION: 1. Large area of encephalomalacia in the right temporal lobe, consistent with remote right MCA territory infarct. Foci of contrast enhancement within this area, which correspond to areas of restricted diffusion, are felt to reflect subacute infarcts rather than enhancing metastatic disease. Attention on follow-up. 2. Area of encephalomalacia in the right occipital lobe, likely result of remote infarct, with adjacent focus of contrast enhancement without definite correlate on restricted diffusion; this enhancing focus may reflect subacute infarct, but could also represent metastatic disease. Attention on follow-up. 3. Cortical enhancement in the lateral left frontal lobe, which correlates to an area of restricted diffusion, also favored to represent subacute infarct. 4. Additional punctate foci of restricted diffusion in the bilateral parietal lobes and cerebellar hemispheres may represent more acute infarcts. Electronically Signed: By: Merilyn Baba M.D. On: 05/22/2021 09:15   IR IMAGING GUIDED PORT INSERTION  Result Date: 05/15/2021 INDICATION: 75 year old with pancreatic cancer. Port-A-Cath needed for treatment. EXAM: FLUOROSCOPIC AND ULTRASOUND GUIDED PLACEMENT OF A SUBCUTANEOUS PORT COMPARISON:  None. MEDICATIONS: Moderate sedation ANESTHESIA/SEDATION: Versed 3.0 mg IV; Fentanyl 100 mcg IV; Moderate Sedation Time:  27  minutes The patient was continuously monitored during the procedure by the interventional radiology nurse under my direct supervision. FLUOROSCOPY TIME:  18 seconds, 7 mGy COMPLICATIONS: None immediate. PROCEDURE: The procedure, risks, benefits, and alternatives were explained to the patient. Questions regarding the procedure were encouraged and answered. The patient understands and consents to the procedure. Patient was placed supine on the interventional table. Ultrasound confirmed a patent right internal jugular vein. Ultrasound image was saved for documentation. The right chest and neck were cleaned with a skin antiseptic and a sterile drape was placed. Maximal barrier sterile technique was utilized including caps, mask, sterile gowns, sterile gloves, sterile drape, hand hygiene and skin antiseptic. The right neck was anesthetized with 1% lidocaine. Small incision was made in the right neck with a blade. Micropuncture set was placed in the right internal jugular vein with ultrasound guidance. The micropuncture wire was used for measurement purposes. The right chest was anesthetized with 1% lidocaine with epinephrine. #15 blade was used to make an incision and a subcutaneous port pocket was formed. [place] was assembled. Subcutaneous tunnel was formed with a stiff tunneling device. The port catheter was brought through the subcutaneous tunnel. The port was placed in the subcutaneous pocket. The micropuncture set was exchanged for a peel-away sheath. The catheter was placed through the peel-away sheath and the tip was positioned at the superior cavoatrial junction. Catheter placement was confirmed with fluoroscopy. The port was accessed and flushed with heparinized saline. The port pocket was closed using two layers of absorbable sutures and Dermabond. The vein skin site was closed using a single layer of absorbable suture and Dermabond. Sterile dressings were applied. Patient tolerated the procedure  well without an immediate complication. Ultrasound and fluoroscopic images were taken and saved for this procedure. IMPRESSION: Placement of a subcutaneous power-injectable port device. Catheter tip at the superior cavoatrial junction. Electronically Signed   By: Markus Daft M.D.   On: 05/15/2021 14:12    ASSESSMENT & PLAN RITHWIK SCHMIEG 75 y.o. male with medical history significant for metastatic adenocarcinoma of the pancreas who presents for a follow up visit.  After review of the labs, review of  the records, and discussion with the patient the patients findings are most consistent with metastatic adenocarcinoma the pancreas.  This is based on the pathological findings and the distribution of the lymph node involvement.  Tumor markers ordered on 05/07/2021 support the diagnosis of pancreatic primary.  Lung adenocarcinoma was on the differential but at this time without a clear lung primary I would favor metastatic pancreatic cancer.   At this time would recommend pursuing port placement with consideration of gemcitabine and Abraxane.  This regimen would consist of gemcitabine 1000 mg per metered squared IV on days 1, 8, 15 and albumin-bound paclitaxel 125 mg per metered squared on days 1, 8, 15 of a 28-day cycle.  This would be continued until intolerance or progression.  # Metastatic Adenocarcinoma of the Pancreas -- Findings at this time are most consistent with metastatic adenocarcinoma the pancreas.   -- Tumor markers collected on 05/07/2021 show an AFP of 2.7, CA 19-9 of 1870, and a CEA of 75.47 --Regimen of choice this time would be gemcitabine and Abraxane --We will order BRCA 1 and 2 mutational status to see if there are options for further therapy --Cycle 1 Day 1 of Gem/Abraxane on 05/21/2021 Plan:  --proceed with chemotherapy today --will transition to monotherapy gemcitabine alone if patient is unable to tolerate the doublet treatment --RTC 06/12/2021 for continued treatment    #Supportive Care -- chemotherapy education complete -- port placed -- zofran 19m q8H PRN and compazine 171mPO q6H for nausea -- EMLA cream for port -- no pain medication required at this time.   No orders of the defined types were placed in this encounter.   All questions were answered. The patient knows to call the clinic with any problems, questions or concerns.  A total of more than 30 minutes were spent on this encounter with face-to-face time and non-face-to-face time, including preparing to see the patient, ordering tests and/or medications, counseling the patient and coordination of care as outlined above.   JoLedell PeoplesMD Department of Hematology/Oncology [place] We[place] Springshone: [phone]: [phone]: [email or web address]  06/10/2021 9:18 AM

## 2021-06-04 NOTE — Patient Instructions (Signed)
[facility]  Discharge Instructions: Thank you for choosing [facility] to provide your oncology and hematology care.   If you have a lab appointment with the [facility], please go directly to the [facility] and check in at the registration area.   Wear comfortable clothing and clothing appropriate for easy access to any Portacath or PICC line.   We strive to give you quality time with your provider. You may need to reschedule your appointment if you arrive late (15 or more minutes).  Arriving late affects you and other patients whose appointments are after yours.  Also, if you miss three or more appointments without notifying the office, you may be dismissed from the clinic at the provider's discretion.      For prescription refill requests, have your pharmacy contact our office and allow 72 hours for refills to be completed.    Today you received the following chemotherapy and/or immunotherapy agents : Abraxane, Gemzar      To help prevent nausea and vomiting after your treatment, we encourage you to take your nausea medication as directed.  BELOW ARE SYMPTOMS THAT SHOULD BE REPORTED IMMEDIATELY: *FEVER GREATER THAN 100.4 F (38 C) OR HIGHER *CHILLS OR SWEATING *NAUSEA AND VOMITING THAT IS NOT CONTROLLED WITH YOUR NAUSEA MEDICATION *UNUSUAL SHORTNESS OF BREATH *UNUSUAL BRUISING OR BLEEDING *URINARY PROBLEMS (pain or burning when urinating, or frequent urination) *BOWEL PROBLEMS (unusual diarrhea, constipation, pain near the anus) TENDERNESS IN MOUTH AND THROAT WITH OR WITHOUT PRESENCE OF ULCERS (sore throat, sores in mouth, or a toothache) UNUSUAL RASH, SWELLING OR PAIN  UNUSUAL VAGINAL DISCHARGE OR ITCHING   Items with * indicate a potential emergency and should be followed up as soon as possible or go to the Emergency Department if any problems should occur.  Please show the CHEMOTHERAPY ALERT CARD or IMMUNOTHERAPY ALERT CARD at  check-in to the Emergency Department and triage nurse.  Should you have questions after your visit or need to cancel or reschedule your appointment, please contact [facility]  Dept: [phone]  and follow the prompts.  Office hours are 8:00 a.m. to 4:30 p.m. Monday - Friday. Please note that voicemails left after 4:00 p.m. may not be returned until the following business day.  We are closed weekends and major holidays. You have access to a nurse at all times for urgent questions. Please call the main number to the clinic Dept: [phone] and follow the prompts.   For any non-urgent questions, you may also contact your provider using MyChart. We now offer e-Visits for anyone 31 and older to request care online for non-urgent symptoms. For details visit [email or web address].   Also download the MyChart app! Go to the app store, search "MyChart", open the app, select , and log in with your MyChart username and password.  Due to Covid, a mask is required upon entering the hospital/clinic. If you do not have a mask, one will be given to you upon arrival. For doctor visits, patients may have 1 support person aged 59 or older with them. For treatment visits, patients cannot have anyone with them due to current Covid guidelines and our immunocompromised population.

## 2021-06-04 NOTE — Progress Notes (Addendum)
Confirmed w/ Dr. Lorenso Courier that today is C1D15.  Continuing full dose. Orders within treatment plan updated w/ correct cycle & day.  Kennith Center, Pharm.D., CPP 06/04/2021'@12'$ :10 PM

## 2021-06-10 ENCOUNTER — Encounter: Payer: Self-pay | Admitting: Hematology and Oncology

## 2021-06-11 ENCOUNTER — Other Ambulatory Visit: Payer: Self-pay | Admitting: Family Medicine

## 2021-06-11 DIAGNOSIS — I10 Essential (primary) hypertension: Secondary | ICD-10-CM

## 2021-06-12 ENCOUNTER — Inpatient Hospital Stay: Payer: PPO

## 2021-06-12 ENCOUNTER — Other Ambulatory Visit: Payer: Self-pay

## 2021-06-12 ENCOUNTER — Inpatient Hospital Stay: Payer: PPO | Admitting: Dietician

## 2021-06-12 ENCOUNTER — Inpatient Hospital Stay: Payer: PPO | Admitting: Hematology and Oncology

## 2021-06-12 VITALS — BP 98/64 | HR 86 | Temp 95.7°F | Resp 19 | Ht 73.0 in | Wt 235.6 lb

## 2021-06-12 DIAGNOSIS — C259 Malignant neoplasm of pancreas, unspecified: Secondary | ICD-10-CM

## 2021-06-12 DIAGNOSIS — Z95828 Presence of other vascular implants and grafts: Secondary | ICD-10-CM | POA: Diagnosis not present

## 2021-06-12 DIAGNOSIS — C772 Secondary and unspecified malignant neoplasm of intra-abdominal lymph nodes: Secondary | ICD-10-CM | POA: Diagnosis not present

## 2021-06-12 DIAGNOSIS — Z5111 Encounter for antineoplastic chemotherapy: Secondary | ICD-10-CM | POA: Diagnosis not present

## 2021-06-12 LAB — MAGNESIUM: Magnesium: 1.9 mg/dL (ref 1.7–2.4)

## 2021-06-12 LAB — CBC WITH DIFFERENTIAL (CANCER CENTER ONLY)
Abs Immature Granulocytes: 0.16 10*3/uL — ABNORMAL HIGH (ref 0.00–0.07)
Basophils Absolute: 0 10*3/uL (ref 0.0–0.1)
Basophils Relative: 1 %
Eosinophils Absolute: 0.1 10*3/uL (ref 0.0–0.5)
Eosinophils Relative: 3 %
HCT: 26.4 % — ABNORMAL LOW (ref 39.0–52.0)
Hemoglobin: 8.8 g/dL — ABNORMAL LOW (ref 13.0–17.0)
Immature Granulocytes: 5 %
Lymphocytes Relative: 24 %
Lymphs Abs: 0.9 10*3/uL (ref 0.7–4.0)
MCH: 32.7 pg (ref 26.0–34.0)
MCHC: 33.3 g/dL (ref 30.0–36.0)
MCV: 98.1 fL (ref 80.0–100.0)
Monocytes Absolute: 0.3 10*3/uL (ref 0.1–1.0)
Monocytes Relative: 9 %
Neutro Abs: 2.1 10*3/uL (ref 1.7–7.7)
Neutrophils Relative %: 58 %
Platelet Count: 174 10*3/uL (ref 150–400)
RBC: 2.69 MIL/uL — ABNORMAL LOW (ref 4.22–5.81)
RDW: 15.9 % — ABNORMAL HIGH (ref 11.5–15.5)
WBC Count: 3.6 10*3/uL — ABNORMAL LOW (ref 4.0–10.5)
nRBC: 3.1 % — ABNORMAL HIGH (ref 0.0–0.2)

## 2021-06-12 LAB — CMP (CANCER CENTER ONLY)
ALT: 172 U/L — ABNORMAL HIGH (ref 0–44)
AST: 127 U/L — ABNORMAL HIGH (ref 15–41)
Albumin: 2.9 g/dL — ABNORMAL LOW (ref 3.5–5.0)
Alkaline Phosphatase: 190 U/L — ABNORMAL HIGH (ref 38–126)
Anion gap: 10 (ref 5–15)
BUN: 29 mg/dL — ABNORMAL HIGH (ref 8–23)
CO2: 22 mmol/L (ref 22–32)
Calcium: 9 mg/dL (ref 8.9–10.3)
Chloride: 109 mmol/L (ref 98–111)
Creatinine: 1.59 mg/dL — ABNORMAL HIGH (ref 0.61–1.24)
GFR, Estimated: 46 mL/min — ABNORMAL LOW (ref >60)
Glucose, Bld: 103 mg/dL — ABNORMAL HIGH (ref 70–99)
Potassium: 4.2 mmol/L (ref 3.5–5.1)
Sodium: 141 mmol/L (ref 135–145)
Total Bilirubin: 0.7 mg/dL (ref 0.3–1.2)
Total Protein: 6.3 g/dL — ABNORMAL LOW (ref 6.5–8.1)

## 2021-06-12 MED ORDER — SODIUM CHLORIDE 0.9 % IV SOLN
1000.0000 mg/m2 | Freq: Once | INTRAVENOUS | Status: AC
  Administered 2021-06-12: 2432 mg via INTRAVENOUS
  Filled 2021-06-12: qty 63.96

## 2021-06-12 MED ORDER — SODIUM CHLORIDE 0.9% FLUSH
10.0000 mL | INTRAVENOUS | Status: DC | PRN
  Administered 2021-06-12: 10 mL

## 2021-06-12 MED ORDER — LORATADINE 10 MG PO TABS
10.0000 mg | ORAL_TABLET | Freq: Every day | ORAL | Status: DC
  Administered 2021-06-12: 10 mg via ORAL
  Filled 2021-06-12: qty 1

## 2021-06-12 MED ORDER — SODIUM CHLORIDE 0.9 % IV SOLN
10.0000 mg | Freq: Once | INTRAVENOUS | Status: AC
  Administered 2021-06-12: 10 mg via INTRAVENOUS
  Filled 2021-06-12: qty 10

## 2021-06-12 MED ORDER — HEPARIN SOD (PORK) LOCK FLUSH 100 UNIT/ML IV SOLN
500.0000 [IU] | Freq: Once | INTRAVENOUS | Status: AC | PRN
  Administered 2021-06-12: 500 [IU]

## 2021-06-12 MED ORDER — ACETAMINOPHEN 500 MG PO TABS
1000.0000 mg | ORAL_TABLET | Freq: Once | ORAL | Status: AC
  Administered 2021-06-12: 1000 mg via ORAL
  Filled 2021-06-12: qty 2

## 2021-06-12 MED ORDER — SODIUM CHLORIDE 0.9% FLUSH
10.0000 mL | Freq: Once | INTRAVENOUS | Status: AC
  Administered 2021-06-12: 10 mL

## 2021-06-12 MED ORDER — ONDANSETRON HCL 4 MG/2ML IJ SOLN
8.0000 mg | Freq: Once | INTRAMUSCULAR | Status: AC
  Administered 2021-06-12: 8 mg via INTRAVENOUS
  Filled 2021-06-12: qty 4

## 2021-06-12 MED ORDER — FAMOTIDINE 20 MG IN NS 100 ML IVPB
20.0000 mg | Freq: Once | INTRAVENOUS | Status: AC
  Administered 2021-06-12: 20 mg via INTRAVENOUS
  Filled 2021-06-12: qty 100

## 2021-06-12 MED ORDER — PACLITAXEL PROTEIN-BOUND CHEMO INJECTION 100 MG
125.0000 mg/m2 | Freq: Once | INTRAVENOUS | Status: AC
  Administered 2021-06-12: 300 mg via INTRAVENOUS
  Filled 2021-06-12: qty 60

## 2021-06-12 MED ORDER — SODIUM CHLORIDE 0.9 % IV SOLN: Freq: Once | INTRAVENOUS | Status: AC

## 2021-06-12 NOTE — Progress Notes (Signed)
OK to treat with elevated Creatine and elevated LFT's per Dr. Lorenso Courier

## 2021-06-12 NOTE — Progress Notes (Signed)
Nutrition Assessment   Reason for Assessment: MST (+weight loss, poor appetite)    ASSESSMENT: 75 year old male with metastatic pancreatic cancer. He is receiving chemotherapy with Gemcitabine/Abraxane. Patient followed by Dr. Lorenso Courier.   Past medical history includes OSA, HTN, erythrocytosis, obeisty  Met with patient during infusion to introduce self and services available at [facility]. Patient agreeable to visit, however patient engages little in conversation and provides limited nutrition history. Patient reports decreased appetite, says foods do not taste the same. Patient is unable to provide dietary recall. He reports he has not eaten today, says his wife is currently at the store "getting stuff" He is drinking 1-2 Ensure daily. Patient declines RD offer of supplement during infusion. Patient denies nausea, says he is taking medication for this and it must be working. Patient reports diarrhea after first chemotherapy, states it was bad. This has improved, reports some loose stools on occasion.   Nutrition Focused Physical Exam: unable to complete at this time   Medications: zofran, compazine, metamucil   Labs: Glucose 103, BUN 29, Cr 1.59, AST 127, ALT 172   Anthropometrics: Weights have decreased 48 lbs (17%) from usual weight in 8 months. This is significant.  Height: 6'1" Weight: 235 lb 9.6 oz today UBW: 283 lb (10/07/20) BMI: 31.08   NUTRITION DIAGNOSIS: Unintentional weight loss related to cancer and associated treatments as evidenced by reported decreased appetite and 13 lb (5.2%) weight loss in 2 weeks. This is significant    INTERVENTION:  Encouraged eating smaller meals and snacks more frequently with adequate calories and protein, handout with tips for poor appetite and high calorie, high protein snack ideas provided Educated on strategies for altered taste, handout provided Discussed tips for diarrhea, foods to limit and foods best tolerated - handout provided Encouraged  drinking nutrition supplements, recommended 2-3 Ensure Plus/equivalent daily - coupons provided Contact information provided     MONITORING, EVALUATION, GOAL: Patient will tolerate increased calories and protein to promote weight stability   Next Visit: Thursday September 22 during infusion with Pamala Hurry

## 2021-06-12 NOTE — Patient Instructions (Signed)
[facility] ONCOLOGY  Discharge Instructions: Thank you for choosing [facility] to provide your oncology and hematology care.   If you have a lab appointment with the [facility], please go directly to the [facility] and check in at the registration area.   Wear comfortable clothing and clothing appropriate for easy access to any Portacath or PICC line.   We strive to give you quality time with your provider. You may need to reschedule your appointment if you arrive late (15 or more minutes).  Arriving late affects you and other patients whose appointments are after yours.  Also, if you miss three or more appointments without notifying the office, you may be dismissed from the clinic at the provider's discretion.      For prescription refill requests, have your pharmacy contact our office and allow 72 hours for refills to be completed.    Today you received the following chemotherapy and/or immunotherapy agents : Abraxane, Gemzar      To help prevent nausea and vomiting after your treatment, we encourage you to take your nausea medication as directed.  BELOW ARE SYMPTOMS THAT SHOULD BE REPORTED IMMEDIATELY: *FEVER GREATER THAN 100.4 F (38 C) OR HIGHER *CHILLS OR SWEATING *NAUSEA AND VOMITING THAT IS NOT CONTROLLED WITH YOUR NAUSEA MEDICATION *UNUSUAL SHORTNESS OF BREATH *UNUSUAL BRUISING OR BLEEDING *URINARY PROBLEMS (pain or burning when urinating, or frequent urination) *BOWEL PROBLEMS (unusual diarrhea, constipation, pain near the anus) TENDERNESS IN MOUTH AND THROAT WITH OR WITHOUT PRESENCE OF ULCERS (sore throat, sores in mouth, or a toothache) UNUSUAL RASH, SWELLING OR PAIN  UNUSUAL VAGINAL DISCHARGE OR ITCHING   Items with * indicate a potential emergency and should be followed up as soon as possible or go to the Emergency Department if any problems should occur.  Please show the CHEMOTHERAPY ALERT CARD or IMMUNOTHERAPY ALERT CARD at  check-in to the Emergency Department and triage nurse.  Should you have questions after your visit or need to cancel or reschedule your appointment, please contact [facility]  Dept: [phone]  and follow the prompts.  Office hours are 8:00 a.m. to 4:30 p.m. Monday - Friday. Please note that voicemails left after 4:00 p.m. may not be returned until the following business day.  We are closed weekends and major holidays. You have access to a nurse at all times for urgent questions. Please call the main number to the clinic Dept: [phone] and follow the prompts.   For any non-urgent questions, you may also contact your provider using MyChart. We now offer e-Visits for anyone 73 and older to request care online for non-urgent symptoms. For details visit [email or web address].   Also download the MyChart app! Go to the app store, search "MyChart", open the app, select [facility], and log in with your MyChart username and password.  Due to Covid, a mask is required upon entering the hospital/clinic. If you do not have a mask, one will be given to you upon arrival. For doctor visits, patients may have 1 support person aged 73 or older with them. For treatment visits, patients cannot have anyone with them due to current Covid guidelines and our immunocompromised population.

## 2021-06-18 ENCOUNTER — Other Ambulatory Visit: Payer: Self-pay | Admitting: Family Medicine

## 2021-06-18 DIAGNOSIS — I482 Chronic atrial fibrillation, unspecified: Secondary | ICD-10-CM

## 2021-06-18 DIAGNOSIS — I10 Essential (primary) hypertension: Secondary | ICD-10-CM

## 2021-06-19 ENCOUNTER — Other Ambulatory Visit: Payer: Self-pay | Admitting: Hematology and Oncology

## 2021-06-23 ENCOUNTER — Encounter: Payer: Self-pay | Admitting: Hematology and Oncology

## 2021-06-23 NOTE — Progress Notes (Signed)
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 Found to have enlargement of left supraclavicular lymph node with diffuse mediastinal and upper abdominal lymph nodes. 05/01/2021: [place] core biopsy of left supraclavicular lymph node. Findings consistent with metastatic adenocarcinoma. Lung vs pancreatic origin 05/06/2021: CT abdomen performed. Formal read pending. 05/07/2021: establish care with Dr. Lorenso Courier  05/21/2021: Cycle 1 Day 1 of Gem/Abraxane 05/28/2021: Cycle 1 Day 8 HELD due to cytopenias and poor renal function 06/12/2021: Cycle 1 Day 22 of Gem/Abraxane (extra week due to missed week on 05/28/2021) 06/25/2021: anticipated Cycle 2 Day 1 of Gem/Abraxane  Interval History:  Lee Kim 75 y.o. male with medical history significant for metastatic adenocarcinoma of the pancreas who presents for a follow up visit. The patient's last visit was on 06/04/2021. In the interim since the last visit he continued on Gem/Abraxane.  On exam today Lee Kim reports that he has been having trouble with explosive diarrhea.  Culture control and that he typically has 1 big episode per day.  He has been responding well to Imodium.  He fortunately has not been having any issues with nausea or vomiting.  The nausea medications were quite well and he thinks that the first sign of queasiness.  He denies any fevers, chills, sweats.  He does endorse having shortness of breath on occasion but no chest pain or pressure.  He is otherwise tolerating chemotherapy  treatment well with no other overt side effects.  A  10 point ROS is listed below.   MEDICAL HISTORY:  Past Medical History:  Diagnosis Date   Colonic polyp    Diverticulitis    Erythrocytosis    Hypertension    Obesity    OSA (obstructive sleep apnea)     SURGICAL HISTORY: Past Surgical History:  Procedure Laterality Date   COLONOSCOPY  03/2003   Dr. Wynetta Emery   COLONOSCOPY WITH PROPOFOL N/A 11/23/2016   Procedure: COLONOSCOPY WITH PROPOFOL;  Surgeon: Garlan Fair, MD;  Location: [facility];  Service: Endoscopy;  Laterality: N/A;   EYE SURGERY     IR IMAGING GUIDED PORT INSERTION  05/15/2021    SOCIAL HISTORY: Social History   Socioeconomic History   Marital status: Married    Spouse name: Not on file   Number of children: 1   Years of education: Not on file   Highest education level: Not on file  Occupational History   Not on file  Tobacco Use   Smoking status: Every Day    Types: Cigarettes   Smokeless tobacco: Never  Vaping Use   Vaping Use: Never used  Substance and Sexual Activity   Alcohol use: Yes    Alcohol/week: 7.0 standard drinks    Types: 7 Glasses of wine per week    Comment: 3-4 glasses wine per night   Drug use: No   Sexual activity: Yes  Other Topics Concern   Not on file  Social History Narrative   Not on file   Social Determinants of Health   Financial Resource Strain: Not on file  Food Insecurity: Not on file  Transportation Needs: Not on file  Physical Activity: Not on file  Stress: Not on file  Social Connections: Not on file  Intimate Partner Violence: Not on file    FAMILY HISTORY: Family History  Problem Relation Age of Onset   Hypertension Mother    Hypertension Father    Hypertension Sister     ALLERGIES:  is allergic to benzalkonium chloride.  MEDICATIONS:  Current Outpatient Medications  Medication Sig Dispense Refill   amLODipine (NORVASC) 10 MG tablet TAKE 1 TABLET(10 MG) BY MOUTH DAILY 90 tablet 0    cromolyn (NASALCROM) 5.2 MG/ACT nasal spray Place 1 spray into both nostrils 2 (two) times daily as needed for allergies.     ELIQUIS 5 MG TABS tablet TAKE 1 TABLET(5 MG) BY MOUTH TWICE DAILY 180 tablet 0   Gemcitabine HCl (GEMZAR IV) Inject 1 Bag into the vein once a week. Pt gets for 30 minutes once per week     lidocaine-prilocaine (EMLA) cream Apply 1 application topically as needed. 30 g 0   lisinopril-hydrochlorothiazide (ZESTORETIC) 10-12.5 MG tablet TAKE 1 TABLET BY MOUTH DAILY 90 tablet 0   Multiple Vitamins-Minerals (PRESERVISION AREDS 2 PO) Take 1 tablet by mouth 2 (two) times daily.      ondansetron (ZOFRAN) 8 MG tablet TAKE 1 TABLET(8 MG) BY MOUTH EVERY 8 HOURS AS NEEDED FOR NAUSEA OR VOMITING 30 tablet 0   PACLitaxel Protein-Bound Part (ABRAXANE IV) Inject 1 Bag into the vein once a week. Pt gets for 30 minutes once per week     prochlorperazine (COMPAZINE) 10 MG tablet TAKE 1 TABLET(10 MG) BY MOUTH EVERY 6 HOURS AS NEEDED FOR NAUSEA OR VOMITING 30 tablet 0   psyllium (METAMUCIL) 58.6 % packet Take 1 packet by mouth daily.     rosuvastatin (CRESTOR) 40 MG tablet Take 40 mg by mouth every evening.     tadalafil (CIALIS) 20 MG tablet TAKE 1 TABLET BY MOUTH EVERY DAY AS NEEDED (Patient taking differently: Take 10 mg by mouth daily as needed for erectile dysfunction.) 30 tablet 5   TRAVATAN Z 0.004 % SOLN ophthalmic solution Place 1 drop into the right eye at bedtime. [place]  12   triamcinolone (KENALOG) 0.1 % APPLY EXTERNALLY TO THE AFFECTED AREA TWICE DAILY (Patient taking differently: Apply 1 application topically daily as needed (psoriasis).) 45 g 5   No current facility-administered medications for this visit.    REVIEW OF SYSTEMS:   Constitutional: ( - ) fevers, ( - )  chills , ( - ) night sweats Eyes: ( - ) blurriness of vision, ( - ) double vision, ( - ) watery eyes Ears, nose, mouth, throat, and face: ( - ) mucositis, ( - ) sore throat Respiratory: ( - ) cough, ( - )  dyspnea, ( - ) wheezes Cardiovascular: ( - ) palpitation, ( - ) chest discomfort, ( - ) lower extremity swelling Gastrointestinal:  ( - ) nausea, ( - ) heartburn, ( - ) change in bowel habits Skin: ( - ) abnormal skin rashes Lymphatics: ( - ) new lymphadenopathy, ( - ) easy bruising Neurological: ( - ) numbness, ( - ) tingling, ( - ) new weaknesses Behavioral/Psych: ( - ) mood change, ( - ) new changes  All other systems were reviewed with the patient and are negative.  PHYSICAL EXAMINATION: ECOG PERFORMANCE STATUS: 1 - Symptomatic but completely ambulatory  Vitals:   06/12/21 1010  BP: 98/64  Pulse: 86  Resp: 19  Temp: (!) 95.7 F (35.4 C)  SpO2: 100%  Filed Weights   06/12/21 1010  Weight: 235 lb 9.6 oz (106.9 kg)    GENERAL: Well-appearing elderly Caucasian male, alert, no distress and comfortable SKIN: skin color, texture, turgor are normal, no rashes or significant lesions EYES: conjunctiva are pink and non-injected, sclera clear LUNGS: clear to auscultation and percussion with normal breathing effort HEART: regular rate & rhythm and no murmurs and no lower extremity edema Musculoskeletal: no cyanosis of digits and no clubbing  PSYCH: alert & oriented x 3, fluent speech NEURO: no focal motor/sensory deficits  LABORATORY DATA:  I have reviewed the data as listed CBC Latest Ref Rng & Units 06/12/2021 06/04/2021 05/28/2021  WBC 4.0 - 10.5 K/uL 3.6(L) 6.2 3.4(L)  Hemoglobin 13.0 - 17.0 g/dL 8.8(L) 10.1(L) 8.7(L)  Hematocrit 39.0 - 52.0 % 26.4(L) 30.3(L) 26.0(L)  Platelets 150 - 400 K/uL 174 255 83(L)    CMP Latest Ref Rng & Units 06/12/2021 06/04/2021 05/28/2021  Glucose 70 - 99 mg/dL 103(H) 99 135(H)  BUN 8 - 23 mg/dL 29(H) 32(H) 69(H)  Creatinine 0.61 - 1.24 mg/dL 1.59(H) 1.61(H) 1.93(H)  Sodium 135 - 145 mmol/L 141 140 134(L)  Potassium 3.5 - 5.1 mmol/L 4.2 4.2 3.7  Chloride 98 - 111 mmol/L 109 105 105  CO2 22 - 32 mmol/L 22 22 21(L)  Calcium 8.9 - 10.3 mg/dL 9.0 9.2  8.5(L)  Total Protein 6.5 - 8.1 g/dL 6.3(L) 6.8 6.1(L)  Total Bilirubin 0.3 - 1.2 mg/dL 0.7 0.9 0.5  Alkaline Phos 38 - 126 U/L 190(H) 190(H) 179(H)  AST 15 - 41 U/L 127(H) 79(H) 127(H)  ALT 0 - 44 U/L 172(H) 108(H) 187(H)    RADIOGRAPHIC STUDIES: I have personally reviewed the radiological images as listed and agreed with the findings in the report. No results found.  ASSESSMENT & PLAN Lee Kim 75 y.o. male with medical history significant for metastatic adenocarcinoma of the pancreas who presents for a follow up visit.  After review of the labs, review of the records, and discussion with the patient the patients findings are most consistent with metastatic adenocarcinoma the pancreas.  This is based on the pathological findings and the distribution of the lymph node involvement.  Tumor markers ordered on 05/07/2021 support the diagnosis of pancreatic primary.  Lung adenocarcinoma was on the differential but at this time without a clear lung primary I would favor metastatic pancreatic cancer.   At this time would recommend pursuing port placement with consideration of gemcitabine and Abraxane.  This regimen would consist of gemcitabine 1000 mg per metered squared IV on days 1, 8, 15 and albumin-bound paclitaxel 125 mg per metered squared on days 1, 8, 15 of a 28-day cycle.  This would be continued until intolerance or progression.  # Metastatic Adenocarcinoma of the Pancreas -- Findings at this time are most consistent with metastatic adenocarcinoma the pancreas.   -- Tumor markers collected on 05/07/2021 show an AFP of 2.7, CA 19-9 of 1870, and a CEA of 75.47 --Regimen of choice this time would be gemcitabine and Abraxane --We will order BRCA 1 and 2 mutational status to see if there are options for further therapy --Cycle 1 Day 1 of Gem/Abraxane on 05/21/2021 Plan:  --proceed with chemotherapy today --will transition to monotherapy gemcitabine alone if patient is unable to tolerate  the doublet treatment --RTC 06/25/2021 for continued treatment   #Supportive Care -- chemotherapy education complete -- port placed -- zofran 36m q8H PRN and compazine 157mPO q6H for nausea -- EMLA cream  for port -- no pain medication required at this time.   No orders of the defined types were placed in this encounter.   All questions were answered. The patient knows to call the clinic with any problems, questions or concerns.  A total of more than 30 minutes were spent on this encounter with face-to-face time and non-face-to-face time, including preparing to see the patient, ordering tests and/or medications, counseling the patient and coordination of care as outlined above.   Ledell Peoples, MD Department of Hematology/Oncology [facility] Phone: [phone] Pager: [phone] Email: [email or web address]  06/23/2021 1:57 PM

## 2021-06-24 MED FILL — Dexamethasone Sodium Phosphate Inj 100 MG/10ML: INTRAMUSCULAR | Qty: 1 | Status: AC

## 2021-06-25 ENCOUNTER — Other Ambulatory Visit: Payer: Self-pay

## 2021-06-25 ENCOUNTER — Inpatient Hospital Stay: Payer: PPO

## 2021-06-25 ENCOUNTER — Inpatient Hospital Stay: Payer: PPO | Admitting: Nutrition

## 2021-06-25 ENCOUNTER — Inpatient Hospital Stay (HOSPITAL_BASED_OUTPATIENT_CLINIC_OR_DEPARTMENT_OTHER): Payer: PPO | Admitting: Hematology and Oncology

## 2021-06-25 VITALS — BP 120/72 | HR 77 | Temp 97.5°F | Resp 18 | Ht 73.0 in | Wt 237.3 lb

## 2021-06-25 DIAGNOSIS — D6481 Anemia due to antineoplastic chemotherapy: Secondary | ICD-10-CM

## 2021-06-25 DIAGNOSIS — F17210 Nicotine dependence, cigarettes, uncomplicated: Secondary | ICD-10-CM | POA: Diagnosis not present

## 2021-06-25 DIAGNOSIS — Z95828 Presence of other vascular implants and grafts: Secondary | ICD-10-CM | POA: Diagnosis not present

## 2021-06-25 DIAGNOSIS — C259 Malignant neoplasm of pancreas, unspecified: Secondary | ICD-10-CM | POA: Diagnosis not present

## 2021-06-25 DIAGNOSIS — C772 Secondary and unspecified malignant neoplasm of intra-abdominal lymph nodes: Secondary | ICD-10-CM

## 2021-06-25 DIAGNOSIS — T451X5A Adverse effect of antineoplastic and immunosuppressive drugs, initial encounter: Secondary | ICD-10-CM

## 2021-06-25 DIAGNOSIS — Z5111 Encounter for antineoplastic chemotherapy: Secondary | ICD-10-CM | POA: Diagnosis not present

## 2021-06-25 LAB — CBC WITH DIFFERENTIAL (CANCER CENTER ONLY)
Abs Immature Granulocytes: 0.27 10*3/uL — ABNORMAL HIGH (ref 0.00–0.07)
Basophils Absolute: 0.1 10*3/uL (ref 0.0–0.1)
Basophils Relative: 1 %
Eosinophils Absolute: 0.2 10*3/uL (ref 0.0–0.5)
Eosinophils Relative: 3 %
HCT: 26.7 % — ABNORMAL LOW (ref 39.0–52.0)
Hemoglobin: 8.8 g/dL — ABNORMAL LOW (ref 13.0–17.0)
Immature Granulocytes: 5 %
Lymphocytes Relative: 13 %
Lymphs Abs: 0.7 10*3/uL (ref 0.7–4.0)
MCH: 34.5 pg — ABNORMAL HIGH (ref 26.0–34.0)
MCHC: 33 g/dL (ref 30.0–36.0)
MCV: 104.7 fL — ABNORMAL HIGH (ref 80.0–100.0)
Monocytes Absolute: 1.5 10*3/uL — ABNORMAL HIGH (ref 0.1–1.0)
Monocytes Relative: 29 %
Neutro Abs: 2.5 10*3/uL (ref 1.7–7.7)
Neutrophils Relative %: 49 %
Platelet Count: 209 10*3/uL (ref 150–400)
RBC: 2.55 MIL/uL — ABNORMAL LOW (ref 4.22–5.81)
RDW: 22.3 % — ABNORMAL HIGH (ref 11.5–15.5)
WBC Count: 5.2 10*3/uL (ref 4.0–10.5)
nRBC: 0.6 % — ABNORMAL HIGH (ref 0.0–0.2)

## 2021-06-25 LAB — CMP (CANCER CENTER ONLY)
ALT: 51 U/L — ABNORMAL HIGH (ref 0–44)
AST: 34 U/L (ref 15–41)
Albumin: 2.7 g/dL — ABNORMAL LOW (ref 3.5–5.0)
Alkaline Phosphatase: 197 U/L — ABNORMAL HIGH (ref 38–126)
Anion gap: 11 (ref 5–15)
BUN: 18 mg/dL (ref 8–23)
CO2: 21 mmol/L — ABNORMAL LOW (ref 22–32)
Calcium: 8.6 mg/dL — ABNORMAL LOW (ref 8.9–10.3)
Chloride: 108 mmol/L (ref 98–111)
Creatinine: 1.45 mg/dL — ABNORMAL HIGH (ref 0.61–1.24)
GFR, Estimated: 51 mL/min — ABNORMAL LOW (ref >60)
Glucose, Bld: 89 mg/dL (ref 70–99)
Potassium: 3.8 mmol/L (ref 3.5–5.1)
Sodium: 140 mmol/L (ref 135–145)
Total Bilirubin: 0.8 mg/dL (ref 0.3–1.2)
Total Protein: 5.9 g/dL — ABNORMAL LOW (ref 6.5–8.1)

## 2021-06-25 LAB — MAGNESIUM: Magnesium: 1.8 mg/dL (ref 1.7–2.4)

## 2021-06-25 MED ORDER — ONDANSETRON HCL 4 MG/2ML IJ SOLN: 8.0000 mg | Freq: Once | INTRAMUSCULAR | Status: AC

## 2021-06-25 MED ORDER — ACETAMINOPHEN 500 MG PO TABS
ORAL_TABLET | ORAL | Status: AC
  Administered 2021-06-25: 1000 mg via ORAL
  Filled 2021-06-25: qty 2

## 2021-06-25 MED ORDER — ACETAMINOPHEN 500 MG PO TABS: 1000.0000 mg | ORAL_TABLET | Freq: Once | ORAL | Status: AC

## 2021-06-25 MED ORDER — SODIUM CHLORIDE 0.9 % IV SOLN
1000.0000 mg/m2 | Freq: Once | INTRAVENOUS | Status: AC
  Administered 2021-06-25: 2432 mg via INTRAVENOUS
  Filled 2021-06-25: qty 63.96

## 2021-06-25 MED ORDER — SODIUM CHLORIDE 0.9 % IV SOLN
10.0000 mg | Freq: Once | INTRAVENOUS | Status: AC
  Administered 2021-06-25: 10 mg via INTRAVENOUS
  Filled 2021-06-25: qty 10

## 2021-06-25 MED ORDER — SODIUM CHLORIDE 0.9% FLUSH
10.0000 mL | Freq: Once | INTRAVENOUS | Status: AC
  Administered 2021-06-25: 10 mL

## 2021-06-25 MED ORDER — FAMOTIDINE 20 MG IN NS 100 ML IVPB: 20.0000 mg | Freq: Once | INTRAVENOUS | Status: AC

## 2021-06-25 MED ORDER — SODIUM CHLORIDE 0.9% FLUSH
10.0000 mL | INTRAVENOUS | Status: DC | PRN
  Administered 2021-06-25: 10 mL

## 2021-06-25 MED ORDER — SODIUM CHLORIDE 0.9 % IV SOLN: Freq: Once | INTRAVENOUS | Status: AC

## 2021-06-25 MED ORDER — PACLITAXEL PROTEIN-BOUND CHEMO INJECTION 100 MG
125.0000 mg/m2 | Freq: Once | INTRAVENOUS | Status: AC
  Administered 2021-06-25: 300 mg via INTRAVENOUS
  Filled 2021-06-25: qty 60

## 2021-06-25 MED ORDER — LORATADINE 10 MG PO TABS: 10.0000 mg | ORAL_TABLET | Freq: Every day | ORAL | Status: DC

## 2021-06-25 MED ORDER — ONDANSETRON HCL 4 MG/2ML IJ SOLN
INTRAMUSCULAR | Status: AC
  Administered 2021-06-25: 8 mg via INTRAVENOUS
  Filled 2021-06-25: qty 4

## 2021-06-25 MED ORDER — LORATADINE 10 MG PO TABS
ORAL_TABLET | ORAL | Status: AC
  Administered 2021-06-25: 10 mg via ORAL
  Filled 2021-06-25: qty 1

## 2021-06-25 MED ORDER — HEPARIN SOD (PORK) LOCK FLUSH 100 UNIT/ML IV SOLN
500.0000 [IU] | Freq: Once | INTRAVENOUS | Status: AC | PRN
  Administered 2021-06-25: 500 [IU]

## 2021-06-25 MED ORDER — FAMOTIDINE 20 MG IN NS 100 ML IVPB
INTRAVENOUS | Status: AC
  Administered 2021-06-25: 20 mg via INTRAVENOUS
  Filled 2021-06-25: qty 100

## 2021-06-25 NOTE — Progress Notes (Signed)
Nutrition follow-up completed with patient diagnosed with metastatic pancreas cancer receiving chemotherapy and followed by Dr. Lorenso Courier.  Weight improved and documented as 237.3 pounds on September 22.  This is increased from 235.6 pounds September 9. Patient denies nausea and vomiting. He has had an explosive diarrheal stool x1.  This is improved with Imodium. Noted labs: Creatinine 1.45 and albumin 2.7. He reports he drinks Ensure 1-2 cartons daily.  He is requesting coupons.  Nutrition diagnosis: Unintentional weight loss improved.  Intervention: Continue small frequent meals and snacks with adequate calories and protein. Oral nutrition supplements as needed. Coupons provided. Support and encouragement provided.  Monitoring, evaluation, goals: Patient will tolerate adequate calories protein to minimize weight loss.  Next visit: No follow-up scheduled.  Please refer to RD if nutrition problems identified.  **Disclaimer: This note was dictated with voice recognition software. Similar sounding words can inadvertently be transcribed and this note may contain transcription errors which may not have been corrected upon publication of note.**

## 2021-06-25 NOTE — Progress Notes (Signed)
[place] [phone]) [phone]   [phone]  PROGRESS NOTE  Patient Care Team: [name], MD as PCP - General (Family Medicine) Jacolyn Reedy, MD as Consulting Physician (Cardiology) Orson Slick, MD as Consulting Physician (Oncology) Royston Bake, RN as Oncology Nurse Navigator (Oncology)  Hematological/Oncological History # Pancreatic Adenocarcinoma, Metastatic 03/31/2021: patient underwent routine CT angio chest for monitoring of his thoracic aortic aneurysm. Found to have enlargement of left supraclavicular lymph node with diffuse mediastinal and upper abdominal lymph nodes. 05/01/2021: [place] core biopsy of left supraclavicular lymph node. Findings consistent with metastatic adenocarcinoma. Lung vs pancreatic origin 05/06/2021: CT abdomen performed. Formal read pending. 05/07/2021: establish care with Dr. Lorenso Courier  05/21/2021: Cycle 1 Day 1 of Gem/Abraxane 05/28/2021: Cycle 1 Day 8 HELD due to cytopenias and poor renal function 06/12/2021: Cycle 1 Day 22 of Gem/Abraxane (extra week due to missed week on 05/28/2021) 06/25/2021: Cycle 2 Day 1 of Gem/Abraxane  Interval History:  Lee Kim 75 y.o. male with medical history significant for metastatic adenocarcinoma of the pancreas who presents for a follow up visit. The patient's last visit was on 06/12/2021. In the interim since the last visit he continued on Gem/Abraxane.  On exam today Lee Kim reports he has been having some issues with shortness of breath and fatigue and can get short of breath walking from room to room.  He also reports that his legs are weaker than they were previously.  His appetite has been poor and he is only been eating 1-1/2 meals per day.  He has been drinking Ensure and his weight has been stable.  He reports he does have nausea but is well controlled as he takes nausea medication at the first signs of queasiness.  He reports that he does continue to have some loose  watery stools but that Imodium is effective in slowing this down.  He is also been taking Metamucil to help bulk up his stools.  Endorses having fatigue and taking frequent naps but otherwise is tolerating treatment relatively well.  He denies any fevers, chills, sweats.  He does endorse having shortness of breath on occasion but no chest pain or pressure.   A  10 point ROS is listed below.   MEDICAL HISTORY:  Past Medical History:  Diagnosis Date   Colonic polyp    Diverticulitis    Erythrocytosis    Hypertension    Obesity    OSA (obstructive sleep apnea)     SURGICAL HISTORY: Past Surgical History:  Procedure Laterality Date   COLONOSCOPY  03/2003   Dr. Wynetta Emery   COLONOSCOPY WITH PROPOFOL N/A 11/23/2016   Procedure: COLONOSCOPY WITH PROPOFOL;  Surgeon: Garlan Fair, MD;  Location: [facility];  Service: Endoscopy;  Laterality: N/A;   EYE SURGERY     IR IMAGING GUIDED PORT INSERTION  05/15/2021    SOCIAL HISTORY: Social History   Socioeconomic History   Marital status: Married    Spouse name: Not on file   Number of children: 1   Years of education: Not on file   Highest education level: Not on file  Occupational History   Not on file  Tobacco Use   Smoking status: Every Day    Types: Cigarettes   Smokeless tobacco: Never  Vaping Use   Vaping Use: Never used  Substance and Sexual Activity   Alcohol use: Yes    Alcohol/week: 7.0 standard drinks    Types: 7 Glasses of wine per week  Comment: 3-4 glasses wine per night   Drug use: No   Sexual activity: Yes  Other Topics Concern   Not on file  Social History Narrative   Not on file   Social Determinants of Health   Financial Resource Strain: Not on file  Food Insecurity: Not on file  Transportation Needs: Not on file  Physical Activity: Not on file  Stress: Not on file  Social Connections: Not on file  Intimate Partner Violence: Not on file    FAMILY HISTORY: Family History  Problem Relation Age  of Onset   Hypertension Mother    Hypertension Father    Hypertension Sister     ALLERGIES:  is allergic to benzalkonium chloride.  MEDICATIONS:  Current Outpatient Medications  Medication Sig Dispense Refill   amLODipine (NORVASC) 10 MG tablet TAKE 1 TABLET(10 MG) BY MOUTH DAILY 90 tablet 0   cromolyn (NASALCROM) 5.2 MG/ACT nasal spray Place 1 spray into both nostrils 2 (two) times daily as needed for allergies.     ELIQUIS 5 MG TABS tablet TAKE 1 TABLET(5 MG) BY MOUTH TWICE DAILY 180 tablet 0   Gemcitabine HCl (GEMZAR IV) Inject 1 Bag into the vein once a week. Pt gets for 30 minutes once per week     lidocaine-prilocaine (EMLA) cream Apply 1 application topically as needed. 30 g 0   lisinopril-hydrochlorothiazide (ZESTORETIC) 10-12.5 MG tablet TAKE 1 TABLET BY MOUTH DAILY 90 tablet 0   Multiple Vitamins-Minerals (PRESERVISION AREDS 2 PO) Take 1 tablet by mouth 2 (two) times daily.      ondansetron (ZOFRAN) 8 MG tablet TAKE 1 TABLET(8 MG) BY MOUTH EVERY 8 HOURS AS NEEDED FOR NAUSEA OR VOMITING 30 tablet 0   PACLitaxel Protein-Bound Part (ABRAXANE IV) Inject 1 Bag into the vein once a week. Pt gets for 30 minutes once per week     prochlorperazine (COMPAZINE) 10 MG tablet TAKE 1 TABLET(10 MG) BY MOUTH EVERY 6 HOURS AS NEEDED FOR NAUSEA OR VOMITING 30 tablet 0   psyllium (METAMUCIL) 58.6 % packet Take 1 packet by mouth daily.     rosuvastatin (CRESTOR) 40 MG tablet Take 40 mg by mouth every evening.     tadalafil (CIALIS) 20 MG tablet TAKE 1 TABLET BY MOUTH EVERY DAY AS NEEDED (Patient taking differently: Take 10 mg by mouth daily as needed for erectile dysfunction.) 30 tablet 5   TRAVATAN Z 0.004 % SOLN ophthalmic solution Place 1 drop into the right eye at bedtime. [place]  12   triamcinolone (KENALOG) 0.1 % APPLY EXTERNALLY TO THE AFFECTED AREA TWICE DAILY (Patient taking differently: Apply 1 application topically daily as needed (psoriasis).) 45 g 5   No current facility-administered  medications for this visit.   Facility-Administered Medications Ordered in Other Visits  Medication Dose Route Frequency Provider Last Rate Last Admin   gemcitabine (GEMZAR) 2,432 mg in sodium chloride 0.9 % 250 mL chemo infusion  1,000 mg/m2 (Treatment Plan Recorded) Intravenous Once Orson Slick, MD 628 mL/hr at 06/25/21 1237 2,432 mg at 06/25/21 1237   heparin lock flush 100 unit/mL  500 Units Intracatheter Once PRN Orson Slick, MD       loratadine (CLARITIN) tablet 10 mg  10 mg Oral Daily Ledell Peoples IV, MD   10 mg at 06/25/21 1020   sodium chloride flush (NS) 0.9 % injection 10 mL  10 mL Intracatheter PRN Orson Slick, MD        REVIEW OF SYSTEMS:  Constitutional: ( - ) fevers, ( - )  chills , ( - ) night sweats Eyes: ( - ) blurriness of vision, ( - ) double vision, ( - ) watery eyes Ears, nose, mouth, throat, and face: ( - ) mucositis, ( - ) sore throat Respiratory: ( - ) cough, ( - ) dyspnea, ( - ) wheezes Cardiovascular: ( - ) palpitation, ( - ) chest discomfort, ( - ) lower extremity swelling Gastrointestinal:  ( - ) nausea, ( - ) heartburn, ( - ) change in bowel habits Skin: ( - ) abnormal skin rashes Lymphatics: ( - ) new lymphadenopathy, ( - ) easy bruising Neurological: ( - ) numbness, ( - ) tingling, ( - ) new weaknesses Behavioral/Psych: ( - ) mood change, ( - ) new changes  All other systems were reviewed with the patient and are negative.  PHYSICAL EXAMINATION: ECOG PERFORMANCE STATUS: 1 - Symptomatic but completely ambulatory  Vitals:   06/25/21 0927  BP: 120/72  Pulse: 77  Resp: 18  Temp: (!) 97.5 F (36.4 C)  SpO2: 99%     Filed Weights   06/25/21 0927  Weight: 237 lb 4.8 oz (107.6 kg)     GENERAL: Well-appearing elderly Caucasian male, alert, no distress and comfortable SKIN: skin color, texture, turgor are normal, no rashes or significant lesions EYES: conjunctiva are pink and non-injected, sclera clear LUNGS: clear to  auscultation and percussion with normal breathing effort HEART: regular rate & rhythm and no murmurs and no lower extremity edema Musculoskeletal: no cyanosis of digits and no clubbing  PSYCH: alert & oriented x 3, fluent speech NEURO: no focal motor/sensory deficits  LABORATORY DATA:  I have reviewed the data as listed CBC Latest Ref Rng & Units 06/25/2021 06/12/2021 06/04/2021  WBC 4.0 - 10.5 K/uL 5.2 3.6(L) 6.2  Hemoglobin 13.0 - 17.0 g/dL 8.8(L) 8.8(L) 10.1(L)  Hematocrit 39.0 - 52.0 % 26.7(L) 26.4(L) 30.3(L)  Platelets 150 - 400 K/uL 209 174 255    CMP Latest Ref Rng & Units 06/25/2021 06/12/2021 06/04/2021  Glucose 70 - 99 mg/dL 89 103(H) 99  BUN 8 - 23 mg/dL 18 29(H) 32(H)  Creatinine 0.61 - 1.24 mg/dL 1.45(H) 1.59(H) 1.61(H)  Sodium 135 - 145 mmol/L 140 141 140  Potassium 3.5 - 5.1 mmol/L 3.8 4.2 4.2  Chloride 98 - 111 mmol/L 108 109 105  CO2 22 - 32 mmol/L 21(L) 22 22  Calcium 8.9 - 10.3 mg/dL 8.6(L) 9.0 9.2  Total Protein 6.5 - 8.1 g/dL 5.9(L) 6.3(L) 6.8  Total Bilirubin 0.3 - 1.2 mg/dL 0.8 0.7 0.9  Alkaline Phos 38 - 126 U/L 197(H) 190(H) 190(H)  AST 15 - 41 U/L 34 127(H) 79(H)  ALT 0 - 44 U/L 51(H) 172(H) 108(H)    RADIOGRAPHIC STUDIES: I have personally reviewed the radiological images as listed and agreed with the findings in the report. No results found.  ASSESSMENT & PLAN Lee Kim 75 y.o. male with medical history significant for metastatic adenocarcinoma of the pancreas who presents for a follow up visit.  After review of the labs, review of the records, and discussion with the patient the patients findings are most consistent with metastatic adenocarcinoma the pancreas.  This is based on the pathological findings and the distribution of the lymph node involvement.  Tumor markers ordered on 05/07/2021 support the diagnosis of pancreatic primary.  Lung adenocarcinoma was on the differential but at this time without a clear lung primary I would favor metastatic  pancreatic  cancer.   At this time would recommend pursuing port placement with consideration of gemcitabine and Abraxane.  This regimen would consist of gemcitabine 1000 mg per metered squared IV on days 1, 8, 15 and albumin-bound paclitaxel 125 mg per metered squared on days 1, 8, 15 of a 28-day cycle.  This would be continued until intolerance or progression.  # Metastatic Adenocarcinoma of the Pancreas -- Findings at this time are most consistent with metastatic adenocarcinoma the pancreas.   -- Tumor markers collected on 05/07/2021 show an AFP of 2.7, CA 19-9 of 1870, and a CEA of 75.47 --Regimen of choice this time would be gemcitabine and Abraxane --We will order BRCA 1 and 2 mutational status to see if there are options for further therapy --Cycle 1 Day 1 of Gem/Abraxane on 05/21/2021 Plan:  --proceed with chemotherapy today --will transition to monotherapy gemcitabine alone if patient is unable to tolerate the doublet treatment --RTC in 1 week for Cycle 2 Day 8 and 2 weeks for clinic visit/continued treatment.   #Anemia, chemotherapy induced --Hgb 8.8, stable from prior --continue to monitor   #Supportive Care -- chemotherapy education complete -- port placed -- zofran 68m q8H PRN and compazine 1100mPO q6H for nausea -- EMLA cream for port -- no pain medication required at this time.   No orders of the defined types were placed in this encounter.   All questions were answered. The patient knows to call the clinic with any problems, questions or concerns.  A total of more than 30 minutes were spent on this encounter with face-to-face time and non-face-to-face time, including preparing to see the patient, ordering tests and/or medications, counseling the patient and coordination of care as outlined above.   JoLedell PeoplesMD Department of Hematology/Oncology [facility]: [phone]: [phone]:  [email or web address]  06/25/2021 12:52 PM

## 2021-06-25 NOTE — Patient Instructions (Signed)
[facility]  Discharge Instructions: Thank you for choosing [facility] to provide your oncology and hematology care.   If you have a lab appointment with the [facility], please go directly to the [facility] and check in at the registration area.   Wear comfortable clothing and clothing appropriate for easy access to any Portacath or PICC line.   We strive to give you quality time with your provider. You may need to reschedule your appointment if you arrive late (15 or more minutes).  Arriving late affects you and other patients whose appointments are after yours.  Also, if you miss three or more appointments without notifying the office, you may be dismissed from the clinic at the provider's discretion.      For prescription refill requests, have your pharmacy contact our office and allow 72 hours for refills to be completed.    Today you received the following chemotherapy and/or immunotherapy agents: Abraxane/Gemzar.      To help prevent nausea and vomiting after your treatment, we encourage you to take your nausea medication as directed.  BELOW ARE SYMPTOMS THAT SHOULD BE REPORTED IMMEDIATELY: *FEVER GREATER THAN 100.4 F (38 C) OR HIGHER *CHILLS OR SWEATING *NAUSEA AND VOMITING THAT IS NOT CONTROLLED WITH YOUR NAUSEA MEDICATION *UNUSUAL SHORTNESS OF BREATH *UNUSUAL BRUISING OR BLEEDING *URINARY PROBLEMS (pain or burning when urinating, or frequent urination) *BOWEL PROBLEMS (unusual diarrhea, constipation, pain near the anus) TENDERNESS IN MOUTH AND THROAT WITH OR WITHOUT PRESENCE OF ULCERS (sore throat, sores in mouth, or a toothache) UNUSUAL RASH, SWELLING OR PAIN  UNUSUAL VAGINAL DISCHARGE OR ITCHING   Items with * indicate a potential emergency and should be followed up as soon as possible or go to the Emergency Department if any problems should occur.  Please show the CHEMOTHERAPY ALERT CARD or IMMUNOTHERAPY ALERT CARD at  check-in to the Emergency Department and triage nurse.  Should you have questions after your visit or need to cancel or reschedule your appointment, please contact [facility]  Dept: [phone]  and follow the prompts.  Office hours are 8:00 a.m. to 4:30 p.m. Monday - Friday. Please note that voicemails left after 4:00 p.m. may not be returned until the following business day.  We are closed weekends and major holidays. You have access to a nurse at all times for urgent questions. Please call the main number to the clinic Dept: [phone] and follow the prompts.   For any non-urgent questions, you may also contact your provider using MyChart. We now offer e-Visits for anyone 68 and older to request care online for non-urgent symptoms. For details visit [email or web address].   Also download the MyChart app! Go to the app store, search "MyChart", open the app, select [facility], and log in with your MyChart username and password.  Due to Covid, a mask is required upon entering the hospital/clinic. If you do not have a mask, one will be given to you upon arrival. For doctor visits, patients may have 1 support person aged 5 or older with them. For treatment visits, patients cannot have anyone with them due to current Covid guidelines and our immunocompromised population.

## 2021-07-01 MED FILL — Dexamethasone Sodium Phosphate Inj 100 MG/10ML: INTRAMUSCULAR | Qty: 1 | Status: AC

## 2021-07-02 ENCOUNTER — Inpatient Hospital Stay: Payer: PPO

## 2021-07-02 ENCOUNTER — Other Ambulatory Visit: Payer: Self-pay

## 2021-07-02 ENCOUNTER — Other Ambulatory Visit: Payer: Self-pay | Admitting: Hematology and Oncology

## 2021-07-02 VITALS — BP 121/61 | HR 105 | Temp 97.7°F | Resp 18 | Wt 234.1 lb

## 2021-07-02 DIAGNOSIS — C772 Secondary and unspecified malignant neoplasm of intra-abdominal lymph nodes: Secondary | ICD-10-CM

## 2021-07-02 DIAGNOSIS — C259 Malignant neoplasm of pancreas, unspecified: Secondary | ICD-10-CM

## 2021-07-02 DIAGNOSIS — Z95828 Presence of other vascular implants and grafts: Secondary | ICD-10-CM

## 2021-07-02 DIAGNOSIS — Z5111 Encounter for antineoplastic chemotherapy: Secondary | ICD-10-CM | POA: Diagnosis not present

## 2021-07-02 LAB — CBC WITH DIFFERENTIAL (CANCER CENTER ONLY)
Abs Immature Granulocytes: 0.36 10*3/uL — ABNORMAL HIGH (ref 0.00–0.07)
Basophils Absolute: 0.1 10*3/uL (ref 0.0–0.1)
Basophils Relative: 5 %
Eosinophils Absolute: 0.1 10*3/uL (ref 0.0–0.5)
Eosinophils Relative: 3 %
HCT: 25.5 % — ABNORMAL LOW (ref 39.0–52.0)
Hemoglobin: 8.6 g/dL — ABNORMAL LOW (ref 13.0–17.0)
Immature Granulocytes: 15 %
Lymphocytes Relative: 36 %
Lymphs Abs: 0.8 10*3/uL (ref 0.7–4.0)
MCH: 34.4 pg — ABNORMAL HIGH (ref 26.0–34.0)
MCHC: 33.7 g/dL (ref 30.0–36.0)
MCV: 102 fL — ABNORMAL HIGH (ref 80.0–100.0)
Monocytes Absolute: 0.2 10*3/uL (ref 0.1–1.0)
Monocytes Relative: 9 %
Neutro Abs: 0.8 10*3/uL — ABNORMAL LOW (ref 1.7–7.7)
Neutrophils Relative %: 32 %
Platelet Count: 208 10*3/uL (ref 150–400)
RBC: 2.5 MIL/uL — ABNORMAL LOW (ref 4.22–5.81)
RDW: 20 % — ABNORMAL HIGH (ref 11.5–15.5)
WBC Count: 2.4 10*3/uL — ABNORMAL LOW (ref 4.0–10.5)
nRBC: 9.4 % — ABNORMAL HIGH (ref 0.0–0.2)

## 2021-07-02 LAB — MAGNESIUM: Magnesium: 1.7 mg/dL (ref 1.7–2.4)

## 2021-07-02 LAB — CMP (CANCER CENTER ONLY)
ALT: 76 U/L — ABNORMAL HIGH (ref 0–44)
AST: 65 U/L — ABNORMAL HIGH (ref 15–41)
Albumin: 2.7 g/dL — ABNORMAL LOW (ref 3.5–5.0)
Alkaline Phosphatase: 190 U/L — ABNORMAL HIGH (ref 38–126)
Anion gap: 10 (ref 5–15)
BUN: 15 mg/dL (ref 8–23)
CO2: 21 mmol/L — ABNORMAL LOW (ref 22–32)
Calcium: 8.9 mg/dL (ref 8.9–10.3)
Chloride: 111 mmol/L (ref 98–111)
Creatinine: 1.14 mg/dL (ref 0.61–1.24)
GFR, Estimated: >60 mL/min (ref >60)
Glucose, Bld: 98 mg/dL (ref 70–99)
Potassium: 3.7 mmol/L (ref 3.5–5.1)
Sodium: 142 mmol/L (ref 135–145)
Total Bilirubin: 0.8 mg/dL (ref 0.3–1.2)
Total Protein: 6 g/dL — ABNORMAL LOW (ref 6.5–8.1)

## 2021-07-02 MED ORDER — SODIUM CHLORIDE 0.9 % IV SOLN: Freq: Once | INTRAVENOUS | Status: AC

## 2021-07-02 MED ORDER — LORATADINE 10 MG PO TABS
10.0000 mg | ORAL_TABLET | Freq: Every day | ORAL | Status: DC
  Administered 2021-07-02: 10 mg via ORAL
  Filled 2021-07-02: qty 1

## 2021-07-02 MED ORDER — SODIUM CHLORIDE 0.9% FLUSH
10.0000 mL | Freq: Once | INTRAVENOUS | Status: AC
  Administered 2021-07-02: 10 mL

## 2021-07-02 MED ORDER — SODIUM CHLORIDE 0.9% FLUSH
10.0000 mL | INTRAVENOUS | Status: DC | PRN
  Administered 2021-07-02: 10 mL

## 2021-07-02 MED ORDER — FAMOTIDINE 20 MG IN NS 100 ML IVPB
20.0000 mg | Freq: Once | INTRAVENOUS | Status: AC
  Administered 2021-07-02: 20 mg via INTRAVENOUS
  Filled 2021-07-02: qty 100

## 2021-07-02 MED ORDER — SODIUM CHLORIDE 0.9 % IV SOLN
1000.0000 mg/m2 | Freq: Once | INTRAVENOUS | Status: AC
  Administered 2021-07-02: 2432 mg via INTRAVENOUS
  Filled 2021-07-02: qty 63.96

## 2021-07-02 MED ORDER — SODIUM CHLORIDE 0.9 % IV SOLN
10.0000 mg | Freq: Once | INTRAVENOUS | Status: AC
  Administered 2021-07-02: 10 mg via INTRAVENOUS
  Filled 2021-07-02: qty 10

## 2021-07-02 MED ORDER — PACLITAXEL PROTEIN-BOUND CHEMO INJECTION 100 MG
125.0000 mg/m2 | Freq: Once | INTRAVENOUS | Status: AC
  Administered 2021-07-02: 300 mg via INTRAVENOUS
  Filled 2021-07-02: qty 60

## 2021-07-02 MED ORDER — PEGFILGRASTIM-CBQV 6 MG/0.6ML ~~LOC~~ SOSY: 6.0000 mg | PREFILLED_SYRINGE | Freq: Once | SUBCUTANEOUS | Status: DC

## 2021-07-02 MED ORDER — ACETAMINOPHEN 500 MG PO TABS
1000.0000 mg | ORAL_TABLET | Freq: Once | ORAL | Status: AC
  Administered 2021-07-02: 1000 mg via ORAL
  Filled 2021-07-02: qty 2

## 2021-07-02 MED ORDER — ONDANSETRON HCL 4 MG/2ML IJ SOLN
8.0000 mg | Freq: Once | INTRAMUSCULAR | Status: AC
  Administered 2021-07-02: 8 mg via INTRAVENOUS
  Filled 2021-07-02: qty 4

## 2021-07-02 MED ORDER — HEPARIN SOD (PORK) LOCK FLUSH 100 UNIT/ML IV SOLN
500.0000 [IU] | Freq: Once | INTRAVENOUS | Status: AC | PRN
  Administered 2021-07-02: 500 [IU]

## 2021-07-02 NOTE — Progress Notes (Signed)
Per Dr. Lorenso Courier, ok to treat with elevated HR and low ANC

## 2021-07-02 NOTE — Patient Instructions (Signed)
[facility]COL[facility]  Discharge Instructions: Thank you for choosing [facility] to provide your oncology and hematology care.   If you have a lab appointment with the [facility], please go directly to the [facility] and check in at the registration area.   Wear comfortable clothing and clothing appropriate for easy access to any Portacath or PICC line.   We strive to give you quality time with your provider. You may need to reschedule your appointment if you arrive late (15 or more minutes).  Arriving late affects you and other patients whose appointments are after yours.  Also, if you miss three or more appointments without notifying the office, you may be dismissed from the clinic at the provider's discretion.      For prescription refill requests, have your pharmacy contact our office and allow 72 hours for refills to be completed.    Today you received the following chemotherapy and/or immunotherapy agents gemzar, abraxane   To help prevent nausea and vomiting after your treatment, we encourage you to take your nausea medication as directed.  BELOW ARE SYMPTOMS THAT SHOULD BE REPORTED IMMEDIATELY: *FEVER GREATER THAN 100.4 F (38 C) OR HIGHER *CHILLS OR SWEATING *NAUSEA AND VOMITING THAT IS NOT CONTROLLED WITH YOUR NAUSEA MEDICATION *UNUSUAL SHORTNESS OF BREATH *UNUSUAL BRUISING OR BLEEDING *URINARY PROBLEMS (pain or burning when urinating, or frequent urination) *BOWEL PROBLEMS (unusual diarrhea, constipation, pain near the anus) TENDERNESS IN MOUTH AND THROAT WITH OR WITHOUT PRESENCE OF ULCERS (sore throat, sores in mouth, or a toothache) UNUSUAL RASH, SWELLING OR PAIN  UNUSUAL VAGINAL DISCHARGE OR ITCHING   Items with * indicate a potential emergency and should be followed up as soon as possible or go to the Emergency Department if any problems should occur.  Please show the CHEMOTHERAPY ALERT CARD or IMMUNOTHERAPY ALERT CARD at check-in  to the Emergency Department and triage nurse.  Should you have questions after your visit or need to cancel or reschedule your appointment, please contact [facility]  Dept: [phone]  and follow the prompts.  Office hours are 8:00 a.m. to 4:30 p.m. Monday - Friday. Please note that voicemails left after 4:00 p.m. may not be returned until the following business day.  We are closed weekends and major holidays. You have access to a nurse at all times for urgent questions. Please call the main number to the clinic Dept: [phone] and follow the prompts.   For any non-urgent questions, you may also contact your provider using MyChart. We now offer e-Visits for anyone 73 and older to request care online for non-urgent symptoms. For details visit [email or web address].   Also download the MyChart app! Go to the app store, search "MyChart", open the app, select [facility], and log in with your MyChart username and password.  Due to Covid, a mask is required upon entering the hospital/clinic. If you do not have a mask, one will be given to you upon arrival. For doctor visits, patients may have 1 support person aged 73 or older with them. For treatment visits, patients cannot have anyone with them due to current Covid guidelines and our immunocompromised population.

## 2021-07-03 ENCOUNTER — Ambulatory Visit: Payer: PPO

## 2021-07-03 DIAGNOSIS — G4733 Obstructive sleep apnea (adult) (pediatric): Secondary | ICD-10-CM | POA: Diagnosis not present

## 2021-07-07 ENCOUNTER — Telehealth: Payer: Self-pay | Admitting: Hematology and Oncology

## 2021-07-07 NOTE — Telephone Encounter (Signed)
Sch per 9/29 los, unable to speak with pt due to busy line

## 2021-07-08 ENCOUNTER — Ambulatory Visit: Payer: PPO | Admitting: Hematology and Oncology

## 2021-07-08 ENCOUNTER — Inpatient Hospital Stay: Payer: PPO

## 2021-07-08 ENCOUNTER — Inpatient Hospital Stay: Payer: PPO | Admitting: Hematology and Oncology

## 2021-07-08 ENCOUNTER — Telehealth: Payer: Self-pay | Admitting: Hematology and Oncology

## 2021-07-08 ENCOUNTER — Other Ambulatory Visit: Payer: PPO

## 2021-07-08 NOTE — Telephone Encounter (Signed)
Sch per 9/30 inbasket, had not been able to get in contact, spoke with pt wife 10/5 , noted they are not coming due to stomach concerns, desk nurse notified

## 2021-07-09 ENCOUNTER — Ambulatory Visit: Payer: PPO | Admitting: Hematology and Oncology

## 2021-07-09 ENCOUNTER — Other Ambulatory Visit: Payer: PPO

## 2021-07-11 ENCOUNTER — Inpatient Hospital Stay: Payer: PPO

## 2021-07-12 ENCOUNTER — Other Ambulatory Visit: Payer: Self-pay

## 2021-07-12 ENCOUNTER — Emergency Department (HOSPITAL_COMMUNITY): Payer: PPO

## 2021-07-12 ENCOUNTER — Inpatient Hospital Stay (HOSPITAL_COMMUNITY): Payer: PPO

## 2021-07-12 ENCOUNTER — Inpatient Hospital Stay (HOSPITAL_COMMUNITY)
Admission: EM | Admit: 2021-07-12 | Discharge: 2021-07-17 | DRG: 871 | Disposition: A | Discharge status: Home Health | Payer: PPO | Attending: Student | Admitting: Student

## 2021-07-12 DIAGNOSIS — Z20822 Contact with and (suspected) exposure to covid-19: Secondary | ICD-10-CM | POA: Diagnosis not present

## 2021-07-12 DIAGNOSIS — Z888 Allergy status to other drugs, medicaments and biological substances status: Secondary | ICD-10-CM

## 2021-07-12 DIAGNOSIS — Z7401 Bed confinement status: Secondary | ICD-10-CM | POA: Diagnosis not present

## 2021-07-12 DIAGNOSIS — R410 Disorientation, unspecified: Secondary | ICD-10-CM | POA: Diagnosis not present

## 2021-07-12 DIAGNOSIS — N390 Urinary tract infection, site not specified: Secondary | ICD-10-CM | POA: Diagnosis not present

## 2021-07-12 DIAGNOSIS — I4821 Permanent atrial fibrillation: Secondary | ICD-10-CM | POA: Diagnosis not present

## 2021-07-12 DIAGNOSIS — E872 Acidosis, unspecified: Secondary | ICD-10-CM | POA: Diagnosis not present

## 2021-07-12 DIAGNOSIS — F17210 Nicotine dependence, cigarettes, uncomplicated: Secondary | ICD-10-CM | POA: Diagnosis present

## 2021-07-12 DIAGNOSIS — N179 Acute kidney failure, unspecified: Secondary | ICD-10-CM | POA: Diagnosis not present

## 2021-07-12 DIAGNOSIS — I517 Cardiomegaly: Secondary | ICD-10-CM | POA: Diagnosis not present

## 2021-07-12 DIAGNOSIS — Z8249 Family history of ischemic heart disease and other diseases of the circulatory system: Secondary | ICD-10-CM

## 2021-07-12 DIAGNOSIS — Z6822 Body mass index (BMI) 22.0-22.9, adult: Secondary | ICD-10-CM

## 2021-07-12 DIAGNOSIS — N401 Enlarged prostate with lower urinary tract symptoms: Secondary | ICD-10-CM | POA: Diagnosis not present

## 2021-07-12 DIAGNOSIS — A411 Sepsis due to other specified staphylococcus: Secondary | ICD-10-CM | POA: Diagnosis not present

## 2021-07-12 DIAGNOSIS — A419 Sepsis, unspecified organism: Secondary | ICD-10-CM | POA: Diagnosis not present

## 2021-07-12 DIAGNOSIS — M6282 Rhabdomyolysis: Secondary | ICD-10-CM | POA: Diagnosis present

## 2021-07-12 DIAGNOSIS — D539 Nutritional anemia, unspecified: Secondary | ICD-10-CM | POA: Diagnosis not present

## 2021-07-12 DIAGNOSIS — N170 Acute kidney failure with tubular necrosis: Secondary | ICD-10-CM | POA: Diagnosis present

## 2021-07-12 DIAGNOSIS — D6481 Anemia due to antineoplastic chemotherapy: Secondary | ICD-10-CM | POA: Diagnosis not present

## 2021-07-12 DIAGNOSIS — Z79899 Other long term (current) drug therapy: Secondary | ICD-10-CM

## 2021-07-12 DIAGNOSIS — N281 Cyst of kidney, acquired: Secondary | ICD-10-CM | POA: Diagnosis not present

## 2021-07-12 DIAGNOSIS — R5381 Other malaise: Secondary | ICD-10-CM | POA: Diagnosis not present

## 2021-07-12 DIAGNOSIS — I4819 Other persistent atrial fibrillation: Secondary | ICD-10-CM | POA: Diagnosis not present

## 2021-07-12 DIAGNOSIS — I5022 Chronic systolic (congestive) heart failure: Secondary | ICD-10-CM | POA: Diagnosis not present

## 2021-07-12 DIAGNOSIS — Z66 Do not resuscitate: Secondary | ICD-10-CM | POA: Diagnosis present

## 2021-07-12 DIAGNOSIS — R748 Abnormal levels of other serum enzymes: Secondary | ICD-10-CM | POA: Diagnosis not present

## 2021-07-12 DIAGNOSIS — E440 Moderate protein-calorie malnutrition: Secondary | ICD-10-CM | POA: Diagnosis not present

## 2021-07-12 DIAGNOSIS — R0602 Shortness of breath: Secondary | ICD-10-CM | POA: Diagnosis not present

## 2021-07-12 DIAGNOSIS — I959 Hypotension, unspecified: Secondary | ICD-10-CM | POA: Diagnosis not present

## 2021-07-12 DIAGNOSIS — G4733 Obstructive sleep apnea (adult) (pediatric): Secondary | ICD-10-CM | POA: Diagnosis present

## 2021-07-12 DIAGNOSIS — Z7901 Long term (current) use of anticoagulants: Secondary | ICD-10-CM

## 2021-07-12 DIAGNOSIS — R140 Abdominal distension (gaseous): Secondary | ICD-10-CM

## 2021-07-12 DIAGNOSIS — T451X5A Adverse effect of antineoplastic and immunosuppressive drugs, initial encounter: Secondary | ICD-10-CM | POA: Diagnosis present

## 2021-07-12 DIAGNOSIS — I10 Essential (primary) hypertension: Secondary | ICD-10-CM | POA: Diagnosis present

## 2021-07-12 DIAGNOSIS — I2510 Atherosclerotic heart disease of native coronary artery without angina pectoris: Secondary | ICD-10-CM | POA: Diagnosis present

## 2021-07-12 DIAGNOSIS — R000 Tachycardia, unspecified: Secondary | ICD-10-CM | POA: Diagnosis not present

## 2021-07-12 DIAGNOSIS — Z85048 Personal history of other malignant neoplasm of rectum, rectosigmoid junction, and anus: Secondary | ICD-10-CM

## 2021-07-12 DIAGNOSIS — C772 Secondary and unspecified malignant neoplasm of intra-abdominal lymph nodes: Secondary | ICD-10-CM | POA: Diagnosis not present

## 2021-07-12 DIAGNOSIS — C259 Malignant neoplasm of pancreas, unspecified: Secondary | ICD-10-CM | POA: Diagnosis not present

## 2021-07-12 DIAGNOSIS — R197 Diarrhea, unspecified: Secondary | ICD-10-CM | POA: Diagnosis not present

## 2021-07-12 DIAGNOSIS — R6520 Severe sepsis without septic shock: Secondary | ICD-10-CM | POA: Diagnosis not present

## 2021-07-12 DIAGNOSIS — T796XXA Traumatic ischemia of muscle, initial encounter: Secondary | ICD-10-CM | POA: Diagnosis not present

## 2021-07-12 DIAGNOSIS — I672 Cerebral atherosclerosis: Secondary | ICD-10-CM | POA: Diagnosis not present

## 2021-07-12 DIAGNOSIS — R338 Other retention of urine: Secondary | ICD-10-CM | POA: Diagnosis not present

## 2021-07-12 DIAGNOSIS — R109 Unspecified abdominal pain: Secondary | ICD-10-CM | POA: Diagnosis not present

## 2021-07-12 LAB — COMPREHENSIVE METABOLIC PANEL
ALT: 73 U/L — ABNORMAL HIGH (ref 0–44)
AST: 86 U/L — ABNORMAL HIGH (ref 15–41)
Albumin: 2.4 g/dL — ABNORMAL LOW (ref 3.5–5.0)
Alkaline Phosphatase: 213 U/L — ABNORMAL HIGH (ref 38–126)
Anion gap: 14 (ref 5–15)
BUN: 19 mg/dL (ref 8–23)
CO2: 16 mmol/L — ABNORMAL LOW (ref 22–32)
Calcium: 8.1 mg/dL — ABNORMAL LOW (ref 8.9–10.3)
Chloride: 111 mmol/L (ref 98–111)
Creatinine, Ser: 2.27 mg/dL — ABNORMAL HIGH (ref 0.61–1.24)
GFR, Estimated: 30 mL/min — ABNORMAL LOW (ref >60)
Glucose, Bld: 138 mg/dL — ABNORMAL HIGH (ref 70–99)
Potassium: 3.4 mmol/L — ABNORMAL LOW (ref 3.5–5.1)
Sodium: 141 mmol/L (ref 135–145)
Total Bilirubin: 1 mg/dL (ref 0.3–1.2)
Total Protein: 5.5 g/dL — ABNORMAL LOW (ref 6.5–8.1)

## 2021-07-12 LAB — CBC WITH DIFFERENTIAL/PLATELET
Abs Immature Granulocytes: 0.27 10*3/uL — ABNORMAL HIGH (ref 0.00–0.07)
Basophils Absolute: 0 10*3/uL (ref 0.0–0.1)
Basophils Relative: 0 %
Eosinophils Absolute: 0 10*3/uL (ref 0.0–0.5)
Eosinophils Relative: 0 %
HCT: 26.4 % — ABNORMAL LOW (ref 39.0–52.0)
Hemoglobin: 8.4 g/dL — ABNORMAL LOW (ref 13.0–17.0)
Immature Granulocytes: 6 %
Lymphocytes Relative: 10 %
Lymphs Abs: 0.5 10*3/uL — ABNORMAL LOW (ref 0.7–4.0)
MCH: 35.3 pg — ABNORMAL HIGH (ref 26.0–34.0)
MCHC: 31.8 g/dL (ref 30.0–36.0)
MCV: 110.9 fL — ABNORMAL HIGH (ref 80.0–100.0)
Monocytes Absolute: 1.1 10*3/uL — ABNORMAL HIGH (ref 0.1–1.0)
Monocytes Relative: 22 %
Neutro Abs: 3 10*3/uL (ref 1.7–7.7)
Neutrophils Relative %: 62 %
Platelets: 137 10*3/uL — ABNORMAL LOW (ref 150–400)
RBC: 2.38 MIL/uL — ABNORMAL LOW (ref 4.22–5.81)
RDW: 23.1 % — ABNORMAL HIGH (ref 11.5–15.5)
WBC: 4.9 10*3/uL (ref 4.0–10.5)
nRBC: 13.3 % — ABNORMAL HIGH (ref 0.0–0.2)

## 2021-07-12 LAB — APTT: aPTT: 38 seconds — ABNORMAL HIGH (ref 24–36)

## 2021-07-12 LAB — URINALYSIS, ROUTINE W REFLEX MICROSCOPIC
Bacteria, UA: NONE SEEN
Bilirubin Urine: NEGATIVE
Glucose, UA: NEGATIVE mg/dL
Ketones, ur: NEGATIVE mg/dL
Leukocytes,Ua: NEGATIVE
Nitrite: NEGATIVE
Protein, ur: 100 mg/dL — AB
Specific Gravity, Urine: 1.013 (ref 1.005–1.030)
pH: 5 (ref 5.0–8.0)

## 2021-07-12 LAB — GLUCOSE, CAPILLARY: Glucose-Capillary: 124 mg/dL — ABNORMAL HIGH (ref 70–99)

## 2021-07-12 LAB — RETICULOCYTES
Immature Retic Fract: 44.6 % — ABNORMAL HIGH (ref 2.3–15.9)
RBC.: 2.09 MIL/uL — ABNORMAL LOW (ref 4.22–5.81)
Retic Count, Absolute: 143.4 10*3/uL (ref 19.0–186.0)
Retic Ct Pct: 6.9 % — ABNORMAL HIGH (ref 0.4–3.1)

## 2021-07-12 LAB — PROTIME-INR
INR: 1.8 — ABNORMAL HIGH (ref 0.8–1.2)
Prothrombin Time: 20.6 seconds — ABNORMAL HIGH (ref 11.4–15.2)

## 2021-07-12 LAB — CK: Total CK: 2582 U/L — ABNORMAL HIGH (ref 49–397)

## 2021-07-12 LAB — RESP PANEL BY RT-PCR (FLU A&B, COVID) ARPGX2
Influenza A by PCR: NEGATIVE
Influenza B by PCR: NEGATIVE
SARS Coronavirus 2 by RT PCR: NEGATIVE

## 2021-07-12 LAB — LACTIC ACID, PLASMA
Lactic Acid, Venous: 6.4 mmol/L (ref 0.5–1.9)
Lactic Acid, Venous: 1.8 mmol/L (ref 0.5–1.9)

## 2021-07-12 LAB — PHOSPHORUS: Phosphorus: 3.9 mg/dL (ref 2.5–4.6)

## 2021-07-12 LAB — IRON AND TIBC
Iron: 22 ug/dL — ABNORMAL LOW (ref 45–182)
Saturation Ratios: 12 % — ABNORMAL LOW (ref 17.9–39.5)
TIBC: 181 ug/dL — ABNORMAL LOW (ref 250–450)
UIBC: 159 ug/dL

## 2021-07-12 LAB — FERRITIN: Ferritin: 1843 ng/mL — ABNORMAL HIGH (ref 24–336)

## 2021-07-12 LAB — VITAMIN B12: Vitamin B-12: 1376 pg/mL — ABNORMAL HIGH (ref 180–914)

## 2021-07-12 LAB — BRAIN NATRIURETIC PEPTIDE: B Natriuretic Peptide: 249.5 pg/mL — ABNORMAL HIGH (ref 0.0–100.0)

## 2021-07-12 LAB — FOLATE: Folate: 14.2 ng/mL (ref >5.9)

## 2021-07-12 LAB — MAGNESIUM: Magnesium: 1.8 mg/dL (ref 1.7–2.4)

## 2021-07-12 MED ORDER — SODIUM CHLORIDE 0.45 % IV SOLN
INTRAVENOUS | Status: DC
  Filled 2021-07-12 (×2): qty 75

## 2021-07-12 MED ORDER — APIXABAN 2.5 MG PO TABS
2.5000 mg | ORAL_TABLET | Freq: Two times a day (BID) | ORAL | Status: DC
  Administered 2021-07-13 – 2021-07-15 (×5): 2.5 mg via ORAL
  Filled 2021-07-12 (×6): qty 1

## 2021-07-12 MED ORDER — ACETAMINOPHEN 325 MG PO TABS
650.0000 mg | ORAL_TABLET | Freq: Four times a day (QID) | ORAL | Status: DC | PRN
  Administered 2021-07-15: 650 mg via ORAL
  Filled 2021-07-12: qty 2

## 2021-07-12 MED ORDER — CROMOLYN SODIUM 5.2 MG/ACT NA AERS: 1.0000 | INHALATION_SPRAY | Freq: Two times a day (BID) | NASAL | Status: DC | PRN

## 2021-07-12 MED ORDER — ACETAMINOPHEN 650 MG RE SUPP: 650.0000 mg | Freq: Four times a day (QID) | RECTAL | Status: DC | PRN

## 2021-07-12 MED ORDER — ROSUVASTATIN CALCIUM 20 MG PO TABS
40.0000 mg | ORAL_TABLET | Freq: Every evening | ORAL | Status: DC
  Filled 2021-07-12: qty 2

## 2021-07-12 MED ORDER — ONDANSETRON HCL 4 MG/2ML IJ SOLN: 4.0000 mg | Freq: Four times a day (QID) | INTRAMUSCULAR | Status: DC | PRN

## 2021-07-12 MED ORDER — LACTATED RINGERS IV BOLUS (SEPSIS)
1500.0000 mL | Freq: Once | INTRAVENOUS | Status: AC
  Administered 2021-07-12: 1500 mL via INTRAVENOUS

## 2021-07-12 MED ORDER — HYDRALAZINE HCL 25 MG PO TABS: 25.0000 mg | ORAL_TABLET | Freq: Four times a day (QID) | ORAL | Status: DC | PRN

## 2021-07-12 MED ORDER — LOPERAMIDE HCL 2 MG PO CAPS
2.0000 mg | ORAL_CAPSULE | ORAL | Status: DC | PRN
  Administered 2021-07-12: 2 mg via ORAL
  Filled 2021-07-12: qty 1

## 2021-07-12 MED ORDER — RISAQUAD PO CAPS
2.0000 | ORAL_CAPSULE | Freq: Three times a day (TID) | ORAL | Status: DC
  Administered 2021-07-12 – 2021-07-16 (×14): 2 via ORAL
  Filled 2021-07-12 (×14): qty 2

## 2021-07-12 MED ORDER — LACTATED RINGERS IV BOLUS (SEPSIS): 1000.0000 mL | Freq: Once | INTRAVENOUS | Status: DC

## 2021-07-12 MED ORDER — METRONIDAZOLE 500 MG/100ML IV SOLN
500.0000 mg | Freq: Once | INTRAVENOUS | Status: AC
Start: 2021-07-12 — End: 2021-07-12
  Administered 2021-07-12: 500 mg via INTRAVENOUS
  Filled 2021-07-12: qty 100

## 2021-07-12 MED ORDER — ONDANSETRON HCL 4 MG PO TABS: 4.0000 mg | ORAL_TABLET | Freq: Four times a day (QID) | ORAL | Status: DC | PRN

## 2021-07-12 MED ORDER — VANCOMYCIN VARIABLE DOSE PER UNSTABLE RENAL FUNCTION (PHARMACIST DOSING): Status: DC

## 2021-07-12 MED ORDER — METOPROLOL TARTRATE 5 MG/5ML IV SOLN: 2.5000 mg | Freq: Four times a day (QID) | INTRAVENOUS | Status: DC | PRN

## 2021-07-12 MED ORDER — VANCOMYCIN HCL 1500 MG/300ML IV SOLN
1500.0000 mg | Freq: Once | INTRAVENOUS | Status: AC
  Administered 2021-07-12: 1500 mg via INTRAVENOUS
  Filled 2021-07-12: qty 300

## 2021-07-12 MED ORDER — SODIUM CHLORIDE 0.9 % IV SOLN
2.0000 g | Freq: Once | INTRAVENOUS | Status: AC
  Administered 2021-07-12: 2 g via INTRAVENOUS
  Filled 2021-07-12: qty 2

## 2021-07-12 MED ORDER — POTASSIUM CHLORIDE CRYS ER 20 MEQ PO TBCR
40.0000 meq | EXTENDED_RELEASE_TABLET | Freq: Once | ORAL | Status: AC
  Administered 2021-07-12: 40 meq via ORAL
  Filled 2021-07-12: qty 2

## 2021-07-12 MED ORDER — SODIUM CHLORIDE 0.9 % IV SOLN
2.0000 g | Freq: Two times a day (BID) | INTRAVENOUS | Status: DC
  Administered 2021-07-13 – 2021-07-14 (×4): 2 g via INTRAVENOUS
  Filled 2021-07-12 (×4): qty 2

## 2021-07-12 NOTE — ED Notes (Signed)
Patient transported to CT 

## 2021-07-12 NOTE — ED Triage Notes (Signed)
Patient arrived via EMS; reported patient was in the bathroom and slid self down. Denies head injury; - LOC; known hx of AFIB and is on eliquis.. EMS endorsed upon arrival on scene  patient was diaphoretic and weak. Initial HR was 180s afib RVR along w/ low BP; 1 L NS given PTA; and HR decreased and BP improved. Reported significant hx of pancreatic ca, on chemo. C/O NVD x 3 days.

## 2021-07-12 NOTE — ED Notes (Signed)
To Ultrasound

## 2021-07-12 NOTE — ED Notes (Signed)
Called [phone]; per nurse Shirlean Mylar, she is unable to review the SBAR at this time,

## 2021-07-12 NOTE — Progress Notes (Signed)
Pharmacy Antibiotic Note  Lee Kim is a 75 y.o. male admitted on 07/12/2021 with sepsis.  Pharmacy has been consulted for cefepime and vancomycin dosing.  Patient with a history of pancreatic adenocarcinoma with metastasis currently on chemotherapy (last received chemotherapy 3 days prior, Gem/Abraxane), permanent atrial fibrillation (on Eliquis), hypertension, thoracic aortic aneurysm without rupture. Patient presenting with generalized weakness, tachycardia, and shortness of breath.  SCr 2.27 - AKI WBC ip; LA 1.3; T 100.3 F  Plan: Metronidazole per MD Cefepime 2g q12h Vancomycin 1500 mg once, subsequent dosing as indicated per random vancomycin level until renal function stable and/or improved, at which time scheduled dosing can be considered Follow-up cultures and de-escalate antibiotics as appropriate.  Height: 6\' 1"  (185.4 cm) Weight: 77.1 kg (170 lb) IBW/kg (Calculated) : 79.9  Temp (24hrs), Avg:99.1 F (37.3 C), Min:97.8 F (36.6 C), Max:100.3 F (37.9 C)  Recent Labs  Lab 07/12/21 1139  WBC 4.9  CREATININE 2.27*    Estimated Creatinine Clearance: 31.6 mL/min (A) (by C-G formula based on SCr of 2.27 mg/dL (H)).    Allergies  Allergen Reactions   Benzalkonium Chloride     Causes Eye issues Type of preservative    Antimicrobials this admission: metronidazole 10/9 >>  cefepime 10/9 >>  vancomycin 10/9 >>   Microbiology results: Pending  Thank you for allowing pharmacy to be a part of this patient's care.  Lorelei Pont, PharmD, BCPS 07/12/2021 1:37 PM ED Clinical Pharmacist -  [phone]

## 2021-07-12 NOTE — H&P (Signed)
History and Physical    BERNARD SLAYDEN EGB:[id number] DOB: 12/06/1945 DOA: 07/12/2021  PCP: [name], MD (Confirm with patient/family/NH records and if not entered, this has to be entered at [facility] point of entry) Patient coming from: Home  I have personally briefly reviewed patient's old medical records in [place]  Chief Complaint: Feeling weak, N/V and diarrhea  HPI: MARGARET STAGGS is a 75 y.o. male with medical history significant of metastatic pancreatic cancer on chemotherapy, HTN, PAF on Eliquis, chronic macrocytic anemia, came with worsening of generalized weakness, diarrhea and fall.  Patient underwent fifth cycles of chemotherapy infusion including Paxil and gemcitabine on Wednesday.  Thursday, he started to have strong feeling nausea vomiting stomach content nonbloody normal bowel, and watery diarrhea.  Had 4 episodes of watery diarrhea on Thursday and 2 more yesterday.  He contacted oncology who recommended him taking as needed Imodium, he did just that but did not feel significant improvement.  He said he had similar episode of diarrhea after each cycle of chemotherapy but this time was far more severe.  He had no fever, no abdominal pain.  Patient also complains about feeling " cannot get legs straight" since Thursday, feeling weak, unable to stand on his feet yesterday.  When he tried to stand up became wobbly and slide down chair this morning.  No loss of consciousness, no head injury.  No cough, no urinary problems.  ED Course: Low fever of 100.3 F, Blood pressure borderline low, but responded to 2.5 L of IV bolus.  Blood work showed lactic acidosis 6.0, AKI creatinine 2.2 compared to baseline 1.5-1.9, bicarb 16, WBC 4.9, hemoglobin 8.4, AST 86, ALT 73.  CT head reassuring.  Review of Systems: As per HPI otherwise 14 point review of systems negative.    Past Medical History:  Diagnosis Date   Colonic polyp    Diverticulitis    Erythrocytosis     Hypertension    Obesity    OSA (obstructive sleep apnea)     Past Surgical History:  Procedure Laterality Date   COLONOSCOPY  03/2003   Dr. Wynetta Emery   COLONOSCOPY WITH PROPOFOL N/A 11/23/2016   Procedure: COLONOSCOPY WITH PROPOFOL;  Surgeon: Garlan Fair, MD;  Location: [facility];  Service: Endoscopy;  Laterality: N/A;   EYE SURGERY     IR IMAGING GUIDED PORT INSERTION  05/15/2021     reports that he has been smoking cigarettes. He has never used smokeless tobacco. He reports current alcohol use of about 7.0 standard drinks per week. He reports that he does not use drugs.  Allergies  Allergen Reactions   Benzalkonium Chloride     Causes Eye issues Type of preservative     Family History  Problem Relation Age of Onset   Hypertension Mother    Hypertension Father    Hypertension Sister      Prior to Admission medications   Medication Sig Start Date End Date Taking? Authorizing Provider  amLODipine (NORVASC) 10 MG tablet TAKE 1 TABLET(10 MG) BY MOUTH DAILY 06/18/21   [name], MD  cromolyn (NASALCROM) 5.2 MG/ACT nasal spray Place 1 spray into both nostrils 2 (two) times daily as needed for allergies.    [provider]  ELIQUIS 5 MG TABS tablet TAKE 1 TABLET(5 MG) BY MOUTH TWICE DAILY 06/18/21   [name], MD  Gemcitabine HCl (GEMZAR IV) Inject 1 Bag into the vein once a week. Pt gets for 30 minutes once per  week    [provider]  lidocaine-prilocaine (EMLA) cream Apply 1 application topically as needed. 05/07/21   Orson Slick, MD  lisinopril-hydrochlorothiazide (ZESTORETIC) 10-12.5 MG tablet TAKE 1 TABLET BY MOUTH DAILY 06/11/21   [name], MD  Multiple Vitamins-Minerals (PRESERVISION AREDS 2 PO) Take 1 tablet by mouth 2 (two) times daily.     [provider]  ondansetron (ZOFRAN) 8 MG tablet TAKE 1 TABLET(8 MG) BY MOUTH EVERY 8 HOURS AS NEEDED FOR NAUSEA OR VOMITING 06/19/21   Orson Slick, MD  PACLitaxel Protein-Bound  Part (ABRAXANE IV) Inject 1 Bag into the vein once a week. Pt gets for 30 minutes once per week    [provider]  prochlorperazine (COMPAZINE) 10 MG tablet TAKE 1 TABLET(10 MG) BY MOUTH EVERY 6 HOURS AS NEEDED FOR NAUSEA OR VOMITING 06/19/21   Orson Slick, MD  psyllium (METAMUCIL) 58.6 % packet Take 1 packet by mouth daily.    [provider]  rosuvastatin (CRESTOR) 40 MG tablet Take 40 mg by mouth every evening.    [provider]  tadalafil (CIALIS) 20 MG tablet TAKE 1 TABLET BY MOUTH EVERY DAY AS NEEDED Patient taking differently: Take 10 mg by mouth daily as needed for erectile dysfunction. 06/11/20   [name], MD  TRAVATAN Z 0.004 % SOLN ophthalmic solution Place 1 drop into the right eye at bedtime. Travapost 07/08/16   [provider]  triamcinolone (KENALOG) 0.1 % APPLY EXTERNALLY TO THE AFFECTED AREA TWICE DAILY Patient taking differently: Apply 1 application topically daily as needed (psoriasis). 10/14/20   [name], MD    Physical Exam: Vitals:   07/12/21 1310 07/12/21 1330 07/12/21 1400 07/12/21 1430  BP: 100/66 122/68 129/72 130/75  Pulse: 97 86 83 84  Resp: (!) 22 (!) '24 15 16  ' Temp:      TempSrc:      SpO2: 96% 99% 100% 99%  Weight:      Height:        Constitutional: NAD, calm, comfortable Vitals:   07/12/21 1310 07/12/21 1330 07/12/21 1400 07/12/21 1430  BP: 100/66 122/68 129/72 130/75  Pulse: 97 86 83 84  Resp: (!) 22 (!) '24 15 16  ' Temp:      TempSrc:      SpO2: 96% 99% 100% 99%  Weight:      Height:       Eyes: PERRL, lids and conjunctivae normal ENMT: Mucous membranes are dry. Posterior pharynx clear of any exudate or lesions.Normal dentition.  Neck: normal, supple, no masses, no thyromegaly Respiratory: clear to auscultation bilaterally, no wheezing, no crackles. Normal respiratory effort. No accessory muscle use.  Cardiovascular: Regular rate and rhythm, no murmurs / rubs / gallops. No extremity edema.  2+ pedal pulses. No carotid bruits.  Abdomen: no tenderness, no masses palpated. No hepatosplenomegaly. Bowel sounds positive.  Musculoskeletal: no clubbing / cyanosis. No joint deformity upper and lower extremities. Good ROM, no contractures. Normal muscle tone.  Skin: no rashes, lesions, ulcers. No induration Neurologic: CN 2-12 grossly intact. Sensation intact, DTR normal. Strength 5/5 in all 4.  Psychiatric: Normal judgment and insight. Alert and oriented x 3. Normal mood.     Labs on Admission: I have personally reviewed following labs and imaging studies  CBC: Recent Labs  Lab 07/12/21 1139  WBC 4.9  NEUTROABS 3.0  HGB 8.4*  HCT 26.4*  MCV 110.9*  PLT 440*   Basic Metabolic Panel: Recent  Labs  Lab 07/12/21 1139  NA 141  K 3.4*  CL 111  CO2 16*  GLUCOSE 138*  BUN 19  CREATININE 2.27*  CALCIUM 8.1*   GFR: Estimated Creatinine Clearance: 31.6 mL/min (A) (by C-G formula based on SCr of 2.27 mg/dL (H)). Liver Function Tests: Recent Labs  Lab 07/12/21 1139  AST 86*  ALT 73*  ALKPHOS 213*  BILITOT 1.0  PROT 5.5*  ALBUMIN 2.4*   No results for input(s): LIPASE, AMYLASE in the last 168 hours. No results for input(s): AMMONIA in the last 168 hours. Coagulation Profile: Recent Labs  Lab 07/12/21 1139  INR 1.8*   Cardiac Enzymes: No results for input(s): CKTOTAL, CKMB, CKMBINDEX, TROPONINI in the last 168 hours. BNP (last 3 results) No results for input(s): PROBNP in the last 8760 hours. HbA1C: No results for input(s): HGBA1C in the last 72 hours. CBG: No results for input(s): GLUCAP in the last 168 hours. Lipid Profile: No results for input(s): CHOL, HDL, LDLCALC, TRIG, CHOLHDL, LDLDIRECT in the last 72 hours. Thyroid Function Tests: No results for input(s): TSH, T4TOTAL, FREET4, T3FREE, THYROIDAB in the last 72 hours. Anemia Panel: No results for input(s): VITAMINB12, FOLATE, FERRITIN, TIBC, IRON, RETICCTPCT in the last 72 hours. Urine analysis:     Component Value Date/Time   COLORURINE AMBER (A) 05/26/2021 1144   APPEARANCEUR HAZY (A) 05/26/2021 1144   LABSPEC 1.016 05/26/2021 1144   PHURINE 5.0 05/26/2021 1144   GLUCOSEU NEGATIVE 05/26/2021 1144   HGBUR NEGATIVE 05/26/2021 1144   BILIRUBINUR NEGATIVE 05/26/2021 1144   BILIRUBINUR n 03/05/2015 0947   KETONESUR NEGATIVE 05/26/2021 1144   PROTEINUR 100 (A) 05/26/2021 1144   UROBILINOGEN negative 03/05/2015 0947   NITRITE NEGATIVE 05/26/2021 1144   LEUKOCYTESUR NEGATIVE 05/26/2021 1144    Radiological Exams on Admission: CT HEAD WO CONTRAST (5MM)  Result Date: 07/12/2021 CLINICAL DATA:  75 year old male EXAM: CT HEAD WITHOUT CONTRAST TECHNIQUE: Contiguous axial images were obtained from the base of the skull through the vertex without intravenous contrast. COMPARISON:  MR 05/21/2021 FINDINGS: Brain: No acute intracranial hemorrhage. No midline shift. Encephalomalacia of the right temporoparietal region, as demonstrated on the prior MRI. Focal hypodensity in the left frontal cortex, corresponding to the diffusion abnormality on prior MRI. No unexpected or new regions of loss of the gray-white differential. Unremarkable appearance of the met trickles. Vascular: Intracranial atherosclerosis of anterior and posterior circulation. Skull: No acute fracture.  No aggressive bone lesion identified. Sinuses/Orbits: Unremarkable appearance of the orbits. Mastoid air cells clear. No middle ear effusion. No significant sinus disease. Other: None IMPRESSION: No acute intracranial abnormality. 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 Encephalomalacia of remote right temporoparietal infarct. Intracranial atherosclerosis. Electronically Signed   By: Corrie Mckusick D.O.   On: 07/12/2021 13:21   DG Chest Portable 1 View  Result Date: 07/12/2021 CLINICAL DATA:  Shortness of breath. EXAM: PORTABLE CHEST 1 VIEW COMPARISON:  CT angiogram March 31, 2021 FINDINGS:  The mediastinal contour is normal. Right central venous line is identified with distal tip in the superior vena cava. Mild cardiomegaly is noted. The lungs are clear. The visualized skeletal structures are unremarkable. IMPRESSION: No acute cardiopulmonary disease identified. Electronically Signed   By: Abelardo Diesel M.D.   On: 07/12/2021 12:23    EKG: Independently reviewed.  Chronic A. fib  Assessment/Plan Active Problems:   AKI (acute kidney injury) ([place])  (please populate well all problems here in Problem List. (For example, if  patient is on BP meds at home and you resume or decide to hold them, it is a problem that needs to be her. Same for CAD, COPD, HLD and so on)  Sepsis -Evidenced by new onset of fever, elevated lactate, endorgan damage of new AKI, suspected infection source is GI tract. -Discontinue vancomycin, continue cefepime, received 1 dose of Flagyl in the ED.  Send GI pathogen panel, blood culture, UA pending. -Blood pressure stabilized after 2.5 L IV bolus, continue maintenance IV fluid with bicarb drip for tonight.  AKI -Secondary to sepsis and GI loss. -IV bolus and maintenance IV fluid -Renal ultrasound.  HTN -Hold all home BP meds for today. -PRN hydralazine  Chronic A. Fib -Rate controlled, repeat CBC in the morning, if H&H stable, resume Eliquis tomorrow.  Fall/generalized weakness/deconditioning -PT evaluation.  Metastatic pancreatic cancer -Outpatient follow-up with oncologist.  DVT prophylaxis: Eliquis, starting tomorrow Code Status: DNR Family Communication: Wife at bedside Disposition Plan: Expect more than 2 midnight hospital stay to treat sepsis, AKI, likely will need SNF. Consults called: [facility]One Admission status: Tele admit   Lequita Halt MD [facility] Pager [phone]  07/12/2021, 4:18 PM

## 2021-07-12 NOTE — Progress Notes (Signed)
Lee Kim is a 75 y.o. male patient admitted. Awake, alert - oriented  X 4 - no acute distress noted.  VSS - Blood pressure 122/88, pulse 74, temperature 97.8 F (36.6 C), temperature source Oral, resp. rate 18, height 6\' 1"  (1.854 m), weight 77.1 kg, SpO2 100 %.    IV in place, occlusive dsg intact without redness.  Orientation to room, and floor completed.  Admission INP armband ID verified with patient/family, and in place.   SR up x 2, fall assessment complete, with patient and family able to verbalize understanding of risk associated with falls, and verbalized understanding to call nsg before up out of bed.  Call light within reach, patient able to voice, and demonstrate understanding. No evidence of skin break down noted on exam.      Will cont to eval and treat per MD orders.  Vidal Schwalbe, RN 07/12/2021 8:16 PM

## 2021-07-12 NOTE — ED Notes (Signed)
Dinner Ordered 

## 2021-07-12 NOTE — ED Notes (Signed)
Attempted to call report, unable at this time, call back number given.

## 2021-07-12 NOTE — ED Provider Notes (Signed)
[facility] EMERGENCY DEPARTMENT Provider Note   CSN: [id number] Arrival date & time: 07/12/21  1123     History No chief complaint on file.   Lee Kim is a 74 y.o. male with a medical history of pancreatic adenocarcinoma with metastasis currently on chemotherapy (last received chemotherapy 3 days prior, Gem/Abraxane), permanent atrial fibrillation (on Eliquis), hypertension, thoracic aortic aneurysm without rupture.    Patient presents emergency department with a chief complaint of generalized weakness, tachycardia, and shortness of breath.  This morning while ambulating to bathroom patient started having generalized weakness became lightheaded and slowly slid to the floor.  Patient denies any his head or any loss of consciousness.  No chest pain or shortness of breath at this time.  EMS reports that upon their arrival patient was pale and diaphoretic.  Patient was noted to have systolic blood pressure of 78 and was tachycardic and 180s with atrial fibrillation rhythm.  Patient received 1 L fluid bolus with EMS.  Patient reports that he generalized weakness over the last 3 days.  Over this time he is also had nausea, vomiting entire diarrhea.  Patient denies any blood in stool, melena, hematemesis or coffee-ground emesis.  Reports that nausea, vomiting, and diarrhea are common after he receives chemotherapy infusion.  Patient reports that shortness of breath has been present over the last 4 weeks.  Shortness breath has been becoming progressively worse over this time.  Patient reports that he has shortness of breath with exertion, at rest, and orthopnea.  Patient endorses minimally productive cough.  Patient is unsure what sputum production looks like.  Denies any hemoptysis.  No chest pain associated with his shortness of breath.  Denies any chest pain, shortness of breath, leg swelling or tenderness.    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 ([place]) 05/07/2021   Homonymous bilateral field defects 04/30/2021   Atherosclerosis of coronary artery of native heart without angina pectoris 04/08/2021   Mediastinal adenopathy 04/08/2021   Intermediate stage nonexudative age-related macular degeneration of both eyes 12/31/2020   Branch retinal vein occlusion, right eye 11/19/2020   Branch retinal vein occlusion, left eye 11/19/2020   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   History of retinal detachment 11/19/2020   Erectile dysfunction 01/23/2019   Multinodular goiter 01/23/2019   Atherosclerosis of aorta ([place]) 01/23/2019   Ascending aortic aneurysm 01/23/2019   Glucose intolerance (impaired glucose tolerance) 08/24/2016   Chronic allergic rhinitis 08/11/2016   Alcohol use disorder, mild, abuse 08/11/2016   History of colonic polyps 08/11/2016   Unifocal PVCs 08/11/2016   Atrial fibrillation ([place]) 08/11/2016   OSA on CPAP 04/27/2010   Obesity 04/24/2010   ERYTHROCYTOSIS 04/24/2010   Essential hypertension 04/24/2010    Past Surgical History:  Procedure Laterality Date   COLONOSCOPY  03/2003   Dr. Wynetta Emery   COLONOSCOPY WITH PROPOFOL N/A 11/23/2016   Procedure: COLONOSCOPY WITH PROPOFOL;  Surgeon: Garlan Fair, MD;  Location: [facility];  Service: Endoscopy;  Laterality: N/A;   EYE SURGERY     IR IMAGING GUIDED PORT INSERTION  05/15/2021       Family History  Problem  Relation Age of Onset   Hypertension Mother    Hypertension Father    Hypertension Sister     Social History   Tobacco Use   Smoking status: Every Day    Types: Cigarettes   Smokeless tobacco: Never  Vaping Use   Vaping Use: Never used  Substance Use Topics   Alcohol use: Yes     Alcohol/week: 7.0 standard drinks    Types: 7 Glasses of wine per week    Comment: 3-4 glasses wine per night   Drug use: No    Home Medications Prior to Admission medications   Medication Sig Start Date End Date Taking? Authorizing Provider  amLODipine (NORVASC) 10 MG tablet TAKE 1 TABLET(10 MG) BY MOUTH DAILY 06/18/21   [name], MD  cromolyn (NASALCROM) 5.2 MG/ACT nasal spray Place 1 spray into both nostrils 2 (two) times daily as needed for allergies.    [provider]  ELIQUIS 5 MG TABS tablet TAKE 1 TABLET(5 MG) BY MOUTH TWICE DAILY 06/18/21   [name], MD  Gemcitabine HCl (GEMZAR IV) Inject 1 Bag into the vein once a week. Pt gets for 30 minutes once per week    [provider]  lidocaine-prilocaine (EMLA) cream Apply 1 application topically as needed. 05/07/21   Orson Slick, MD  lisinopril-hydrochlorothiazide (ZESTORETIC) 10-12.5 MG tablet TAKE 1 TABLET BY MOUTH DAILY 06/11/21   [name], MD  Multiple Vitamins-Minerals (PRESERVISION AREDS 2 PO) Take 1 tablet by mouth 2 (two) times daily.     [provider]  ondansetron (ZOFRAN) 8 MG tablet TAKE 1 TABLET(8 MG) BY MOUTH EVERY 8 HOURS AS NEEDED FOR NAUSEA OR VOMITING 06/19/21   Orson Slick, MD  PACLitaxel Protein-Bound Part (ABRAXANE IV) Inject 1 Bag into the vein once a week. Pt gets for 30 minutes once per week    [provider]  prochlorperazine (COMPAZINE) 10 MG tablet TAKE 1 TABLET(10 MG) BY MOUTH EVERY 6 HOURS AS NEEDED FOR NAUSEA OR VOMITING 06/19/21   Orson Slick, MD  psyllium (METAMUCIL) 58.6 % packet Take 1 packet by mouth daily.    [provider]  rosuvastatin (CRESTOR) 40 MG tablet Take 40 mg by mouth every evening.    [provider]  tadalafil (CIALIS) 20 MG tablet TAKE 1 TABLET BY MOUTH EVERY DAY AS NEEDED Patient taking differently: Take 10 mg by mouth daily as needed for erectile dysfunction. 06/11/20   [name], MD  TRAVATAN Z  0.004 % SOLN ophthalmic solution Place 1 drop into the right eye at bedtime. Travapost 07/08/16   [provider]  triamcinolone (KENALOG) 0.1 % APPLY EXTERNALLY TO THE AFFECTED AREA TWICE DAILY Patient taking differently: Apply 1 application topically daily as needed (psoriasis). 10/14/20   [name], MD    Allergies    Benzalkonium chloride  Review of Systems   Review of Systems  Constitutional:  Negative for chills and fever.  Eyes:  Negative for visual disturbance.  Respiratory:  Positive for cough and shortness of breath.   Cardiovascular:  Negative for chest pain, palpitations and leg swelling.  Gastrointestinal:  Positive for diarrhea, nausea and vomiting. Negative for abdominal pain.  Genitourinary:  Negative for difficulty urinating.  Musculoskeletal:  Negative for back pain and neck pain.  Skin:  Negative for color change and rash.  Allergic/Immunologic: Positive for immunocompromised state.  Neurological:  Positive for weakness (Generalized) and light-headedness. Negative for dizziness, syncope and  headaches.  Psychiatric/Behavioral:  Negative for confusion.    Physical Exam Updated Vital Signs BP 103/67 (BP Location: Left Arm)   Pulse (!) 118   Temp 100.3 F (37.9 C) (Rectal)   Resp 18   Ht '6\' 1"'  (1.854 m)   Wt 77.1 kg   SpO2 96%   BMI 22.43 kg/m   Physical Exam Vitals and nursing note reviewed.  Constitutional:      General: He is not in acute distress.    Appearance: He is not ill-appearing, toxic-appearing or diaphoretic.  HENT:     Head: Normocephalic.  Eyes:     General: No scleral icterus.       Right eye: No discharge.        Left eye: No discharge.  Cardiovascular:     Rate and Rhythm: Tachycardia present. Rhythm irregularly irregular.     Pulses:          Radial pulses are 2+ on the right side and 2+ on the left side.     Comments: Tachycardic at rate of 118 Pulmonary:     Effort: Pulmonary effort is normal. No tachypnea, bradypnea  or respiratory distress.     Breath sounds: Normal breath sounds. No stridor.  Abdominal:     General: There is no distension. There are no signs of injury.     Palpations: There is no mass or pulsatile mass.     Tenderness: There is no abdominal tenderness. There is no guarding or rebound.     Hernia: There is no hernia in the umbilical area or ventral area.  Musculoskeletal:     Right lower leg: Normal.     Left lower leg: Normal.  Skin:    General: Skin is warm and dry.  Neurological:     General: No focal deficit present.     Mental Status: He is alert.     GCS: GCS eye subscore is 4. GCS verbal subscore is 5. GCS motor subscore is 6.     Comments: No facial asymmetry or dysarthria.  Patient able to move all limbs without difficulty.  Psychiatric:        Behavior: Behavior is cooperative.    ED Results / Procedures / Treatments   Labs (all labs ordered are listed, but only abnormal results are displayed) Labs Reviewed  COMPREHENSIVE METABOLIC PANEL - Abnormal; Notable for the following components:      Result Value   Potassium 3.4 (*)    CO2 16 (*)    Glucose, Bld 138 (*)    Creatinine, Ser 2.27 (*)    Calcium 8.1 (*)    Total Protein 5.5 (*)    Albumin 2.4 (*)    AST 86 (*)    ALT 73 (*)    Alkaline Phosphatase 213 (*)    GFR, Estimated 30 (*)    All other components within normal limits  CBC WITH DIFFERENTIAL/PLATELET - Abnormal; Notable for the following components:   RBC 2.38 (*)    Hemoglobin 8.4 (*)    HCT 26.4 (*)    MCV 110.9 (*)    MCH 35.3 (*)    RDW 23.1 (*)    Platelets 137 (*)    nRBC 13.3 (*)    Lymphs Abs 0.5 (*)    Monocytes Absolute 1.1 (*)    Abs Immature Granulocytes 0.27 (*)    All other components within normal limits  BRAIN NATRIURETIC PEPTIDE - Abnormal; Notable for the following components:   B Natriuretic  Peptide 249.5 (*)    All other components within normal limits  LACTIC ACID, PLASMA - Abnormal; Notable for the following  components:   Lactic Acid, Venous 6.4 (*)    All other components within normal limits  PROTIME-INR - Abnormal; Notable for the following components:   Prothrombin Time 20.6 (*)    INR 1.8 (*)    All other components within normal limits  APTT - Abnormal; Notable for the following components:   aPTT 38 (*)    All other components within normal limits  URINE CULTURE  CULTURE, BLOOD (ROUTINE X 2)  CULTURE, BLOOD (ROUTINE X 2)  RESP PANEL BY RT-PCR (FLU A&B, COVID) ARPGX2  GASTROINTESTINAL PANEL BY PCR, STOOL (REPLACES STOOL CULTURE)  LACTIC ACID, PLASMA  URINALYSIS, ROUTINE W REFLEX MICROSCOPIC  PATHOLOGIST SMEAR REVIEW  CK  MAGNESIUM  PHOSPHORUS    EKG None  Radiology CT HEAD WO CONTRAST (5MM)  Result Date: 07/12/2021 CLINICAL DATA:  75 year old male EXAM: CT HEAD WITHOUT CONTRAST TECHNIQUE: Contiguous axial images were obtained from the base of the skull through the vertex without intravenous contrast. COMPARISON:  MR 05/21/2021 FINDINGS: Brain: No acute intracranial hemorrhage. No midline shift. Encephalomalacia of the right temporoparietal region, as demonstrated on the prior MRI. Focal hypodensity in the left frontal cortex, corresponding to the diffusion abnormality on prior MRI. No unexpected or new regions of loss of the gray-white differential. Unremarkable appearance of the met trickles. Vascular: Intracranial atherosclerosis of anterior and posterior circulation. Skull: No acute fracture.  No aggressive bone lesion identified. Sinuses/Orbits: Unremarkable appearance of the orbits. Mastoid air cells clear. No middle ear effusion. No significant sinus disease. Other: None IMPRESSION: No acute intracranial abnormality. 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 Encephalomalacia of remote right temporoparietal infarct. Intracranial atherosclerosis. Electronically Signed   By: Corrie Mckusick D.O.   On: 07/12/2021 13:21   DG Chest  Portable 1 View  Result Date: 07/12/2021 CLINICAL DATA:  Shortness of breath. EXAM: PORTABLE CHEST 1 VIEW COMPARISON:  CT angiogram March 31, 2021 FINDINGS: The mediastinal contour is normal. Right central venous line is identified with distal tip in the superior vena cava. Mild cardiomegaly is noted. The lungs are clear. The visualized skeletal structures are unremarkable. IMPRESSION: No acute cardiopulmonary disease identified. Electronically Signed   By: Abelardo Diesel M.D.   On: 07/12/2021 12:23    Procedures .Critical Care Performed by: Loni Beckwith, PA-C Authorized by: Loni Beckwith, PA-C   Critical care provider statement:    Critical care time (minutes):  45   Critical care was necessary to treat or prevent imminent or life-threatening deterioration of the following conditions:  Sepsis   Critical care was time spent personally by me on the following activities:  Evaluation of patient's response to treatment, examination of patient, ordering and performing treatments and interventions, ordering and review of laboratory studies, ordering and review of radiographic studies, pulse oximetry, re-evaluation of patient's condition, obtaining history from patient or surrogate and review of old charts   Care discussed with: admitting provider     Medications Ordered in ED Medications  lactated ringers bolus 1,000 mL (1,000 mLs Intravenous Not Given 07/12/21 1312)  vancomycin (VANCOREADY) IVPB 1500 mg/300 mL (has no administration in time range)  ceFEPIme (MAXIPIME) 2 g in sodium chloride 0.9 % 100 mL IVPB (has no administration in time range)  lactobacillus acidophilus (BACID) tablet 2 tablet (has no administration in time range)  rosuvastatin (CRESTOR) tablet 40  mg (has no administration in time range)  apixaban (ELIQUIS) tablet 2.5 mg (has no administration in time range)  cromolyn (NASALCROM) nasal spray 1 spray (has no administration in time range)  acetaminophen (TYLENOL)  tablet 650 mg (has no administration in time range)    Or  acetaminophen (TYLENOL) suppository 650 mg (has no administration in time range)  ondansetron (ZOFRAN) tablet 4 mg (has no administration in time range)    Or  ondansetron (ZOFRAN) injection 4 mg (has no administration in time range)  sodium bicarbonate 75 mEq in sodium chloride 0.45 % 1,075 mL infusion (has no administration in time range)  potassium chloride SA (KLOR-CON) CR tablet 40 mEq (has no administration in time range)  ceFEPIme (MAXIPIME) 2 g in sodium chloride 0.9 % 100 mL IVPB (2 g Intravenous New Bag/Given 07/12/21 1310)  metroNIDAZOLE (FLAGYL) IVPB 500 mg (500 mg Intravenous New Bag/Given 07/12/21 1310)  lactated ringers bolus 1,500 mL (1,500 mLs Intravenous New Bag/Given 07/12/21 1205)    ED Course  I have reviewed the triage vital signs and the nursing notes.  Pertinent labs & imaging results that were available during my care of the patient were reviewed by me and considered in my medical decision making (see chart for details).  Clinical Course as of 07/12/21 1715  Sun Jul 12, 2021  1544 Spoke with Dr. Roosevelt Locks who will see the patient for admission. [PB]    Clinical Course User Index [PB] Dyann Ruddle   MDM Rules/Calculators/A&P                           Alert 75 year old male no acute stress, nontoxic-appearing.  Presents emergency department with a chief complaint of weakness, breath, and tachycardia.  Patient has pancreatic adenocarcinoma with metastasis is currently receiving chemotherapyLast infusion 3 days prior.  Upon EMS arrival patient was pale, diaphoretic, tachycardic with rate in the 180s and hypotensive with systolic blood pressure of 78.  Patient received 1 L normal saline fluid bolus with EMS.    Upon assessment patient's heart rate 118, blood pressure 103/67.  Rectal temperature of 100.3 F.  Labs on 9/29 shows absolute neutrophil count of 0.8.  Concern for sepsis will give patient 1.5  L of lactated Ringer's to complete 30 mL/kg fluid bolus.  Patient was started empirically on cefepime, Flagyl, and vancomycin.  ED evolving sepsis work-up ordered.  Will obtain noncontrast CT head due to patient's report of sliding to the floor and being on blood thinners.  EKG shows A. fib with RVR with ventricular rate of 130; suspect tachycardia is secondary to physiological response from infection will continue to monitor as patient receives IV fluids.  On serial reexamination patient has improvement in heart rate and blood pressure therefore no medications were given for rate control.  CBC shows white count of 4.9, neutrophils and neutrophil number within normal limits.  Patient is anemic however.  Baseline per previous lab testing. CMP shows AKI with creatinine at 2.27; unclear if this is due to patient's septic presentation or vomiting and diarrhea over the last 3 days. Chest x-ray shows no signs of infection. Lactic acid 6.4 Urinalysis still pending at this time, in and out catheter was ordered to obtain sample.  Will consult hospitalist for admission due to sepsis presentation although exact source of infection unknown at this time.  Patient was discussed with and evaluated by Dr. Vanita Panda.     Final Clinical Impression(s) / ED Diagnoses  Final diagnoses:  None    Rx / DC Orders ED Discharge Orders     None        Dyann Ruddle 07/12/21 1722    Carmin Muskrat, MD 07/16/21 2314

## 2021-07-13 ENCOUNTER — Encounter (HOSPITAL_COMMUNITY): Payer: Self-pay | Admitting: Internal Medicine

## 2021-07-13 ENCOUNTER — Telehealth: Payer: Self-pay | Admitting: *Deleted

## 2021-07-13 ENCOUNTER — Inpatient Hospital Stay (HOSPITAL_COMMUNITY): Payer: PPO

## 2021-07-13 LAB — CBC
HCT: 23.6 % — ABNORMAL LOW (ref 39.0–52.0)
HCT: 24.1 % — ABNORMAL LOW (ref 39.0–52.0)
Hemoglobin: 7.8 g/dL — ABNORMAL LOW (ref 13.0–17.0)
Hemoglobin: 7.8 g/dL — ABNORMAL LOW (ref 13.0–17.0)
MCH: 35.3 pg — ABNORMAL HIGH (ref 26.0–34.0)
MCH: 35.1 pg — ABNORMAL HIGH (ref 26.0–34.0)
MCHC: 33.1 g/dL (ref 30.0–36.0)
MCHC: 32.4 g/dL (ref 30.0–36.0)
MCV: 106.8 fL — ABNORMAL HIGH (ref 80.0–100.0)
MCV: 108.6 fL — ABNORMAL HIGH (ref 80.0–100.0)
Platelets: 154 10*3/uL (ref 150–400)
Platelets: 179 10*3/uL (ref 150–400)
RBC: 2.21 MIL/uL — ABNORMAL LOW (ref 4.22–5.81)
RBC: 2.22 MIL/uL — ABNORMAL LOW (ref 4.22–5.81)
RDW: 23.1 % — ABNORMAL HIGH (ref 11.5–15.5)
RDW: 22.9 % — ABNORMAL HIGH (ref 11.5–15.5)
WBC: 4.4 10*3/uL (ref 4.0–10.5)
WBC: 3.7 10*3/uL — ABNORMAL LOW (ref 4.0–10.5)
nRBC: 4.1 % — ABNORMAL HIGH (ref 0.0–0.2)
nRBC: 2.4 % — ABNORMAL HIGH (ref 0.0–0.2)

## 2021-07-13 LAB — COMPREHENSIVE METABOLIC PANEL
ALT: 66 U/L — ABNORMAL HIGH (ref 0–44)
AST: 105 U/L — ABNORMAL HIGH (ref 15–41)
Albumin: 2.2 g/dL — ABNORMAL LOW (ref 3.5–5.0)
Alkaline Phosphatase: 166 U/L — ABNORMAL HIGH (ref 38–126)
Anion gap: 8 (ref 5–15)
BUN: 23 mg/dL (ref 8–23)
CO2: 21 mmol/L — ABNORMAL LOW (ref 22–32)
Calcium: 7.8 mg/dL — ABNORMAL LOW (ref 8.9–10.3)
Chloride: 111 mmol/L (ref 98–111)
Creatinine, Ser: 2.01 mg/dL — ABNORMAL HIGH (ref 0.61–1.24)
GFR, Estimated: 34 mL/min — ABNORMAL LOW (ref >60)
Glucose, Bld: 99 mg/dL (ref 70–99)
Potassium: 3.3 mmol/L — ABNORMAL LOW (ref 3.5–5.1)
Sodium: 140 mmol/L (ref 135–145)
Total Bilirubin: 1.1 mg/dL (ref 0.3–1.2)
Total Protein: 4.8 g/dL — ABNORMAL LOW (ref 6.5–8.1)

## 2021-07-13 LAB — TYPE AND SCREEN
ABO/RH(D): A POS
Antibody Screen: NEGATIVE

## 2021-07-13 LAB — ABO/RH: ABO/RH(D): A POS

## 2021-07-13 LAB — LACTIC ACID, PLASMA: Lactic Acid, Venous: 1.7 mmol/L (ref 0.5–1.9)

## 2021-07-13 LAB — CK: Total CK: 2101 U/L — ABNORMAL HIGH (ref 49–397)

## 2021-07-13 LAB — GLUCOSE, CAPILLARY: Glucose-Capillary: 96 mg/dL (ref 70–99)

## 2021-07-13 MED ORDER — CHLORHEXIDINE GLUCONATE CLOTH 2 % EX PADS
6.0000 | MEDICATED_PAD | Freq: Every day | CUTANEOUS | Status: DC
  Administered 2021-07-14 – 2021-07-16 (×3): 6 via TOPICAL

## 2021-07-13 MED ORDER — FERROUS SULFATE 325 (65 FE) MG PO TABS
325.0000 mg | ORAL_TABLET | Freq: Two times a day (BID) | ORAL | Status: DC
  Administered 2021-07-13: 325 mg via ORAL
  Filled 2021-07-13: qty 1

## 2021-07-13 MED ORDER — MELATONIN 3 MG PO TABS
3.0000 mg | ORAL_TABLET | Freq: Every day | ORAL | Status: DC
  Administered 2021-07-13 – 2021-07-14 (×2): 3 mg via ORAL
  Filled 2021-07-13 (×2): qty 1

## 2021-07-13 MED ORDER — POTASSIUM CHLORIDE CRYS ER 20 MEQ PO TBCR
20.0000 meq | EXTENDED_RELEASE_TABLET | Freq: Once | ORAL | Status: AC
  Administered 2021-07-13: 20 meq via ORAL
  Filled 2021-07-13: qty 1

## 2021-07-13 MED ORDER — TAMSULOSIN HCL 0.4 MG PO CAPS
0.4000 mg | ORAL_CAPSULE | Freq: Every day | ORAL | Status: DC
  Administered 2021-07-13: 0.4 mg via ORAL
  Filled 2021-07-13: qty 1

## 2021-07-13 MED ORDER — FERROUS SULFATE 325 (65 FE) MG PO TABS
325.0000 mg | ORAL_TABLET | Freq: Every day | ORAL | Status: DC
  Administered 2021-07-14 – 2021-07-17 (×4): 325 mg via ORAL
  Filled 2021-07-13 (×4): qty 1

## 2021-07-13 MED ORDER — TAMSULOSIN HCL 0.4 MG PO CAPS
0.4000 mg | ORAL_CAPSULE | Freq: Two times a day (BID) | ORAL | Status: DC
  Administered 2021-07-13 – 2021-07-17 (×7): 0.4 mg via ORAL
  Filled 2021-07-13 (×8): qty 1

## 2021-07-13 MED ORDER — LACTATED RINGERS IV SOLN: INTRAVENOUS | Status: DC

## 2021-07-13 NOTE — Consult Note (Addendum)
[place]  [phone] [phone]   MEDICAL ONCOLOGY - CONSULTATION  Referral MD: Dr. Berle Mull  Reason for Consultation: Metastatic pancreatic adenocarcinoma  Hematological/Oncological History # Pancreatic Adenocarcinoma, Metastatic 03/31/2021: patient underwent routine CT angio chest for monitoring of his thoracic aortic aneurysm. Found to have enlargement of left supraclavicular lymph node with diffuse mediastinal and upper abdominal lymph nodes. 05/01/2021: [place] core biopsy of left supraclavicular lymph node. Findings consistent with metastatic adenocarcinoma. Lung vs pancreatic origin 05/06/2021: CT abdomen performed. Formal read pending. 05/07/2021: establish care with Dr. Lorenso Courier  05/21/2021: Cycle 1 Day 1 of Gem/Abraxane 05/28/2021: Cycle 1 Day 8 HELD due to cytopenias and poor renal function 06/12/2021: Cycle 1 Day 22 of Gem/Abraxane (extra week due to missed week on 05/28/2021) 06/25/2021: Cycle 2 Day 1 of Gem/Abraxane 07/02/2021: Cycle 2 Day 8 of Gem/Abraxane 07/08/2021: Cycle 2-day 15 - CANCELLED per patient due to "stomach concerns"  HPI: Lee Kim is a 75 male with a past medical history significant for metastatic pancreatic cancer, hypertension, PAF on Eliquis, chronic macrocytic anemia.  He came into the emergency department for evaluation due to weakness, diarrhea, and fall.  He has been receiving systemic chemotherapy for his metastatic pancreatic cancer.  He received day 1 of cycle #2 on 06/25/2021.  In the emergency department, he had a low-grade fever of 100.3.  Blood work showed lactic acidosis with a lactic acid level of 6.  His creatinine was 2.2 (baseline 1.5-1.9), WBC was 4.9, hemoglobin 8.4, AST 86, and ALT 73.  Renal ultrasound showed bilateral renal cyst but otherwise normal appearance of the kidneys.  Stool for C. difficile and GI panel have been ordered and are pending.  Blood cultures negative to date.  I met with the patient and his  wife in his hospital room.  The patient reports that he has not had any diarrhea since hospital admission.  The stool for C. difficile and GI panel have not been collected.  He continues on IV hydration and IV antibiotics.  He has been afebrile over the past 24 hours.  He reports some mild abdominal discomfort in the left upper quadrant.  He is not currently having any nausea or vomiting.  In general, he reports that he feels weak and in particular his legs feel weak.  He has not been out of bed.  Wife is asking about the possibility of arranging DME in the home such as a hospital bed.  Medical oncology was asked see the patient for recommendations regarding his metastatic pancreatic adenocarcinoma.  Past Medical History:  Diagnosis Date   Colonic polyp    Diverticulitis    Erythrocytosis    Hypertension    Obesity    OSA (obstructive sleep apnea)   :   Past Surgical History:  Procedure Laterality Date   COLONOSCOPY  03/2003   Dr. Wynetta Emery   COLONOSCOPY WITH PROPOFOL N/A 11/23/2016   Procedure: COLONOSCOPY WITH PROPOFOL;  Surgeon: Garlan Fair, MD;  Location: [facility];  Service: Endoscopy;  Laterality: N/A;   EYE SURGERY     IR IMAGING GUIDED PORT INSERTION  05/15/2021  :   Current Facility-Administered Medications  Medication Dose Route Frequency Provider Last Rate Last Admin   acetaminophen (TYLENOL) tablet 650 mg  650 mg Oral Q6H PRN Wynetta Fines T, MD       Or   acetaminophen (TYLENOL) suppository 650 mg  650 mg Rectal Q6H PRN Lequita Halt, MD       acidophilus (  RISAQUAD) capsule 2 capsule  2 capsule Oral TID Wynetta Fines T, MD   2 capsule at 07/13/21 1005   apixaban (ELIQUIS) tablet 2.5 mg  2.5 mg Oral BID Wynetta Fines T, MD   2.5 mg at 07/13/21 1004   ceFEPIme (MAXIPIME) 2 g in sodium chloride 0.9 % 100 mL IVPB  2 g Intravenous Q12H Heloise Purpura, RPH 200 mL/hr at 07/13/21 0252 2 g at 07/13/21 0252   cromolyn (NASALCROM) nasal spray 1 spray  1 spray Each Nare BID PRN  Lequita Halt, MD       [START ON 07/14/2021] ferrous sulfate tablet 325 mg  325 mg Oral Q breakfast Lavina Hamman, MD       hydrALAZINE (APRESOLINE) tablet 25 mg  25 mg Oral Q6H PRN Lequita Halt, MD       lactated ringers infusion   Intravenous Continuous Lavina Hamman, MD 125 mL/hr at 07/13/21 0803 New Bag at 07/13/21 0803   loperamide (IMODIUM) capsule 2 mg  2 mg Oral PRN Wynetta Fines T, MD   2 mg at 07/12/21 1845   metoprolol tartrate (LOPRESSOR) injection 2.5 mg  2.5 mg Intravenous Q6H PRN Lequita Halt, MD       ondansetron [facility]) tablet 4 mg  4 mg Oral Q6H PRN Wynetta Fines T, MD       Or   ondansetron [facility]) injection 4 mg  4 mg Intravenous Q6H PRN Wynetta Fines T, MD       tamsulosin [facility]) capsule 0.4 mg  0.4 mg Oral Daily Berle Mull M, MD   0.4 mg at 07/13/21 1004      Allergies  Allergen Reactions   Benzalkonium Chloride     Causes Eye issues Type of preservative   :   Family History  Problem Relation Age of Onset   Hypertension Mother    Hypertension Father    Hypertension Sister   :   Social History   Socioeconomic History   Marital status: Married    Spouse name: Not on file   Number of children: 1   Years of education: Not on file   Highest education level: Not on file  Occupational History   Not on file  Tobacco Use   Smoking status: Every Day    Types: Cigarettes   Smokeless tobacco: Never  Vaping Use   Vaping Use: Never used  Substance and Sexual Activity   Alcohol use: Yes    Alcohol/week: 7.0 standard drinks    Types: 7 Glasses of wine per week    Comment: 3-4 glasses wine per night   Drug use: No   Sexual activity: Yes  Other Topics Concern   Not on file  Social History Narrative   Not on file   Social Determinants of Health   Financial Resource Strain: Not on file  Food Insecurity: Not on file  Transportation Needs: Not on file  Physical Activity: Not on file  Stress: Not on file  Social Connections: Not on file   Intimate Partner Violence: Not on file  :  Review of Systems: A comprehensive 14 point review of systems was negative except as noted in the HPI.  Exam: Patient Vitals for the past 24 hrs:  BP Temp Temp src Pulse Resp SpO2  07/13/21 0503 (!) 128/95 97.7 F (36.5 C) Oral 73 18 100 %  07/13/21 0014 113/77 97.6 F (36.4 C) -- 69 18 100 %  07/12/21 2015 122/88 97.8 F (36.6  C) Oral 74 18 100 %  07/12/21 1938 -- -- -- -- -- 99 %  07/12/21 1900 129/76 -- -- 65 (!) 25 100 %  07/12/21 1845 (!) 104/91 -- -- 91 19 99 %  07/12/21 1636 120/79 -- -- 92 (!) 24 99 %  07/12/21 1450 121/68 -- -- 96 (!) 22 98 %  07/12/21 1440 126/81 -- -- 84 (!) 21 100 %  07/12/21 1430 130/75 -- -- 84 16 99 %    General: Lying in bed, awake and alert Eyes:  no scleral icterus.   ENT:  There were no oropharyngeal lesions.   Respiratory: lungs were clear bilaterally without wheezing or crackles.   Cardiovascular:  Regular rate and rhythm, S1/S2, without murmur, rub or gallop.  There was no pedal edema.   GI:  abdomen was soft, flat, nontender, nondistended, without organomegaly.    Skin exam was without echymosis, petichae.   Neuro exam was nonfocal. Patient was alert and oriented.  Attention was good.   Language was appropriate.  Mood was normal without depression.  Speech was not pressured.  Thought content was not tangential.     Lab Results  Component Value Date   WBC 4.4 07/13/2021   HGB 7.8 (L) 07/13/2021   HCT 23.6 (L) 07/13/2021   PLT 154 07/13/2021   GLUCOSE 99 07/13/2021   CHOL 118 06/11/2020   TRIG 80 06/11/2020   HDL 53 06/11/2020   LDLCALC 49 06/11/2020   ALT 66 (H) 07/13/2021   AST 105 (H) 07/13/2021   NA 140 07/13/2021   K 3.3 (L) 07/13/2021   CL 111 07/13/2021   CREATININE 2.01 (H) 07/13/2021   BUN 23 07/13/2021   CO2 21 (L) 07/13/2021    CT HEAD WO CONTRAST (5MM)  Result Date: 07/12/2021 CLINICAL DATA:  75 year old male EXAM: CT HEAD WITHOUT CONTRAST TECHNIQUE: Contiguous axial  images were obtained from the base of the skull through the vertex without intravenous contrast. COMPARISON:  MR 05/21/2021 FINDINGS: Brain: No acute intracranial hemorrhage. No midline shift. Encephalomalacia of the right temporoparietal region, as demonstrated on the prior MRI. Focal hypodensity in the left frontal cortex, corresponding to the diffusion abnormality on prior MRI. No unexpected or new regions of loss of the gray-white differential. Unremarkable appearance of the met trickles. Vascular: Intracranial atherosclerosis of anterior and posterior circulation. Skull: No acute fracture.  No aggressive bone lesion identified. Sinuses/Orbits: Unremarkable appearance of the orbits. Mastoid air cells clear. No middle ear effusion. No significant sinus disease. Other: None IMPRESSION: No acute intracranial abnormality. 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 Encephalomalacia of remote right temporoparietal infarct. Intracranial atherosclerosis. Electronically Signed   By: Corrie Mckusick D.O.   On: 07/12/2021 13:21   US RENAL  Result Date: 07/12/2021 CLINICAL DATA:  Acute renal insufficiency. EXAM: RENAL / URINARY TRACT ULTRASOUND COMPLETE COMPARISON:  Abdominal CT May 06, 2021 FINDINGS: Right Kidney: Renal measurements: 13.2 x 5.6 x 4.6 cm = volume: 177.4 mL. Echogenicity within normal limits. No hydronephrosis visualized. Two benign-appearing cysts in the right kidney measure 1.6 and 0.9 cm. Left Kidney: Renal measurements: 10.8 x 4.2 x 4.2 cm = volume: 99.3 mL. Echogenicity within normal limits. No hydronephrosis visualized. Two benign-appearing cysts in the left kidney measuring 1.6 and 1.7 cm. Bladder: Decompressed and therefore poorly evaluated. Other: None. IMPRESSION: Bilateral renal cysts, otherwise normal appearance of the kidneys. Electronically Signed   By: Fidela Salisbury M.D.   On: 07/12/2021 17:27  DG Chest Portable 1 View  Result Date:  07/12/2021 CLINICAL DATA:  Shortness of breath. EXAM: PORTABLE CHEST 1 VIEW COMPARISON:  CT angiogram March 31, 2021 FINDINGS: The mediastinal contour is normal. Right central venous line is identified with distal tip in the superior vena cava. Mild cardiomegaly is noted. The lungs are clear. The visualized skeletal structures are unremarkable. IMPRESSION: No acute cardiopulmonary disease identified. Electronically Signed   By: Abelardo Diesel M.D.   On: 07/12/2021 12:23     CT HEAD WO CONTRAST (5MM)  Result Date: 07/12/2021 CLINICAL DATA:  75 year old male EXAM: CT HEAD WITHOUT CONTRAST TECHNIQUE: Contiguous axial images were obtained from the base of the skull through the vertex without intravenous contrast. COMPARISON:  MR 05/21/2021 FINDINGS: Brain: No acute intracranial hemorrhage. No midline shift. Encephalomalacia of the right temporoparietal region, as demonstrated on the prior MRI. Focal hypodensity in the left frontal cortex, corresponding to the diffusion abnormality on prior MRI. No unexpected or new regions of loss of the gray-white differential. Unremarkable appearance of the met trickles. Vascular: Intracranial atherosclerosis of anterior and posterior circulation. Skull: No acute fracture.  No aggressive bone lesion identified. Sinuses/Orbits: Unremarkable appearance of the orbits. Mastoid air cells clear. No middle ear effusion. No significant sinus disease. Other: None IMPRESSION: No acute intracranial abnormality. 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 Encephalomalacia of remote right temporoparietal infarct. Intracranial atherosclerosis. Electronically Signed   By: Corrie Mckusick D.O.   On: 07/12/2021 13:21   US RENAL  Result Date: 07/12/2021 CLINICAL DATA:  Acute renal insufficiency. EXAM: RENAL / URINARY TRACT ULTRASOUND COMPLETE COMPARISON:  Abdominal CT May 06, 2021 FINDINGS: Right Kidney: Renal measurements: 13.2 x 5.6 x 4.6 cm =  volume: 177.4 mL. Echogenicity within normal limits. No hydronephrosis visualized. Two benign-appearing cysts in the right kidney measure 1.6 and 0.9 cm. Left Kidney: Renal measurements: 10.8 x 4.2 x 4.2 cm = volume: 99.3 mL. Echogenicity within normal limits. No hydronephrosis visualized. Two benign-appearing cysts in the left kidney measuring 1.6 and 1.7 cm. Bladder: Decompressed and therefore poorly evaluated. Other: None. IMPRESSION: Bilateral renal cysts, otherwise normal appearance of the kidneys. Electronically Signed   By: Fidela Salisbury M.D.   On: 07/12/2021 17:27   DG Chest Portable 1 View  Result Date: 07/12/2021 CLINICAL DATA:  Shortness of breath. EXAM: PORTABLE CHEST 1 VIEW COMPARISON:  CT angiogram March 31, 2021 FINDINGS: The mediastinal contour is normal. Right central venous line is identified with distal tip in the superior vena cava. Mild cardiomegaly is noted. The lungs are clear. The visualized skeletal structures are unremarkable. IMPRESSION: No acute cardiopulmonary disease identified. Electronically Signed   By: Abelardo Diesel M.D.   On: 07/12/2021 12:23    Assessment and Plan:  Lee Kim 75 y.o. male with medical history significant for metastatic adenocarcinoma of the pancreas.  He has been receiving systemic chemotherapy with gemcitabine and Abraxane on day 1, 8, and 15 of a 28-day cycle.  He last received day 8 of cycle 2 of chemotherapy on 07/02/2021 but canceled day 15 due to issues with his stomach.  He is now admitted with sepsis and AKI.  He is currently receiving IV fluids and IV antibiotics.  Stool for C. difficile and GI panel were ordered but is have not been collected since he has not had any further diarrhea since admission.  Creatinine slowly improving with IV hydration.  The patient has generalized weakness/deconditioning.   # Metastatic Adenocarcinoma of  the Pancreas -- Findings at this time are most consistent with metastatic adenocarcinoma the  pancreas.   -- Tumor markers collected on 05/07/2021 show an AFP of 2.7, CA 19-9 of 1870, and a CEA of 75.47 --Regimen of choice this time would be gemcitabine and Abraxane --We will order BRCA 1 and 2 mutational status to see if there are options for further therapy --Cycle 1 Day 1 of Gem/Abraxane started on on 05/21/2021 --Chemotherapy will be placed on hold pending resolution of his AKI -- May need to consider transitioning to monotherapy gemcitabine alone if patient is unable to tolerate the doublet treatment -- He is tentatively scheduled for an outpatient follow-up visit in our office on 10/12 and we will keep this as scheduled for now but will cancel if he remains in the hospital   #Anemia, chemotherapy induced --Hemoglobin currently 7.8 --continue to monitor   #Sepsis --The patient presented with fever, elevated lactic acid, new AKI, suspected source of infection of the GI tract --Currently receiving antibiotics per hospitalist --Send GI panel and stool for C. difficile if able  ##AKI --Secondary to sepsis and GI loss --Creatinine slowly improving with IV fluid --Continue IV fluids per hospitalist  ##Protein calorie malnutrition --Recommend dietitian consultation  ##Deconditioning --He reports generalized weakness/leg weakness following each chemotherapy --Recommend PT/OT consultation --May need DME upon discharge  #Supportive Care -- He has ondansetron available to him --Using loperamide for diarrhea (?hold if concern for c diff/GI panel to allow for stool specimen collection)  Mikey Bussing, DNP, AGPCNP-BC, AOCNP

## 2021-07-13 NOTE — Telephone Encounter (Signed)
Mrs Proto called to say that Lee Kim is in the hospital. Not sure how long they will keep him.  1.Was wondering what to do about appts on Wednesday. 2. Lee Sossamon said Dr Lorenso Courier has mentioned a "shot for weakness in legs". Wife is asking if that is something he could order?

## 2021-07-13 NOTE — Progress Notes (Signed)
In and out cath x 1 has been ordered. 355ml of urine out. Pt tolerated well.

## 2021-07-13 NOTE — Plan of Care (Signed)

## 2021-07-13 NOTE — Progress Notes (Signed)
Pt unable to urinate. No urine output since admitted from ED. Pt denies urge or any discomfort. Bladder scan showed 2ml repeatedly. Made provider on call aware.

## 2021-07-13 NOTE — Telephone Encounter (Signed)
LM with note below 

## 2021-07-13 NOTE — Telephone Encounter (Signed)
1) We will keep his appointment on the schedule, but can discontinue it if he is still admitted on Wednesday.  2) We recommended a shot to boost his immune system (GCSF shot). There is no shot for weakness, this was likely a misunderstanding.  We will see him in the hospital

## 2021-07-14 DIAGNOSIS — E440 Moderate protein-calorie malnutrition: Secondary | ICD-10-CM

## 2021-07-14 DIAGNOSIS — C259 Malignant neoplasm of pancreas, unspecified: Secondary | ICD-10-CM

## 2021-07-14 DIAGNOSIS — N179 Acute kidney failure, unspecified: Secondary | ICD-10-CM

## 2021-07-14 DIAGNOSIS — A419 Sepsis, unspecified organism: Secondary | ICD-10-CM

## 2021-07-14 DIAGNOSIS — C772 Secondary and unspecified malignant neoplasm of intra-abdominal lymph nodes: Secondary | ICD-10-CM

## 2021-07-14 LAB — CBC WITH DIFFERENTIAL/PLATELET
Abs Immature Granulocytes: 0.22 10*3/uL — ABNORMAL HIGH (ref 0.00–0.07)
Basophils Absolute: 0 10*3/uL (ref 0.0–0.1)
Basophils Relative: 1 %
Eosinophils Absolute: 0.2 10*3/uL (ref 0.0–0.5)
Eosinophils Relative: 5 %
HCT: 27.3 % — ABNORMAL LOW (ref 39.0–52.0)
Hemoglobin: 8.8 g/dL — ABNORMAL LOW (ref 13.0–17.0)
Immature Granulocytes: 5 %
Lymphocytes Relative: 16 %
Lymphs Abs: 0.7 10*3/uL (ref 0.7–4.0)
MCH: 35.9 pg — ABNORMAL HIGH (ref 26.0–34.0)
MCHC: 32.2 g/dL (ref 30.0–36.0)
MCV: 111.4 fL — ABNORMAL HIGH (ref 80.0–100.0)
Monocytes Absolute: 1.4 10*3/uL — ABNORMAL HIGH (ref 0.1–1.0)
Monocytes Relative: 31 %
Neutro Abs: 2 10*3/uL (ref 1.7–7.7)
Neutrophils Relative %: 42 %
Platelets: 224 10*3/uL (ref 150–400)
RBC: 2.45 MIL/uL — ABNORMAL LOW (ref 4.22–5.81)
RDW: 23.7 % — ABNORMAL HIGH (ref 11.5–15.5)
WBC: 4.6 10*3/uL (ref 4.0–10.5)
nRBC: 2.4 % — ABNORMAL HIGH (ref 0.0–0.2)

## 2021-07-14 LAB — COMPREHENSIVE METABOLIC PANEL
ALT: 64 U/L — ABNORMAL HIGH (ref 0–44)
AST: 99 U/L — ABNORMAL HIGH (ref 15–41)
Albumin: 2.2 g/dL — ABNORMAL LOW (ref 3.5–5.0)
Alkaline Phosphatase: 175 U/L — ABNORMAL HIGH (ref 38–126)
Anion gap: 10 (ref 5–15)
BUN: 20 mg/dL (ref 8–23)
CO2: 20 mmol/L — ABNORMAL LOW (ref 22–32)
Calcium: 8.2 mg/dL — ABNORMAL LOW (ref 8.9–10.3)
Chloride: 109 mmol/L (ref 98–111)
Creatinine, Ser: 1.7 mg/dL — ABNORMAL HIGH (ref 0.61–1.24)
GFR, Estimated: 42 mL/min — ABNORMAL LOW (ref >60)
Glucose, Bld: 123 mg/dL — ABNORMAL HIGH (ref 70–99)
Potassium: 3.5 mmol/L (ref 3.5–5.1)
Sodium: 139 mmol/L (ref 135–145)
Total Bilirubin: 0.6 mg/dL (ref 0.3–1.2)
Total Protein: 5.3 g/dL — ABNORMAL LOW (ref 6.5–8.1)

## 2021-07-14 LAB — C DIFFICILE QUICK SCREEN W PCR REFLEX
C Diff antigen: NEGATIVE
C Diff interpretation: NOT DETECTED
C Diff toxin: NEGATIVE

## 2021-07-14 LAB — MAGNESIUM: Magnesium: 1.7 mg/dL (ref 1.7–2.4)

## 2021-07-14 LAB — PATHOLOGIST SMEAR REVIEW

## 2021-07-14 LAB — CK: Total CK: 1209 U/L — ABNORMAL HIGH (ref 49–397)

## 2021-07-14 MED ORDER — LINEZOLID 600 MG PO TABS
600.0000 mg | ORAL_TABLET | Freq: Two times a day (BID) | ORAL | Status: DC
  Administered 2021-07-14 – 2021-07-15 (×2): 600 mg via ORAL
  Filled 2021-07-14 (×2): qty 1

## 2021-07-14 MED ORDER — POTASSIUM CHLORIDE CRYS ER 20 MEQ PO TBCR
40.0000 meq | EXTENDED_RELEASE_TABLET | Freq: Once | ORAL | Status: AC
  Administered 2021-07-14: 40 meq via ORAL
  Filled 2021-07-14: qty 2

## 2021-07-14 MED ORDER — ZOLPIDEM TARTRATE 5 MG PO TABS: 5.0000 mg | ORAL_TABLET | Freq: Every evening | ORAL | Status: DC | PRN

## 2021-07-14 MED ORDER — LOPERAMIDE HCL 2 MG PO CAPS
2.0000 mg | ORAL_CAPSULE | Freq: Once | ORAL | Status: AC
  Administered 2021-07-14: 2 mg via ORAL
  Filled 2021-07-14: qty 1

## 2021-07-14 NOTE — Care Management (Signed)
    Durable Medical Equipment  (From admission, onward)           Start     Ordered   07/14/21 1232  For home use only DME Hospital bed  Once       Question Answer Comment  Length of Need Lifetime   Patient has (list medical condition): Fall/generalized weakness/deconditioning   The above medical condition requires: Patient requires the ability to reposition frequently   Head must be elevated greater than: 45 degrees   Bed type Semi-electric   Support Surface: Gel Overlay      07/14/21 1231   07/14/21 1229  For home use only DME Walker rolling  Once       Question Answer Comment  Walker: With [place]   Patient needs a walker to treat with the following condition Weakness      07/14/21 1229

## 2021-07-14 NOTE — Evaluation (Signed)
Physical Therapy Evaluation Patient Details Name: Lee Kim MRN: [id number] DOB: Apr 30, 1946 Today's Date: 07/14/2021  History of Present Illness  Pt. is 75 yr old M admitted on 10/9 due to generalized weakness, tachycardia, and SOB resulting in fall. CT head (-) for any acute abnormality, chest x-ray (-).  Currently on enteric precautions. 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  Clinical Impression  Pt. Was previously independent for functional mobility prior to admission, but has been experiencing fluctuating weakness due to chemo treatments.  Pt. Is currently requiring min A for transfers and to amb short distances with use of RW.  Pt. Demos dec balance, gait deviations, LE weakness and would benefit from skilled PT in acute care to address deficits indicated. Pt. Would ultimately like to return home on D/C, and will need to be able to safely negotiate flight of stairs or arrange for alternate set up downstairs.       Recommendations for follow up therapy are one component of a multi-disciplinary discharge planning process, led by the attending physician.  Recommendations may be updated based on patient status, additional functional criteria and insurance authorization.  Follow Up Recommendations Home health PT (Pt/spouse preference is for home with [facility] PT.  Will need bed downstairs or be able to negotiate full flight of steps in order to make transition possible.  Could also consider medical transport upstairs.)    Equipment Recommendations  Rolling walker with 5" wheels;Other (comment) [facility] bed - pt. would benefit from bed that [facility] elevates, has handrails, to improve independence and safety with bed mobility.)    Recommendations for Other Services       Precautions / Restrictions Precautions Precautions: None      Mobility  Bed Mobility Overal bed mobility: Modified Independent             General bed  mobility comments: Mod I with use of bed rail; takes inc time to complete transition. Patient Response: Flat affect  Transfers Overall transfer level: Needs assistance   Transfers: Sit to/from Stand Sit to Stand: Min assist         General transfer comment: Able to complete transfer from elevated bed height with min A and use of gait belt.  VCs for safety with hand placement.  Poor eccentric control when transferring to chair.  Ambulation/Gait Ambulation/Gait assistance: Min assist Gait Distance (Feet): 10 Feet Assistive device: Rolling walker (2 wheeled) Gait Pattern/deviations: Decreased step length - right;Decreased step length - left     General Gait Details: Pt. amb ~5 ft away from bed before stating he doesn't think he can walk any further.  Takes backwards steps towards chair and demos poor balance with inc postural sway with backward steps, req'ing inc support to prevent LOB/fall.  Stairs            Wheelchair Mobility    Modified Rankin (Stroke Patients Only)       Balance Overall balance assessment: Needs assistance           Standing balance-Leahy Scale: Poor                               Pertinent Vitals/Pain Pain Assessment: 0-10 Pain Score: 0-No pain Pain Descriptors / Indicators: Numbness (Reports B foot numbness)    Home Living Family/patient expects to be discharged to:: Private residence Living Arrangements: Spouse/significant other Available Help at Discharge: Family Type of  Home: House       Home Layout: Two level;Bed/bath upstairs (Discussed with family, they are considering moving a bed to the 1st floor.  Do not currently have one on main level.) Home Equipment: Cane - single point Additional Comments: Reports having a cane but states that he does not typically use it.    Prior Function Level of Independence: Independent         Comments: Reports being previously independent with ADLs and gait.     Hand  Dominance        Extremity/Trunk Assessment   Upper Extremity Assessment Upper Extremity Assessment: Defer to OT evaluation    Lower Extremity Assessment Lower Extremity Assessment: Generalized weakness       Communication   Communication: No difficulties  Cognition Arousal/Alertness: Awake/alert Behavior During Therapy: Agitated;Flat affect Overall Cognitive Status: Within Functional Limits for tasks assessed                                 General Comments: Pt. is indifferent to therapy recommendations; often states "I don't care".      General Comments General comments (skin integrity, edema, etc.): Pt. immediately wants to return BTB after getting to chair.  PT educates pt. on importance of sitting up to prevent PNA, blood clots, etc. and encourages pt. to sit up in chair for at least 1 hr.  NSG aware.    Exercises     Assessment/Plan    PT Assessment Patient needs continued PT services  PT Problem List Decreased strength;Decreased mobility;Decreased activity tolerance;Cardiopulmonary status limiting activity;Decreased balance;Impaired sensation       PT Treatment Interventions DME instruction;Therapeutic exercise;Gait training;Balance training;Stair training;Functional mobility training;Therapeutic activities;Patient/family education    PT Goals (Current goals can be found in the Care Plan section)  Acute Rehab PT Goals Patient Stated Goal: Pt's goal is to return home PT Goal Formulation: With patient/family Time For Goal Achievement: 07/28/21 Potential to Achieve Goals: Good    Frequency Min 3X/week   Barriers to discharge Inaccessible home environment Pt. has difficulty with stair climbing and has flight of stairs to reach bedroom on 2nd floor.    Co-evaluation               AM-PAC PT "6 Clicks" Mobility  Outcome Measure Help needed turning from your back to your side while in a flat bed without using bedrails?: None Help needed  moving from lying on your back to sitting on the side of a flat bed without using bedrails?: A Little Help needed moving to and from a bed to a chair (including a wheelchair)?: A Little Help needed standing up from a chair using your arms (e.g., wheelchair or bedside chair)?: A Little Help needed to walk in hospital room?: A Little Help needed climbing 3-5 steps with a railing? : A Lot 6 Click Score: 18    End of Session Equipment Utilized During Treatment: Gait belt Activity Tolerance: Patient limited by fatigue Patient left: in chair;with bed alarm set;with call bell/phone within reach;with family/visitor present;with nursing/sitter in room Nurse Communication: Mobility status PT Visit Diagnosis: Unsteadiness on feet (R26.81);History of falling (Z91.81);Muscle weakness (generalized) (M62.81)    Time: 8527-7824 PT Time Calculation (min) (ACUTE ONLY): 29 min   Charges:   PT Evaluation $PT Eval Low Complexity: 1 Low PT Treatments $Therapeutic Activity: 8-22 mins        Yamilka Lopiccolo A. Libi Corso, PT, DPT Acute Rehabilitation Services Office:  [place] 07/14/2021, 10:08 AM

## 2021-07-14 NOTE — Progress Notes (Signed)
  Progress Note    Lee Kim   PTW:[id number]  DOB: 1946/08/12  DOA: 07/12/2021     2 Date of Service: 07/14/2021   Hospital course. 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 C. difficile is pending.   Subjective:  Diarrhea improving.  No nausea no vomiting.  No abdominal pain.  No blood in the stool.  Hospital Problems Sepsis present on admission UTI secondary to staph hemolyticus -Evidenced by new onset of fever, elevated lactate, endorgan damage of new AKI, suspected infection source is GI tract. -Initially was on IV vancomycin, later on cefepime, received 1 dose of Flagyl in the ED. Now on Zyvox. GI pathogen panel as well as C. difficile currently pending. Blood cultures negative so far. Currently improving.   AKI Nontraumatic rhabdomyolysis -Secondary to sepsis and GI loss. -IV bolus and maintenance IV fluid -Renal ultrasound negative for any obstruction. Rhabdomyolysis etiology not clear.  Currently improving.  We will hold hydrochlorothiazide.   HTN -Hold all home BP meds -PRN hydralazine   Chronic A. Fib -Rate controlled, continue Eliquis.  Dose adjusted based on renal function.  Fall/generalized weakness/deconditioning -PT evaluation.   Metastatic pancreatic cancer Appreciate oncology input.  Wife and questions regarding administration and chemotherapy. Informed that the patient can safely get COVID and influenza Vaccination.  BPH. On Flomax.  Required intermittent catheterization. Now has a Foley catheter placed on night of 10/10.  Attempt voiding trial tomorrow.    Objective Vital signs were reviewed and unremarkable.  Vitals:   07/13/21 2046 07/14/21 0500 07/14/21 0744 07/14/21 1658  BP: 113/75 122/79 (!) 125/92 (!) 134/94  Pulse: 84 76 80 88  Resp:  18 17 17   Temp: 98 F (36.7 C) 97.6 F (36.4 C) 98.1 F (36.7 C) 97.8  F (36.6 C)  TempSrc: Oral Axillary Oral Oral  SpO2: 97% 100% 99% 100%  Weight:      Height:       77.1 kg  Exam General: Appear in mild distress, no Rash; Oral Mucosa Clear, moist. no Abnormal Neck Mass Or lumps, Conjunctiva normal  Cardiovascular: S1 and S2 Present, no Murmur, Respiratory: good respiratory effort, Bilateral Air entry present and CTA, no Crackles, no wheezes Abdomen: Bowel Sound present, Soft and no tenderness Extremities: no Pedal edema Neurology: alert and oriented to time, place, and person affect appropriate. no new focal deficit Gait not checked due to patient safety concerns    Labs / Other Information Renal function improving.  Close to baseline.  LFTs chronically elevated.  CK improving to 1100.  Hemoglobin stable.    Time spent: 30 mins [facility] 07/14/2021, 7:31 PM

## 2021-07-14 NOTE — Progress Notes (Signed)
RT note. RT at bedside to see if patient wanted to wear cpap tonight. Patient had CPAP in hand and said he needed a break, patient stated he can place back on himself when ready to wear again. RT will continue to monitor.

## 2021-07-14 NOTE — TOC Initial Note (Addendum)
Transition of Care [facility]) - Initial/Assessment Note    Patient Details  Name: Lee Kim MRN: [id number] Date of Birth: [birth year]  Transition of [facility]) CM/SW Contact:    Marilu Favre, RN Phone Number: 07/14/2021, 12:49 PM  Clinical Narrative:                 Nino Glow to patient at bedside regarding PT recommendations, HHPT, supervision, hospital bed and walker. Patient consented for NCM to call his wife. NCM called [id number] [phone] and left voicemail. Await call back.   [place] called back , discussed above, she is in agreement. Confirmed address. Ordered hospital bed and walker with Freda Munro with [facility].   Cindie with [facility] accepted for HHPT.  Expected Discharge Plan: [place]     Patient Goals and CMS Choice Patient states their goals for this hospitalization and ongoing recovery are:: to go home CMS Medicare.gov Compare Post Acute Care list provided to:: Patient Choice offered to / list presented to : Patient  Expected Discharge Plan and Services Expected Discharge Plan: [place]   Discharge Planning Services: CM Consult Post Acute Care Choice: Home Health, Durable Medical Equipment Living arrangements for the past 2 months: Single Family Home                           HH Arranged: PT          Prior Living Arrangements/Services Living arrangements for the past 2 months: Single Family Home Lives with:: Spouse Patient language and need for interpreter reviewed:: Yes Do you feel safe going back to the place where you live?: Yes      Need for Family Participation in Patient Care: Yes (Comment) Care giver support system in place?: Yes (comment)   Criminal Activity/Legal Involvement Pertinent to Current Situation/Hospitalization: No - Comment as needed  Activities of Daily Living Home Assistive Devices/Equipment: Cane (specify quad or straight) ADL Screening (condition at time of  admission) Patient's cognitive ability adequate to safely complete daily activities?: Yes Is the patient deaf or have difficulty hearing?: No Does the patient have difficulty seeing, even when wearing glasses/contacts?: No Does the patient have difficulty concentrating, remembering, or making decisions?: No Patient able to express need for assistance with ADLs?: Yes Does the patient have difficulty dressing or bathing?: No Independently performs ADLs?: Yes (appropriate for developmental age) Does the patient have difficulty walking or climbing stairs?: Yes Weakness of Legs: Both Weakness of Arms/Hands: None  Permission Sought/Granted   Permission granted to share information with : Yes, Verbal Permission Granted  Share Information with NAME: spouse Maryellen 54 [phone]           Emotional Assessment Appearance:: Appears stated age Attitude/Demeanor/Rapport: Engaged Affect (typically observed): Accepting Orientation: : Oriented to Self, Oriented to Place, Oriented to  Time, Oriented to Situation Alcohol / Substance Use: Not Applicable Psych Involvement: No (comment)  Admission diagnosis:  AKI (acute kidney injury) ([place]) [N17.9] Patient Active Problem List   Diagnosis Date Noted   AKI (acute kidney injury) ([place]) 07/12/2021   Sepsis ([place]) 07/12/2021   Port-A-Cath in place 05/28/2021   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 ([place]) 05/07/2021   Homonymous bilateral field defects 04/30/2021   Atherosclerosis of coronary artery of native heart without angina pectoris 04/08/2021   Mediastinal adenopathy 04/08/2021   Intermediate stage nonexudative age-related macular degeneration of both eyes 12/31/2020   Branch  retinal vein occlusion, right eye 11/19/2020   Branch retinal vein occlusion, left eye 11/19/2020   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   History of retinal detachment 11/19/2020   Erectile dysfunction 01/23/2019   Multinodular goiter  01/23/2019   Atherosclerosis of aorta ([place]) 01/23/2019   Ascending aortic aneurysm 01/23/2019   Glucose intolerance (impaired glucose tolerance) 08/24/2016   Chronic allergic rhinitis 08/11/2016   Alcohol use disorder, mild, abuse 08/11/2016   History of colonic polyps 08/11/2016   Unifocal PVCs 08/11/2016   Atrial fibrillation ([place]) 08/11/2016   OSA on CPAP 04/27/2010   Obesity 04/24/2010   ERYTHROCYTOSIS 04/24/2010   Essential hypertension 04/24/2010   PCP:  [name], MD Pharmacy:   [facility] [place], [address] [address] Phone: [phone] Fax: [phone]     Social Determinants of Health (SDOH) Interventions    Readmission Risk Interventions No flowsheet data found.

## 2021-07-14 NOTE — Evaluation (Signed)
Occupational Therapy Evaluation Patient Details Name: Lee Kim MRN: [id number] DOB: 1946-01-24 Today's Date: 07/14/2021   History of Present Illness Pt. is 75 yr old M admitted on 10/9 due to generalized weakness, tachycardia, and SOB resulting in fall. CT head (-) for any acute abnormality, chest x-ray (-).  Currently on enteric precautions. 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   Clinical Impression   Pt presents with decreased balance, mobility, strength, coordination, and activity tolerance. Pt currently requiring Min A with LB dressing/bathing, toileting, and functional transfers/mobility. Pt reports that he has been independent at baseline, however has had a recent decline after chemo treatments. Pt lives with spouse with bedroom on second floor, however hospital bed has been ordered per CM note which will allow pt to stay on ground floor. Pt would likely benefit from [facility] services for home safety recommendations and AE training upon d/c. Will continue to follow acutely.     Recommendations for follow up therapy are one component of a multi-disciplinary discharge planning process, led by the attending physician.  Recommendations may be updated based on patient status, additional functional criteria and insurance authorization.   Follow Up Recommendations  Home health OT    Equipment Recommendations  3 in 1 bedside commode    Recommendations for Other Services       Precautions / Restrictions Precautions Precautions: Fall Restrictions Weight Bearing Restrictions: No      Mobility Bed Mobility Overal bed mobility: Modified Independent             General bed mobility comments: Mod I using bed rails for supine<>sit    Transfers                      Balance Overall balance assessment: Needs assistance Sitting-balance support: Feet supported Sitting balance-Leahy Scale: Fair     Standing  balance support: During functional activity Standing balance-Leahy Scale: Poor                             ADL either performed or assessed with clinical judgement   ADL Overall ADL's : Needs assistance/impaired Eating/Feeding: Independent   Grooming: Independent;Sitting   Upper Body Bathing: Independent;Sitting   Lower Body Bathing: Minimal assistance;Sitting/lateral leans;Sit to/from stand   Upper Body Dressing : Independent;Sitting   Lower Body Dressing: Minimal assistance;Sitting/lateral leans;Sit to/from stand   Toilet Transfer: Minimal assistance;Ambulation;BSC;RW   Toileting- Clothing Manipulation and Hygiene: Minimal assistance;Sitting/lateral lean;Sit to/from stand   Tub/ Shower Transfer: Minimal assistance;Shower seat;Rolling walker;Tub transfer   Functional mobility during ADLs: Minimal assistance;Rolling walker General ADL Comments: Limited by balance, strength, and activity tolerance.     Vision   Vision Assessment?: No apparent visual deficits     Perception     Praxis      Pertinent Vitals/Pain Pain Assessment: No/denies pain     Hand Dominance     Extremity/Trunk Assessment Upper Extremity Assessment Upper Extremity Assessment: Generalized weakness;RUE deficits/detail;LUE deficits/detail RUE Coordination: decreased fine motor LUE Coordination: decreased fine motor   Lower Extremity Assessment Lower Extremity Assessment: Defer to PT evaluation       Communication Communication Communication: No difficulties   Cognition Arousal/Alertness: Awake/alert Behavior During Therapy: Flat affect Overall Cognitive Status: Within Functional Limits for tasks assessed  General Comments  Pt. immediately wants to return BTB after getting to chair.  PT educates pt. on importance of sitting up to prevent PNA, blood clots, etc. and encourages pt. to sit up in chair for at least 1 hr.  NSG  aware.    Exercises     Shoulder Instructions      Home Living Family/patient expects to be discharged to:: Private residence Living Arrangements: Spouse/significant other Available Help at Discharge: Family Type of Home: House       Home Layout: Two level;Bed/bath upstairs Alternate Level Stairs-Number of Steps: 10 steps, landing, then 4 more steps.  States he tends to get winded when climbing stairs, wants to ask the doctor if he can have O2 tank for stairs. Alternate Level Stairs-Rails: Can reach both Bathroom Shower/Tub: Teacher, early years/pre: Standard     Home Equipment: Shower seat;Grab bars - tub/shower;Cane - single point          Prior Functioning/Environment Level of Independence: Independent                 OT Problem List: Decreased strength;Decreased activity tolerance;Impaired balance (sitting and/or standing);Decreased coordination;Decreased knowledge of use of DME or AE;Impaired sensation;Impaired UE functional use      OT Treatment/Interventions: Self-care/ADL training;Therapeutic exercise;Neuromuscular education;Energy conservation;DME and/or AE instruction;Therapeutic activities;Patient/family education;Balance training    OT Goals(Current goals can be found in the care plan section) Acute Rehab OT Goals Patient Stated Goal: Pt's goal is to return home OT Goal Formulation: With patient Time For Goal Achievement: 07/28/21 Potential to Achieve Goals: Good  OT Frequency: Min 2X/week   Barriers to D/C:            Co-evaluation              AM-PAC OT "6 Clicks" Daily Activity     Outcome Measure Help from another person eating meals?: None Help from another person taking care of personal grooming?: None Help from another person toileting, which includes using toliet, bedpan, or urinal?: A Little Help from another person bathing (including washing, rinsing, drying)?: A Little Help from another person to put on and taking off  regular upper body clothing?: None Help from another person to put on and taking off regular lower body clothing?: A Little 6 Click Score: 21   End of Session Equipment Utilized During Treatment: Rolling walker;Gait belt Nurse Communication: Mobility status  Activity Tolerance: Patient limited by fatigue Patient left: in bed;with call bell/phone within reach  OT Visit Diagnosis: Unsteadiness on feet (R26.81);Other abnormalities of gait and mobility (R26.89);Muscle weakness (generalized) (M62.81)                Time: 1329-1350 OT Time Calculation (min): 21 min Charges:  OT General Charges $OT Visit: 1 Visit OT Evaluation $OT Eval Moderate Complexity: 1 Mod  Tom Ragsdale C, OT/L  Acute Rehab [place] 07/14/2021, 2:01 PM

## 2021-07-15 ENCOUNTER — Inpatient Hospital Stay: Payer: PPO

## 2021-07-15 ENCOUNTER — Inpatient Hospital Stay: Payer: PPO | Attending: Hematology and Oncology

## 2021-07-15 ENCOUNTER — Inpatient Hospital Stay: Payer: PPO | Admitting: Hematology and Oncology

## 2021-07-15 DIAGNOSIS — R0602 Shortness of breath: Secondary | ICD-10-CM | POA: Insufficient documentation

## 2021-07-15 DIAGNOSIS — T451X5D Adverse effect of antineoplastic and immunosuppressive drugs, subsequent encounter: Secondary | ICD-10-CM | POA: Insufficient documentation

## 2021-07-15 DIAGNOSIS — E669 Obesity, unspecified: Secondary | ICD-10-CM | POA: Insufficient documentation

## 2021-07-15 DIAGNOSIS — Z9989 Dependence on other enabling machines and devices: Secondary | ICD-10-CM

## 2021-07-15 DIAGNOSIS — R748 Abnormal levels of other serum enzymes: Secondary | ICD-10-CM

## 2021-07-15 DIAGNOSIS — R6520 Severe sepsis without septic shock: Secondary | ICD-10-CM

## 2021-07-15 DIAGNOSIS — G4733 Obstructive sleep apnea (adult) (pediatric): Secondary | ICD-10-CM

## 2021-07-15 DIAGNOSIS — R410 Disorientation, unspecified: Secondary | ICD-10-CM

## 2021-07-15 DIAGNOSIS — N390 Urinary tract infection, site not specified: Secondary | ICD-10-CM

## 2021-07-15 DIAGNOSIS — R110 Nausea: Secondary | ICD-10-CM | POA: Insufficient documentation

## 2021-07-15 DIAGNOSIS — I10 Essential (primary) hypertension: Secondary | ICD-10-CM | POA: Insufficient documentation

## 2021-07-15 DIAGNOSIS — R197 Diarrhea, unspecified: Secondary | ICD-10-CM | POA: Insufficient documentation

## 2021-07-15 DIAGNOSIS — R5381 Other malaise: Secondary | ICD-10-CM

## 2021-07-15 DIAGNOSIS — R5383 Other fatigue: Secondary | ICD-10-CM | POA: Insufficient documentation

## 2021-07-15 DIAGNOSIS — C259 Malignant neoplasm of pancreas, unspecified: Secondary | ICD-10-CM | POA: Insufficient documentation

## 2021-07-15 DIAGNOSIS — F17210 Nicotine dependence, cigarettes, uncomplicated: Secondary | ICD-10-CM | POA: Insufficient documentation

## 2021-07-15 DIAGNOSIS — T796XXA Traumatic ischemia of muscle, initial encounter: Secondary | ICD-10-CM

## 2021-07-15 DIAGNOSIS — D539 Nutritional anemia, unspecified: Secondary | ICD-10-CM

## 2021-07-15 DIAGNOSIS — D6481 Anemia due to antineoplastic chemotherapy: Secondary | ICD-10-CM | POA: Insufficient documentation

## 2021-07-15 DIAGNOSIS — I4819 Other persistent atrial fibrillation: Secondary | ICD-10-CM

## 2021-07-15 DIAGNOSIS — C772 Secondary and unspecified malignant neoplasm of intra-abdominal lymph nodes: Secondary | ICD-10-CM | POA: Insufficient documentation

## 2021-07-15 LAB — GASTROINTESTINAL PANEL BY PCR, STOOL (REPLACES STOOL CULTURE)

## 2021-07-15 LAB — URINE CULTURE: Culture: >=100000 — AB

## 2021-07-15 LAB — BASIC METABOLIC PANEL
Anion gap: 8 (ref 5–15)
BUN: 13 mg/dL (ref 8–23)
CO2: 20 mmol/L — ABNORMAL LOW (ref 22–32)
Calcium: 8.1 mg/dL — ABNORMAL LOW (ref 8.9–10.3)
Chloride: 112 mmol/L — ABNORMAL HIGH (ref 98–111)
Creatinine, Ser: 1.35 mg/dL — ABNORMAL HIGH (ref 0.61–1.24)
GFR, Estimated: 55 mL/min — ABNORMAL LOW (ref >60)
Glucose, Bld: 91 mg/dL (ref 70–99)
Potassium: 3.9 mmol/L (ref 3.5–5.1)
Sodium: 140 mmol/L (ref 135–145)

## 2021-07-15 LAB — CBC WITH DIFFERENTIAL/PLATELET
Abs Immature Granulocytes: 0.2 10*3/uL — ABNORMAL HIGH (ref 0.00–0.07)
Basophils Absolute: 0.1 10*3/uL (ref 0.0–0.1)
Basophils Relative: 2 %
Eosinophils Absolute: 0.1 10*3/uL (ref 0.0–0.5)
Eosinophils Relative: 2 %
HCT: 25 % — ABNORMAL LOW (ref 39.0–52.0)
Hemoglobin: 8 g/dL — ABNORMAL LOW (ref 13.0–17.0)
Lymphocytes Relative: 4 %
Lymphs Abs: 0.2 10*3/uL — ABNORMAL LOW (ref 0.7–4.0)
MCH: 34.6 pg — ABNORMAL HIGH (ref 26.0–34.0)
MCHC: 32 g/dL (ref 30.0–36.0)
MCV: 108.2 fL — ABNORMAL HIGH (ref 80.0–100.0)
Metamyelocytes Relative: 2 %
Monocytes Absolute: 1.2 10*3/uL — ABNORMAL HIGH (ref 0.1–1.0)
Monocytes Relative: 26 %
Myelocytes: 1 %
Neutro Abs: 3 10*3/uL (ref 1.7–7.7)
Neutrophils Relative %: 62 %
Platelets: 217 10*3/uL (ref 150–400)
Promyelocytes Relative: 1 %
RBC: 2.31 MIL/uL — ABNORMAL LOW (ref 4.22–5.81)
RDW: 23.2 % — ABNORMAL HIGH (ref 11.5–15.5)
WBC: 4.8 10*3/uL (ref 4.0–10.5)
nRBC: 2 /100 WBC — ABNORMAL HIGH
nRBC: 2.1 % — ABNORMAL HIGH (ref 0.0–0.2)

## 2021-07-15 LAB — CK: Total CK: 911 U/L — ABNORMAL HIGH (ref 49–397)

## 2021-07-15 MED ORDER — CEFADROXIL 500 MG PO CAPS
1000.0000 mg | ORAL_CAPSULE | Freq: Two times a day (BID) | ORAL | Status: DC
  Administered 2021-07-15 – 2021-07-17 (×4): 1000 mg via ORAL
  Filled 2021-07-15 (×5): qty 2

## 2021-07-15 MED ORDER — APIXABAN 5 MG PO TABS
5.0000 mg | ORAL_TABLET | Freq: Two times a day (BID) | ORAL | Status: DC
  Administered 2021-07-15 – 2021-07-17 (×4): 5 mg via ORAL
  Filled 2021-07-15 (×4): qty 1

## 2021-07-15 MED ORDER — MELATONIN 3 MG PO TABS
6.0000 mg | ORAL_TABLET | Freq: Every day | ORAL | Status: DC
  Administered 2021-07-15 – 2021-07-16 (×2): 6 mg via ORAL
  Filled 2021-07-15 (×2): qty 2

## 2021-07-15 MED ORDER — ENSURE ENLIVE PO LIQD
237.0000 mL | Freq: Two times a day (BID) | ORAL | Status: DC
  Administered 2021-07-16: 237 mL via ORAL

## 2021-07-15 MED ORDER — ADULT MULTIVITAMIN W/MINERALS CH
1.0000 | ORAL_TABLET | Freq: Every day | ORAL | Status: DC
  Administered 2021-07-16: 1 via ORAL
  Filled 2021-07-15 (×2): qty 1

## 2021-07-15 NOTE — Progress Notes (Signed)
PROGRESS NOTE  Lee DIIORIO Kim:[id number] DOB: Dec 28, 1945   PCP: [name], MD  Patient is from: Home  DOA: 07/12/2021 LOS: 3  Chief complaints:  Chief Complaint  Patient presents with   Weakness   Fall     Brief Narrative / Interim history: 75 year old M with PMH of metastatic pancreatic cancer on chemo, paroxysmal A. fib on Eliquis, OSA on CPAP, HTN and BPH presenting after syncopal episode and diarrhea, and admitted for AKI, sepsis due to UTI and rhabdomyolysis.  GIP and C. difficile negative.  Subjective: Seen and examined earlier this morning.  Patient was confused overnight of this morning.  He is asking why his wife was not here last night. No complaints this morning other than dyspnea on exertion and weakness.  He denies chest pain, nausea or vomiting.  Diarrhea seems to have subsided.  Objective: Vitals:   07/14/21 1959 07/15/21 0238 07/15/21 0457 07/15/21 0753  BP: (!) 135/95  122/85 (!) 146/95  Pulse: 100 88 94 75  Resp: 17  17 17   Temp: (!) 97.2 F (36.2 C)  98.2 F (36.8 C) 97.8 F (36.6 C)  TempSrc: Oral     SpO2: 97% 100% 100% 100%  Weight:      Height:        Intake/Output Summary (Last 24 hours) at 07/15/2021 1511 Last data filed at 07/15/2021 1305 Gross per 24 hour  Intake 1754.9 ml  Output 1825 ml  Net -70.1 ml   Filed Weights   07/12/21 1137  Weight: 77.1 kg    Examination:  GENERAL: No apparent distress.  Nontoxic. HEENT: MMM.  Vision and hearing grossly intact.  NECK: Supple.  No apparent JVD.  RESP: 100% on RA.  No IWOB.  Fair aeration bilaterally. CVS:  RRR. Heart sounds normal.  ABD/GI/GU: BS+. Abd soft, NTND.  MSK/EXT:  Moves extremities. No apparent deformity. No edema.  SKIN: Scabbed wound below his left knee.  No signs of infection NEURO: Awake.  Oriented to self, place, person but not time.  Follows commands.  No apparent focal neuro deficit. PSYCH: Calm. Normal affect.   Procedures:  None  Microbiology  summarized:  Assessment & Plan: Severe sepsis due to UTI-urine culture with staph hemolyticus-had fever, tachycardia, tachypnea, elevated LA and AKI.  Sepsis physiology resolving. -Antibiotic de-escalated to cefadroxil to complete treatment course  Diarrhea-pancreatic insufficiency or chemo?  C. difficile and GIP negative. -Imodium as needed -Continue IV fluid   AKI: Likely combination of prerenal from GI loss and ATN from rhabdomyolysis. Recent Labs    05/26/21 0750 05/28/21 1057 06/04/21 1104 06/12/21 0931 06/25/21 0807 07/02/21 0808 07/12/21 1139 07/13/21 0043 07/14/21 1033 07/15/21 0042  BUN 66* 69* 32* 29* 18 15 19 23 20 13   CREATININE 2.30* 1.93* 1.61* 1.59* 1.45* 1.14 2.27* 2.01* 1.70* 1.35*  -Continue IV fluid  Rhabdomyolysis-unclear etiology but could be traumatic or from sepsis.  Improving. -IV fluid as above. -Recheck CK in the morning -Continue holding HCTZ  Chronic systolic CHF: TTE in 3762 with LVEF of 45 to 50%, diffuse hypokinesis.  BNP elevated to 200s but appears dehydrated on presentation.  Does not seem to be on diuretics at home. -Monitor fluid status while on IV fluid  Elevated LFT-likely from rhabdo.  Improved. -Manage rhabdo as above -Recheck in the morning  Confusion/delirium-oriented x4 except date and month but asking why his wife wasn't there last night.  -Reorientation and delirium precautions. -Avoid or minimize sedating medications  Macrocytic anemia: H&H relatively stable.  B12 high.  Folic acid normal. Recent Labs    05/28/21 1057 06/04/21 1104 06/12/21 0931 06/25/21 0807 07/02/21 0808 07/12/21 1139 07/13/21 0043 07/13/21 1510 07/14/21 1033 07/15/21 0042  HGB 8.7* 10.1* 8.8* 8.8* 8.6* 8.4* 7.8* 7.8* 8.8* 8.0*  -Monitor  Essential hypertension: BP within acceptable range. -As needed hydralazine   Chronic A. Fib -Continue Eliquis-increase dose to 5 mg twice daily   Fall/generalized weakness/deconditioning -PT/OT    Metastatic pancreatic cancer-on chemo prior to admission -Appreciate oncology input-chemo on hold   BPH with urinary retention: Does intermittent catheter at home. -Indwelling Foley catheter placed on 10/20 -Voiding trial in the morning   Body mass index is 22.43 kg/m.         DVT prophylaxis:  Place and maintain sequential compression device Start: 07/14/21 1123 Place TED hose Start: 07/14/21 1123 Place and maintain sequential compression device Start: 07/13/21 0737 apixaban (ELIQUIS) tablet 5 mg  Code Status: DNR/DNI  Family Communication: Updated patient's wife at the bedside. Level of care: MedSurg Status is: Inpatient  Remains inpatient appropriate because:Altered mental status, IV treatments appropriate due to intensity of illness or inability to take PO, and Inpatient level of care appropriate due to severity of illness  Dispo: The patient is from: Home              Anticipated d/c is to: Home              Patient currently is not medically stable to d/c.   Difficult to place patient No       Consultants:  Hematology/oncology   Sch Meds:  Scheduled Meds:  acidophilus  2 capsule Oral TID   apixaban  5 mg Oral BID   cefadroxil  1,000 mg Oral BID   Chlorhexidine Gluconate Cloth  6 each Topical Daily   feeding supplement  237 mL Oral BID BM   ferrous sulfate  325 mg Oral Q breakfast   melatonin  3 mg Oral QHS   multivitamin with minerals  1 tablet Oral Daily   tamsulosin  0.4 mg Oral BID PC   Continuous Infusions:  lactated ringers 125 mL/hr at 07/15/21 0027   PRN Meds:.acetaminophen **OR** acetaminophen, cromolyn, hydrALAZINE, metoprolol tartrate, ondansetron **OR** ondansetron (ZOFRAN) IV, zolpidem  Antimicrobials: Anti-infectives (From admission, onward)    Start     Dose/Rate Route Frequency Ordered Stop   07/15/21 2200  cefadroxil (DURICEF) capsule 1,000 mg        1,000 mg Oral 2 times daily 07/15/21 0844 07/19/21 0959   07/14/21 2200   linezolid (ZYVOX) tablet 600 mg  Status:  Discontinued        600 mg Oral Every 12 hours 07/14/21 1906 07/15/21 0844   07/13/21 0200  ceFEPIme (MAXIPIME) 2 g in sodium chloride 0.9 % 100 mL IVPB  Status:  Discontinued        2 g 200 mL/hr over 30 Minutes Intravenous Every 12 hours 07/12/21 1339 07/14/21 1601   07/12/21 1339  vancomycin variable dose per unstable renal function (pharmacist dosing)  Status:  Discontinued         Does not apply See admin instructions 07/12/21 1339 07/12/21 1616   07/12/21 1200  ceFEPIme (MAXIPIME) 2 g in sodium chloride 0.9 % 100 mL IVPB        2 g 200 mL/hr over 30 Minutes Intravenous  Once 07/12/21 1150 07/12/21 1340   07/12/21 1200  metroNIDAZOLE (FLAGYL) IVPB 500 mg        500 mg  100 mL/hr over 60 Minutes Intravenous  Once 07/12/21 1150 07/12/21 1410   07/12/21 1200  vancomycin (VANCOREADY) IVPB 1500 mg/300 mL        1,500 mg 150 mL/hr over 120 Minutes Intravenous  Once 07/12/21 1150 07/12/21 1838        I have personally reviewed the following labs and images: CBC: Recent Labs  Lab 07/12/21 1139 07/13/21 0043 07/13/21 1510 07/14/21 1033 07/15/21 0042  WBC 4.9 4.4 3.7* 4.6 4.8  NEUTROABS 3.0  --   --  2.0 3.0  HGB 8.4* 7.8* 7.8* 8.8* 8.0*  HCT 26.4* 23.6* 24.1* 27.3* 25.0*  MCV 110.9* 106.8* 108.6* 111.4* 108.2*  PLT 137* 154 179 224 217   BMP &GFR Recent Labs  Lab 07/12/21 1139 07/12/21 2015 07/13/21 0043 07/14/21 1033 07/15/21 0042  NA 141  --  140 139 140  K 3.4*  --  3.3* 3.5 3.9  CL 111  --  111 109 112*  CO2 16*  --  21* 20* 20*  GLUCOSE 138*  --  99 123* 91  BUN 19  --  23 20 13   CREATININE 2.27*  --  2.01* 1.70* 1.35*  CALCIUM 8.1*  --  7.8* 8.2* 8.1*  MG  --  1.8  --  1.7  --   PHOS  --  3.9  --   --   --    Estimated Creatinine Clearance: 53.1 mL/min (A) (by C-G formula based on SCr of 1.35 mg/dL (H)). Liver & Pancreas: Recent Labs  Lab 07/12/21 1139 07/13/21 0043 07/14/21 1033  AST 86* 105* 99*  ALT 73* 66*  64*  ALKPHOS 213* 166* 175*  BILITOT 1.0 1.1 0.6  PROT 5.5* 4.8* 5.3*  ALBUMIN 2.4* 2.2* 2.2*   No results for input(s): LIPASE, AMYLASE in the last 168 hours. No results for input(s): AMMONIA in the last 168 hours. Diabetic: No results for input(s): HGBA1C in the last 72 hours. Recent Labs  Lab 07/12/21 2014 07/13/21 0822  GLUCAP 124* 96   Cardiac Enzymes: Recent Labs  Lab 07/12/21 2015 07/13/21 1510 07/14/21 1033 07/15/21 0042  CKTOTAL 2,582* 2,101* 1,209* 911*   No results for input(s): PROBNP in the last 8760 hours. Coagulation Profile: Recent Labs  Lab 07/12/21 1139  INR 1.8*   Thyroid Function Tests: No results for input(s): TSH, T4TOTAL, FREET4, T3FREE, THYROIDAB in the last 72 hours. Lipid Profile: No results for input(s): CHOL, HDL, LDLCALC, TRIG, CHOLHDL, LDLDIRECT in the last 72 hours. Anemia Panel: Recent Labs    07/12/21 2015  VITAMINB12 1,376*  FOLATE 14.2  FERRITIN 1,843*  TIBC 181*  IRON 22*  RETICCTPCT 6.9*   Urine analysis:    Component Value Date/Time   COLORURINE YELLOW 07/12/2021 1637   APPEARANCEUR HAZY (A) 07/12/2021 1637   LABSPEC 1.013 07/12/2021 1637   PHURINE 5.0 07/12/2021 1637   GLUCOSEU NEGATIVE 07/12/2021 1637   HGBUR LARGE (A) 07/12/2021 1637   BILIRUBINUR NEGATIVE 07/12/2021 1637   BILIRUBINUR n 03/05/2015 [place] 07/12/2021 1637   PROTEINUR 100 (A) 07/12/2021 1637   UROBILINOGEN negative 03/05/2015 0947   NITRITE NEGATIVE 07/12/2021 1637   LEUKOCYTESUR NEGATIVE 07/12/2021 1637   Sepsis Labs: Invalid input(s): PROCALCITONIN, [place]  Microbiology: Recent Results (from the past 240 hour(s))  Blood Culture (routine x 2)     Status: None (Preliminary result)   Collection Time: 07/12/21 12:08 PM   Specimen: BLOOD  Result Value Ref Range Status   Specimen Description BLOOD SITE  NOT SPECIFIED  Final   Special Requests   Final    BOTTLES DRAWN AEROBIC AND ANAEROBIC Blood Culture results may not  be optimal due to an inadequate volume of blood received in culture bottles   Culture   Final    NO GROWTH 3 DAYS Performed at [facility], [place] [address], [place], [place] [address]    Report Status PENDING  Incomplete  Blood Culture (routine x 2)     Status: None (Preliminary result)   Collection Time: 07/12/21 12:10 PM   Specimen: BLOOD  Result Value Ref Range Status   Specimen Description BLOOD SITE NOT SPECIFIED  Final   Special Requests   Final    BOTTLES DRAWN AEROBIC AND ANAEROBIC Blood Culture results may not be optimal due to an inadequate volume of blood received in culture bottles   Culture   Final    NO GROWTH 3 DAYS Performed at [facility], [place] [address] [place],  [address]    Report Status PENDING  Incomplete  Urine Culture     Status: Abnormal   Collection Time: 07/12/21  4:37 PM   Specimen: Urine, Clean Catch  Result Value Ref Range Status   Specimen Description URINE, CLEAN CATCH  Final   Special Requests   Final    Immunocompromised Performed at [facility], [place] [address], [place], [place] [address]    Culture >=100,000 COLONIES/mL STAPHYLOCOCCUS HAEMOLYTICUS (A)  Final   Report Status 07/15/2021 FINAL  Final   Organism ID, Bacteria STAPHYLOCOCCUS HAEMOLYTICUS (A)  Final      Susceptibility   Staphylococcus haemolyticus - MIC*    CIPROFLOXACIN <=0.5 SENSITIVE Sensitive     GENTAMICIN <=0.5 SENSITIVE Sensitive     NITROFURANTOIN <=16 SENSITIVE Sensitive     OXACILLIN <=0.25 SENSITIVE Sensitive     TETRACYCLINE <=1 SENSITIVE Sensitive     VANCOMYCIN 1 SENSITIVE Sensitive     TRIMETH/SULFA <=10 SENSITIVE Sensitive     CLINDAMYCIN <=0.25 SENSITIVE Sensitive     RIFAMPIN <=0.5 SENSITIVE Sensitive     Inducible Clindamycin NEGATIVE Sensitive     * >=100,000 COLONIES/mL STAPHYLOCOCCUS HAEMOLYTICUS  Resp Panel by RT-PCR (Flu A&B, Covid) Urine, Clean Catch     Status: None   Collection Time: 07/12/21  4:37 PM   Specimen: Urine,  Clean Catch; Nasopharyngeal(NP) swabs in vial transport medium  Result Value Ref Range Status   SARS Coronavirus 2 by RT PCR NEGATIVE NEGATIVE Final    Comment: (NOTE) SARS-CoV-2 target nucleic acids are NOT DETECTED.  The SARS-CoV-2 RNA is generally detectable in upper respiratory specimens during the acute phase of infection. The lowest concentration of SARS-CoV-2 viral copies this assay can detect is 138 copies/mL. A negative result does not preclude SARS-Cov-2 infection and should not be used as the sole basis for treatment or other patient management decisions. A negative result may occur with  improper specimen collection/handling, submission of specimen other than nasopharyngeal swab, presence of viral mutation(s) within the areas targeted by this assay, and inadequate number of viral copies(<138 copies/mL). A negative result must be combined with clinical observations, patient history, and epidemiological information. The expected result is Negative.  Fact Sheet for Patients:  EntrepreneurPulse.com.au  Fact Sheet for Healthcare Providers:  IncredibleEmployment.be  This test is no t yet approved or cleared by the [place] FDA and  has been authorized for detection and/or diagnosis of SARS-CoV-2 by FDA under an Emergency Use Authorization (EUA). This EUA will remain  in effect (meaning this test can be used) for the duration of the COVID-19 declaration under Section 564(b)(1) of the Act, 21 U.S.C.section 360bbb-3(b)(1), unless the authorization is terminated  or revoked sooner.       Influenza A by PCR NEGATIVE NEGATIVE Final   Influenza B by PCR NEGATIVE NEGATIVE Final    Comment: (NOTE) The Xpert Xpress SARS-CoV-2/FLU/RSV plus assay is intended as an aid in the diagnosis of influenza from Nasopharyngeal swab specimens and should not be used as a sole basis for treatment. Nasal washings and aspirates are unacceptable for Xpert  Xpress SARS-CoV-2/FLU/RSV testing.  Fact Sheet for Patients: EntrepreneurPulse.com.au  Fact Sheet for Healthcare Providers: IncredibleEmployment.be  This test is not yet approved or cleared by the [place] FDA and has been authorized for detection and/or diagnosis of SARS-CoV-2 by FDA under an Emergency Use Authorization (EUA). This EUA will remain in effect (meaning this test can be used) for the duration of the COVID-19 declaration under Section 564(b)(1) of the Act, 21 U.S.C. section 360bbb-3(b)(1), unless the authorization is terminated or revoked.  Performed at [facility], [place] [address], [place], [place] [address]   Gastrointestinal Panel by PCR , Stool     Status: None   Collection Time: 07/14/21 10:34 AM   Specimen: STOOL  Result Value Ref Range Status   Campylobacter species NOT DETECTED NOT DETECTED Final   Plesimonas shigelloides NOT DETECTED NOT DETECTED Final   Salmonella species NOT DETECTED NOT DETECTED Final   Yersinia enterocolitica NOT DETECTED NOT DETECTED Final   Vibrio species NOT DETECTED NOT DETECTED Final   Vibrio cholerae NOT DETECTED NOT DETECTED Final   Enteroaggregative E coli (EAEC) NOT DETECTED NOT DETECTED Final   Enteropathogenic E coli (EPEC) NOT DETECTED NOT DETECTED Final   Enterotoxigenic E coli (ETEC) NOT DETECTED NOT DETECTED Final   Shiga like toxin producing E coli (STEC) NOT DETECTED NOT DETECTED Final   Shigella/Enteroinvasive E coli (EIEC) NOT DETECTED NOT DETECTED Final   Cryptosporidium NOT DETECTED NOT DETECTED Final   Cyclospora cayetanensis NOT DETECTED NOT DETECTED Final   Entamoeba histolytica NOT DETECTED NOT DETECTED Final   Giardia lamblia NOT DETECTED NOT DETECTED Final   Adenovirus F40/41 NOT DETECTED NOT DETECTED Final   Astrovirus NOT DETECTED NOT DETECTED Final   Norovirus GI/GII NOT DETECTED NOT DETECTED Final   Rotavirus A NOT DETECTED NOT DETECTED Final   Sapovirus  (I, II, IV, and V) NOT DETECTED NOT DETECTED Final    Comment: Performed at [facility], [address] [place], [place] [address]  C Difficile Quick Screen w PCR reflex     Status: None   Collection Time: 07/14/21 10:34 AM   Specimen: STOOL  Result Value Ref Range Status   C Diff antigen NEGATIVE NEGATIVE Final   C Diff toxin NEGATIVE NEGATIVE Final   C Diff interpretation No C. difficile detected.  Final    Comment: Performed at [facility], [place] [address] [place], [place] [address]    Radiology Studies: No results found.    Skyleen Bentley T. [place]  If 7PM-7AM, please contact night-coverage [email or web address] 07/15/2021, 3:11 PM

## 2021-07-15 NOTE — Progress Notes (Signed)
Initial Nutrition Assessment  DOCUMENTATION CODES:   Non-severe (moderate) malnutrition in context of chronic illness  INTERVENTION:  Encourage PO intake  Ensure Enlive po BID, each supplement provides 350 kcal and 20 grams of protein  MVI with minerals daily  Requested updated weight   NUTRITION DIAGNOSIS:   Moderate Malnutrition related to chronic illness (metastatic pancreatic cancer) as evidenced by moderate muscle depletion, moderate fat depletion.   GOAL:   Patient will meet greater than or equal to 90% of their needs   MONITOR:   PO intake, Supplement acceptance, Weight trends  REASON FOR ASSESSMENT:   Consult Assessment of nutrition requirement/status  ASSESSMENT:   Admitted to hospital with worsening generalized weakness, diarrhea and fall. AKI secondary to sepsis of GI tract. PMH includes metastatic pancreatic cancer on chemo, HTN, PAF, and chronic macrocytic anemia.  10/5 completed 5th cycle of chemo. Pt being followed by outpatient RD during treatment.  Pt sitting in chair with wife at bedside who helped provide history. Noted confusion with pt's responses. He has had poor intake PTA and only eats 2 meals a day consisting of frozen meals and fast food. He reports having changes in taste since starting chemo treatments.  Pt has been drinking 1-2 Ensure daily.  Meal Completion: 0% x 1 meal (10/12) His wife states that he does not like to eat breakfast. Pt had not touched his lunch tray today  Per review of chart, pt's usual wt prior to chemo was 283 lb. Last recorded wt was 233 lb on 9/29 and admission wt of 170 lb. Suspect this was a stated wt and requested updated measured wt to ensure accuracy. Wt loss appeared to begin back in January 2022 per outpatient oncology RD.   Based on limited diet and weight history and current NFPE, pt meets clinical characteristics for moderate malnutrition.   Pt states he continues to experience diarrhea. Noted imodium was  given and d/c yesterday.   Medications: risaquad, duricef, ferrous sulfate, melatonin  Labs: Creatinine 1.35  NUTRITION - FOCUSED PHYSICAL EXAM:  Flowsheet Row Most Recent Value  Orbital Region Moderate depletion  Upper Arm Region No depletion  Thoracic and Lumbar Region Mild depletion  Buccal Region Moderate depletion  Temple Region Moderate depletion  Clavicle Bone Region Moderate depletion  Clavicle and Acromion Bone Region Mild depletion  Scapular Bone Region Mild depletion  Dorsal Hand Mild depletion  Patellar Region Mild depletion  Anterior Thigh Region Mild depletion  Posterior Calf Region Mild depletion  Edema (RD Assessment) Mild  Hair Reviewed  Eyes Reviewed  Mouth Reviewed  Skin Reviewed  Nails Reviewed       Diet Order:   Diet Order             Diet regular Room service appropriate? Yes; Fluid consistency: Thin  Diet effective now                   EDUCATION NEEDS:   No education needs have been identified at this time  Skin:  Skin Assessment: Reviewed RN Assessment (L knee abrasion)  Last BM:  07/14/21 diarrhea  Height:   Ht Readings from Last 1 Encounters:  07/12/21 6\' 1"  (1.854 m)    Weight:   Wt Readings from Last 1 Encounters:  07/12/21 77.1 kg    BMI:  Body mass index is 22.43 kg/m.  Estimated Nutritional Needs:   Kcal:  1191-4782  Protein:  115-130g  Fluid:  >2.3L  Clayborne Dana, RDN, LDN Clinical Nutrition

## 2021-07-15 NOTE — Plan of Care (Signed)
Pt able to answer orientation question but confused about time. Throughout the night, patient was fixated on picking up his wife or wanting to go see his wife. Pt kept asking if he can go check the parking lot at 0630 to see if his wife has arrived. Reoriented patient throughout the night. Pt declined PRN Ambien while wife was here and later with his night time meds. Initially refused all meds but upon further discussion agreed to take the schedule medications.   Pt had one small loose BM overnight, C-diff and GPP result negative.   Pt is anxious about drinking oral liquid due to fear of having the diarrhea.   Continuous LR infusion at 174ml/hr maintained.

## 2021-07-16 DIAGNOSIS — E440 Moderate protein-calorie malnutrition: Secondary | ICD-10-CM | POA: Insufficient documentation

## 2021-07-16 LAB — CK: Total CK: 563 U/L — ABNORMAL HIGH (ref 49–397)

## 2021-07-16 LAB — CBC WITH DIFFERENTIAL/PLATELET
Abs Immature Granulocytes: 0 10*3/uL (ref 0.00–0.07)
Basophils Absolute: 0 10*3/uL (ref 0.0–0.1)
Basophils Relative: 0 %
Eosinophils Absolute: 0.2 10*3/uL (ref 0.0–0.5)
Eosinophils Relative: 5 %
HCT: 25.7 % — ABNORMAL LOW (ref 39.0–52.0)
Hemoglobin: 8.3 g/dL — ABNORMAL LOW (ref 13.0–17.0)
Lymphocytes Relative: 9 %
Lymphs Abs: 0.4 10*3/uL — ABNORMAL LOW (ref 0.7–4.0)
MCH: 35.3 pg — ABNORMAL HIGH (ref 26.0–34.0)
MCHC: 32.3 g/dL (ref 30.0–36.0)
MCV: 109.4 fL — ABNORMAL HIGH (ref 80.0–100.0)
Monocytes Absolute: 1.1 10*3/uL — ABNORMAL HIGH (ref 0.1–1.0)
Monocytes Relative: 22 %
Neutro Abs: 3.1 10*3/uL (ref 1.7–7.7)
Neutrophils Relative %: 64 %
Platelets: 267 10*3/uL (ref 150–400)
RBC: 2.35 MIL/uL — ABNORMAL LOW (ref 4.22–5.81)
RDW: 22.7 % — ABNORMAL HIGH (ref 11.5–15.5)
WBC: 4.9 10*3/uL (ref 4.0–10.5)
nRBC: 1.2 % — ABNORMAL HIGH (ref 0.0–0.2)
nRBC: 3 /100 WBC — ABNORMAL HIGH

## 2021-07-16 LAB — COMPREHENSIVE METABOLIC PANEL
ALT: 51 U/L — ABNORMAL HIGH (ref 0–44)
AST: 67 U/L — ABNORMAL HIGH (ref 15–41)
Albumin: 2.1 g/dL — ABNORMAL LOW (ref 3.5–5.0)
Alkaline Phosphatase: 161 U/L — ABNORMAL HIGH (ref 38–126)
Anion gap: 8 (ref 5–15)
BUN: 10 mg/dL (ref 8–23)
CO2: 24 mmol/L (ref 22–32)
Calcium: 8.3 mg/dL — ABNORMAL LOW (ref 8.9–10.3)
Chloride: 108 mmol/L (ref 98–111)
Creatinine, Ser: 1.27 mg/dL — ABNORMAL HIGH (ref 0.61–1.24)
GFR, Estimated: 60 mL/min — ABNORMAL LOW (ref >60)
Glucose, Bld: 91 mg/dL (ref 70–99)
Potassium: 3.4 mmol/L — ABNORMAL LOW (ref 3.5–5.1)
Sodium: 140 mmol/L (ref 135–145)
Total Bilirubin: 0.9 mg/dL (ref 0.3–1.2)
Total Protein: 4.9 g/dL — ABNORMAL LOW (ref 6.5–8.1)

## 2021-07-16 LAB — MAGNESIUM: Magnesium: 1.5 mg/dL — ABNORMAL LOW (ref 1.7–2.4)

## 2021-07-16 LAB — PHOSPHORUS: Phosphorus: 2.8 mg/dL (ref 2.5–4.6)

## 2021-07-16 LAB — BRAIN NATRIURETIC PEPTIDE: B Natriuretic Peptide: 241.3 pg/mL — ABNORMAL HIGH (ref 0.0–100.0)

## 2021-07-16 MED ORDER — MAGNESIUM SULFATE 2 GM/50ML IV SOLN
2.0000 g | Freq: Once | INTRAVENOUS | Status: AC
  Administered 2021-07-16: 2 g via INTRAVENOUS
  Filled 2021-07-16: qty 50

## 2021-07-16 MED ORDER — POTASSIUM CHLORIDE CRYS ER 20 MEQ PO TBCR
40.0000 meq | EXTENDED_RELEASE_TABLET | ORAL | Status: AC
  Administered 2021-07-16 (×2): 40 meq via ORAL
  Filled 2021-07-16: qty 2
  Filled 2021-07-16: qty 4

## 2021-07-16 NOTE — Progress Notes (Signed)
Occupational Therapy Treatment Patient Details Name: Lee Kim MRN: [id number] DOB: 11-24-1945 Today's Date: 07/16/2021   History of present illness Pt. is 75 yr old M admitted on 10/9 due to generalized weakness, tachycardia, and SOB resulting in fall. CT head (-) for any acute abnormality, chest x-ray (-).  Currently on enteric precautions. 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   OT comments  Pt progressing towards acute OT goals. Focus of session was toileting goals. Pt needed min A to take pivotal steps to [facility] then to recliner. Multimodal cues for sequencing pivoting with rw. D/c plan remains appropriate.    Recommendations for follow up therapy are one component of a multi-disciplinary discharge planning process, led by the attending physician.  Recommendations may be updated based on patient status, additional functional criteria and insurance authorization.    Follow Up Recommendations  Home health OT ; 24 hour assistance/supervision   Equipment Recommendations  3 in 1 bedside commode    Recommendations for Other Services      Precautions / Restrictions Precautions Precautions: Fall Restrictions Weight Bearing Restrictions: No       Mobility Bed Mobility Overal bed mobility: Needs Assistance Bed Mobility: Supine to Sit     Supine to sit: Min assist     General bed mobility comments: Pt reaching for therapist arm to pull on to powerup trunk.    Transfers Overall transfer level: Needs assistance Equipment used: Rolling walker (2 wheeled) Transfers: Sit to/from [facility] Sit to Stand: Min assist Stand pivot transfers: Min assist       General transfer comment: EOB to BSC. min A to steady. cues for technique    Balance Overall balance assessment: Needs assistance Sitting-balance support: Feet supported Sitting balance-Leahy Scale: Fair     Standing balance support: During  functional activity Standing balance-Leahy Scale: Poor                             ADL either performed or assessed with clinical judgement   ADL Overall ADL's : Needs assistance/impaired                         Toilet Transfer: Minimal assistance;Ambulation;BSC;RW   Toileting- Clothing Manipulation and Hygiene: Minimal assistance;Sitting/lateral lean;Sit to/from stand       Functional mobility during ADLs: Minimal assistance;Rolling walker General ADL Comments: a bit impulsive + impaired judgement/safety awareness + impaired balance. Pt completed bed mobility then pivotal staps to access BSC then recliner. Pt needing multimodal cues for sequencing pivot transfers.     Vision       Perception     Praxis      Cognition Arousal/Alertness: Awake/alert Behavior During Therapy: Flat affect Overall Cognitive Status: No family/caregiver present to determine baseline cognitive functioning                                 General Comments: decreased awareness. confabulatory, tangential responses. cues for sequencing.        Exercises     Shoulder Instructions       General Comments      Pertinent Vitals/ Pain       Pain Assessment: No/denies pain  Home Living  Prior Functioning/Environment              Frequency  Min 2X/week        Progress Toward Goals  OT Goals(current goals can now be found in the care plan section)  Progress towards OT goals: Progressing toward goals  Acute Rehab OT Goals Patient Stated Goal: Pt's goal is to return home OT Goal Formulation: With patient Time For Goal Achievement: 07/28/21 Potential to Achieve Goals: Good ADL Goals Pt Will Perform Lower Body Dressing: with modified independence;sit to/from stand Pt Will Transfer to Toilet: with modified independence;bedside commode;ambulating Pt Will Perform Toileting - Clothing  Manipulation and hygiene: with modified independence;sitting/lateral leans Pt Will Perform Tub/Shower Transfer: with modified independence;3 in 1  Plan Discharge plan remains appropriate    Co-evaluation                 AM-PAC OT "6 Clicks" Daily Activity     Outcome Measure   Help from another person eating meals?: None Help from another person taking care of personal grooming?: None Help from another person toileting, which includes using toliet, bedpan, or urinal?: A Little Help from another person bathing (including washing, rinsing, drying)?: A Little Help from another person to put on and taking off regular upper body clothing?: None Help from another person to put on and taking off regular lower body clothing?: A Little 6 Click Score: 21    End of Session Equipment Utilized During Treatment: Rolling walker  OT Visit Diagnosis: Unsteadiness on feet (R26.81);Other abnormalities of gait and mobility (R26.89);Muscle weakness (generalized) (M62.81)   Activity Tolerance Patient limited by fatigue;Patient tolerated treatment well   Patient Left in chair;with call bell/phone within reach;with chair alarm set;with nursing/sitter in room   Nurse Communication          Time: 1228-1300 OT Time Calculation (min): 32 min  Charges: OT General Charges $OT Visit: 1 Visit OT Treatments $Self Care/Home Management : 23-37 mins  Tyrone Schimke, OT Acute Rehabilitation Services Pager: [phone] Office: [phone]   Hortencia Pilar 07/16/2021, 1:59 PM

## 2021-07-16 NOTE — Progress Notes (Signed)
Physical Therapy Treatment Patient Details Name: Lee Kim MRN: [id number] DOB: 16-Aug-1946 Today's Date: 07/16/2021   History of Present Illness Pt is 75 y.o. male admitted 07/12/21 with diarrhea, syncopal episode resulting in fall. Workup for AKI, sepsis secondary to UTI and rhabdomyolysis. Head CT negative for acute abnormality. 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   PT Comments    Pt progressing with mobility. Today's session focused on transfer and gait training with walker; pt requiring up to maxA to stand from lower surfaces, as well as intermittent minA to prevent LOB with short ambulation distances. Pt remains limited by generalized weakness, decreased activity tolerance, poor balance strategies/postural reactions and impaired cognition, including decreased safety awareness, poor attention and difficulty problem solving. Per chart, pt and wife with preference for return home. Attempted to call wife to coordinate time for her to observe/participate in session to ensure family can provide adequate assist. If family unable to provide necessary support, pt may require short-term SNF-level therapies to maximize functional mobility and independence prior to return home.     Recommendations for follow up therapy are one component of a multi-disciplinary discharge planning process, led by the attending physician.  Recommendations may be updated based on patient status, additional functional criteria and insurance authorization.  Follow Up Recommendations  Home health PT;Supervision for mobility/OOB (if family able to provide necessary support)     Equipment Recommendations  Rolling walker with 5" wheels;3in1 (PT);Kim bed    Recommendations for Other Services       Precautions / Restrictions Precautions Precautions: Fall Restrictions Weight Bearing Restrictions: No     Mobility  Bed Mobility Overal bed  mobility: Needs Assistance Bed Mobility: Supine to Sit;Sit to Supine     Supine to sit: Min assist;HOB elevated Sit to supine: Min assist   General bed mobility comments: MinA for trunk elevation and BLE management, use of bed rail    Transfers Overall transfer level: Needs assistance Equipment used: Rolling walker (2 wheeled) Transfers: Sit to/from Stand Sit to Stand: Max assist;Mod assist;Min assist Stand pivot transfers: Min assist       General transfer comment: Initial maxA for trunk elevation from low bed height to RW, pt doing little to assist with BUEs despite max verbal/tactile cuing; additional sit<>stands from toilet and recliner with min-modA for trunk elevation and stability; pt with heavy reliance on UE support  Ambulation/Gait Ambulation/Gait assistance: Min assist Gait Distance (Feet): 18 Feet (+ 34') Assistive device: Rolling walker (2 wheeled) Gait Pattern/deviations: Step-through pattern;Decreased stride length;Staggering right;Staggering left Gait velocity: Decreased   General Gait Details: Slow, unsteady gait with RW and intermittent minA to prevent multiple LOB; pt requiring max verbal cues for safety when walking to chair/bed/toilet to sit as he attempts to leave RW behind, poor carryover of education   Stairs             Wheelchair Mobility    Modified Rankin (Stroke Patients Only)       Balance Overall balance assessment: Needs assistance Sitting-balance support: Feet supported Sitting balance-Leahy Scale: Fair     Standing balance support: During functional activity;Bilateral upper extremity supported Standing balance-Leahy Scale: Poor Standing balance comment: Heavy reliance on UE support; requires assist for posterior pericare                            Cognition Arousal/Alertness: Awake/alert Behavior During Therapy: WFL for tasks assessed/performed;Anxious Overall Cognitive  Status: No family/caregiver present to  determine baseline cognitive functioning                                 General Comments: Pt seems anxious regarding mobility and fact wife not present, "I don't know where she is, I don't like when she sneaks off like that." Pt with poor awareness in deficits, requires frequent cues and redirection to task. Tangential responses requiring redirection      Exercises      General Comments General comments (skin integrity, edema, etc.): Pt adamantly declining sitting up in chair ("that chair is torture"). Feel pt would benefit from having wife/caregiver present during session to observe how much assist pt currently requiring - attempted call to wife Lee Kim) and voicemail left to coordinate time to be present during tomorrow's session. Pt with decreased insight into current functional mobility and assist needs      Pertinent Vitals/Pain Pain Assessment: Faces Faces Pain Scale: Hurts a little bit Pain Location: IV sites Pain Descriptors / Indicators: Discomfort;Grimacing Pain Intervention(s): Monitored during session    Home Living                      Prior Function            PT Goals (current goals can now be found in the care plan section) Acute Rehab PT Goals Patient Stated Goal: Pt's goal is to return home Progress towards PT goals: Progressing toward goals    Frequency    Min 3X/week      PT Plan Current plan remains appropriate    Co-evaluation              AM-PAC PT "6 Clicks" Mobility   Outcome Measure  Help needed turning from your back to your side while in a flat bed without using bedrails?: A Little Help needed moving from lying on your back to sitting on the side of a flat bed without using bedrails?: A Little Help needed moving to and from a bed to a chair (including a wheelchair)?: A Lot Help needed standing up from a chair using your arms (e.g., wheelchair or bedside chair)?: A Lot Help needed to walk in Kim room?:  A Little Help needed climbing 3-5 steps with a railing? : A Lot 6 Click Score: 15    End of Session Equipment Utilized During Treatment: Gait belt Activity Tolerance: Patient tolerated treatment well;Patient limited by fatigue Patient left: in bed;with call bell/phone within reach;with bed alarm set Nurse Communication: Mobility status       Time: 2330-0762 PT Time Calculation (min) (ACUTE ONLY): 29 min  Charges:  $Gait Training: 8-22 mins $Therapeutic Activity: 8-22 mins                     Mabeline Caras, PT, DPT Acute Rehabilitation Services  Pager [phone] Office [place] 07/16/2021, 3:43 PM

## 2021-07-16 NOTE — Progress Notes (Signed)
Pt foley d/ced per Dr Lina Sayre ie progression rounds

## 2021-07-16 NOTE — Progress Notes (Signed)
PROGRESS NOTE  Lee Kim ZOX:[id number] DOB: 03/31/1946   PCP: [name], MD  Patient is from: Home  DOA: 07/12/2021 LOS: 4  Chief complaints:  Chief Complaint  Patient presents with   Weakness   Fall     Brief Narrative / Interim history: 75 year old M with PMH of metastatic pancreatic cancer on chemo, paroxysmal A. fib on Eliquis, OSA on CPAP, HTN and BPH presenting after syncopal episode and diarrhea, and admitted for AKI, sepsis due to UTI and rhabdomyolysis.  GIP and C. difficile negative.  Remains on IV fluid for rhabdomyolysis  Subjective: Seen and examined earlier this morning.  No major events overnight of this morning.  Patient's wife spend the night here.  He reports 2 episodes of diarrhea although his wife has not witnessed this.  No other complaints.  He denies chest pain, dyspnea, abdominal pain, nausea or vomiting.  Objective: Vitals:   07/16/21 0411 07/16/21 0413 07/16/21 0935 07/16/21 1306  BP: 133/82 133/82 128/86 114/69  Pulse: 81 91 73 (!) 103  Resp:   18   Temp:  (!) 97.4 F (36.3 C) 97.6 F (36.4 C)   TempSrc:  Oral Oral   SpO2: 99% 99% 100% 100%  Weight:      Height:        Intake/Output Summary (Last 24 hours) at 07/16/2021 1449 Last data filed at 07/16/2021 1415 Gross per 24 hour  Intake 180 ml  Output 2250 ml  Net -2070 ml   Filed Weights   07/12/21 1137  Weight: 77.1 kg    Examination:  GENERAL: No apparent distress.  Nontoxic. HEENT: MMM.  Vision and hearing grossly intact.  NECK: Supple.  No apparent JVD.  RESP: 99% on CPAP.  No IWOB.  Fair aeration bilaterally. CVS:  RRR. Heart sounds normal.  ABD/GI/GU: BS+. Abd soft, NTND.  Indwelling Foley catheter. MSK/EXT:  Moves extremities. No apparent deformity. No edema.  SKIN: no apparent skin lesion or wound NEURO: Awake and alert. Oriented to self, place and person.  No apparent focal neuro deficit. PSYCH: Calm. Normal affect.   Procedures:  None  Microbiology  summarized:  Assessment & Plan: Severe sepsis due to UTI-urine culture with staph hemolyticus-had fever, tachycardia, tachypnea, elevated LA and AKI.  Sepsis physiology resolving. -Antibiotic de-escalated to cefadroxil to complete treatment course  Diarrhea-pancreatic insufficiency or chemo?  C. difficile and GIP negative. -Imodium as needed -Continue IV fluid   AKI: Likely combination of prerenal from GI loss and ATN from rhabdomyolysis. Recent Labs    05/28/21 1057 06/04/21 1104 06/12/21 0931 06/25/21 0807 07/02/21 0808 07/12/21 1139 07/13/21 0043 07/14/21 1033 07/15/21 0042 07/16/21 0139  BUN 69* 32* 29* 18 15 19 23 20 13 10   CREATININE 1.93* 1.61* 1.59* 1.45* 1.14 2.27* 2.01* 1.70* 1.35* 1.27*  -Continue IV fluid -Discontinue Foley catheter  Rhabdomyolysis-unclear etiology but could be traumatic or from sepsis.  Improving. -IV fluid as above. -Recheck CK in the morning -Continue holding HCTZ  Chronic systolic CHF: TTE in 8119 with LVEF of 45 to 50%, diffuse hypokinesis.  BNP elevated to 200s but appears dehydrated on presentation.  Does not seem to be on diuretics at home. -Monitor fluid status while on IV fluid  Elevated LFT-likely from rhabdo.  Improved. -Manage rhabdo as above -Recheck in the morning  Confusion/delirium-oriented x4 except date and month.  -Reorientation and delirium precautions. -Avoid or minimize sedating medications  Macrocytic anemia: H&H relatively stable.  B12 high.  Folic acid normal. Recent Labs  06/04/21 1104 06/12/21 0931 06/25/21 0807 07/02/21 0808 07/12/21 1139 07/13/21 0043 07/13/21 1510 07/14/21 1033 07/15/21 0042 07/16/21 0139  HGB 10.1* 8.8* 8.8* 8.6* 8.4* 7.8* 7.8* 8.8* 8.0* 8.3*  -Monitor  Essential hypertension: BP within acceptable range. -As needed hydralazine   Chronic A. Fib -Continue Eliquis-increase dose to 5 mg twice daily   Fall/generalized weakness/deconditioning -PT/OT   Metastatic pancreatic  cancer-on chemo prior to admission -Appreciate oncology input-chemo on hold   BPH with urinary retention: Does intermittent catheter at home. -Voiding trial today.  Moderate malnutrition Body mass index is 22.43 kg/m. Nutrition Problem: Moderate Malnutrition Etiology: chronic illness (metastatic pancreatic cancer) Signs/Symptoms: moderate muscle depletion, moderate fat depletion Interventions: Ensure Enlive (each supplement provides 350kcal and 20 grams of protein), MVI   DVT prophylaxis:  Place and maintain sequential compression device Start: 07/14/21 1123 Place TED hose Start: 07/14/21 1123 Place and maintain sequential compression device Start: 07/13/21 0737 apixaban (ELIQUIS) tablet 5 mg  Code Status: DNR/DNI  Family Communication: Updated patient's wife at the bedside. Level of care: MedSurg Status is: Inpatient  Remains inpatient appropriate because:IV treatments appropriate due to intensity of illness or inability to take PO and Inpatient level of care appropriate due to severity of illness  Dispo: The patient is from: Home              Anticipated d/c is to: Home              Patient currently is not medically stable to d/c.   Difficult to place patient No       Consultants:  Hematology/oncology   Sch Meds:  Scheduled Meds:  acidophilus  2 capsule Oral TID   apixaban  5 mg Oral BID   cefadroxil  1,000 mg Oral BID   Chlorhexidine Gluconate Cloth  6 each Topical Daily   feeding supplement  237 mL Oral BID BM   ferrous sulfate  325 mg Oral Q breakfast   melatonin  6 mg Oral QHS   multivitamin with minerals  1 tablet Oral Daily   tamsulosin  0.4 mg Oral BID PC   Continuous Infusions:  lactated ringers 125 mL/hr at 07/16/21 1035   PRN Meds:.acetaminophen **OR** acetaminophen, cromolyn, hydrALAZINE, metoprolol tartrate, ondansetron **OR** ondansetron (ZOFRAN) IV  Antimicrobials: Anti-infectives (From admission, onward)    Start     Dose/Rate Route  Frequency Ordered Stop   07/15/21 2200  cefadroxil (DURICEF) capsule 1,000 mg        1,000 mg Oral 2 times daily 07/15/21 0844 07/19/21 0959   07/14/21 2200  linezolid (ZYVOX) tablet 600 mg  Status:  Discontinued        600 mg Oral Every 12 hours 07/14/21 1906 07/15/21 0844   07/13/21 0200  ceFEPIme (MAXIPIME) 2 g in sodium chloride 0.9 % 100 mL IVPB  Status:  Discontinued        2 g 200 mL/hr over 30 Minutes Intravenous Every 12 hours 07/12/21 1339 07/14/21 1601   07/12/21 1339  vancomycin variable dose per unstable renal function (pharmacist dosing)  Status:  Discontinued         Does not apply See admin instructions 07/12/21 1339 07/12/21 1616   07/12/21 1200  ceFEPIme (MAXIPIME) 2 g in sodium chloride 0.9 % 100 mL IVPB        2 g 200 mL/hr over 30 Minutes Intravenous  Once 07/12/21 1150 07/12/21 1340   07/12/21 1200  metroNIDAZOLE (FLAGYL) IVPB 500 mg  500 mg 100 mL/hr over 60 Minutes Intravenous  Once 07/12/21 1150 07/12/21 1410   07/12/21 1200  vancomycin (VANCOREADY) IVPB 1500 mg/300 mL        1,500 mg 150 mL/hr over 120 Minutes Intravenous  Once 07/12/21 1150 07/12/21 1838        I have personally reviewed the following labs and images: CBC: Recent Labs  Lab 07/12/21 1139 07/13/21 0043 07/13/21 1510 07/14/21 1033 07/15/21 0042 07/16/21 0139  WBC 4.9 4.4 3.7* 4.6 4.8 4.9  NEUTROABS 3.0  --   --  2.0 3.0 3.1  HGB 8.4* 7.8* 7.8* 8.8* 8.0* 8.3*  HCT 26.4* 23.6* 24.1* 27.3* 25.0* 25.7*  MCV 110.9* 106.8* 108.6* 111.4* 108.2* 109.4*  PLT 137* 154 179 224 217 267   BMP &GFR Recent Labs  Lab 07/12/21 1139 07/12/21 2015 07/13/21 0043 07/14/21 1033 07/15/21 0042 07/16/21 0139  NA 141  --  140 139 140 140  K 3.4*  --  3.3* 3.5 3.9 3.4*  CL 111  --  111 109 112* 108  CO2 16*  --  21* 20* 20* 24  GLUCOSE 138*  --  99 123* 91 91  BUN 19  --  23 20 13 10   CREATININE 2.27*  --  2.01* 1.70* 1.35* 1.27*  CALCIUM 8.1*  --  7.8* 8.2* 8.1* 8.3*  MG  --  1.8  --  1.7   --  1.5*  PHOS  --  3.9  --   --   --  2.8   Estimated Creatinine Clearance: 56.5 mL/min (A) (by C-G formula based on SCr of 1.27 mg/dL (H)). Liver & Pancreas: Recent Labs  Lab 07/12/21 1139 07/13/21 0043 07/14/21 1033 07/16/21 0139  AST 86* 105* 99* 67*  ALT 73* 66* 64* 51*  ALKPHOS 213* 166* 175* 161*  BILITOT 1.0 1.1 0.6 0.9  PROT 5.5* 4.8* 5.3* 4.9*  ALBUMIN 2.4* 2.2* 2.2* 2.1*   No results for input(s): LIPASE, AMYLASE in the last 168 hours. No results for input(s): AMMONIA in the last 168 hours. Diabetic: No results for input(s): HGBA1C in the last 72 hours. Recent Labs  Lab 07/12/21 2014 07/13/21 0822  GLUCAP 124* 96   Cardiac Enzymes: Recent Labs  Lab 07/12/21 2015 07/13/21 1510 07/14/21 1033 07/15/21 0042 07/16/21 0139  CKTOTAL 2,582* 2,101* 1,209* 911* 563*   No results for input(s): PROBNP in the last 8760 hours. Coagulation Profile: Recent Labs  Lab 07/12/21 1139  INR 1.8*   Thyroid Function Tests: No results for input(s): TSH, T4TOTAL, FREET4, T3FREE, THYROIDAB in the last 72 hours. Lipid Profile: No results for input(s): CHOL, HDL, LDLCALC, TRIG, CHOLHDL, LDLDIRECT in the last 72 hours. Anemia Panel: No results for input(s): VITAMINB12, FOLATE, FERRITIN, TIBC, IRON, RETICCTPCT in the last 72 hours.  Urine analysis:    Component Value Date/Time   COLORURINE YELLOW 07/12/2021 1637   APPEARANCEUR HAZY (A) 07/12/2021 1637   LABSPEC 1.013 07/12/2021 1637   PHURINE 5.0 07/12/2021 1637   GLUCOSEU NEGATIVE 07/12/2021 1637   HGBUR LARGE (A) 07/12/2021 1637   BILIRUBINUR NEGATIVE 07/12/2021 1637   BILIRUBINUR n 03/05/2015 0947   KETONESUR NEGATIVE 07/12/2021 1637   PROTEINUR 100 (A) 07/12/2021 1637   UROBILINOGEN negative 03/05/2015 0947   NITRITE NEGATIVE 07/12/2021 1637   LEUKOCYTESUR NEGATIVE 07/12/2021 1637   Sepsis Labs: Invalid input(s): PROCALCITONIN, [place]  Microbiology: Recent Results (from the past 240 hour(s))  Blood  Culture (routine x 2)     Status: None (Preliminary result)  Collection Time: 07/12/21 12:08 PM   Specimen: BLOOD  Result Value Ref Range Status   Specimen Description BLOOD SITE NOT SPECIFIED  Final   Special Requests   Final    BOTTLES DRAWN AEROBIC AND ANAEROBIC Blood Culture results may not be optimal due to an inadequate volume of blood received in culture bottles   Culture   Final    NO GROWTH 4 DAYS Performed at [facility], [place] [address], [place], [place] [address]    Report Status PENDING  Incomplete  Blood Culture (routine x 2)     Status: None (Preliminary result)   Collection Time: 07/12/21 12:10 PM   Specimen: BLOOD  Result Value Ref Range Status   Specimen Description BLOOD SITE NOT SPECIFIED  Final   Special Requests   Final    BOTTLES DRAWN AEROBIC AND ANAEROBIC Blood Culture results may not be optimal due to an inadequate volume of blood received in culture bottles   Culture   Final    NO GROWTH 4 DAYS Performed at [facility], [place] [address] [place], [place] [address]    Report Status PENDING  Incomplete  Urine Culture     Status: Abnormal   Collection Time: 07/12/21  4:37 PM   Specimen: Urine, Clean Catch  Result Value Ref Range Status   Specimen Description URINE, CLEAN CATCH  Final   Special Requests   Final    Immunocompromised Performed at [facility], [place] [address] [place], [place] [address]    Culture >=100,000 COLONIES/mL STAPHYLOCOCCUS HAEMOLYTICUS (A)  Final   Report Status 07/15/2021 FINAL  Final   Organism ID, Bacteria STAPHYLOCOCCUS HAEMOLYTICUS (A)  Final      Susceptibility   Staphylococcus haemolyticus - MIC*    CIPROFLOXACIN <=0.5 SENSITIVE Sensitive     GENTAMICIN <=0.5 SENSITIVE Sensitive     NITROFURANTOIN <=16 SENSITIVE Sensitive     OXACILLIN <=0.25 SENSITIVE Sensitive     TETRACYCLINE <=1 SENSITIVE Sensitive     VANCOMYCIN 1 SENSITIVE Sensitive     TRIMETH/SULFA <=10 SENSITIVE Sensitive     CLINDAMYCIN  <=0.25 SENSITIVE Sensitive     RIFAMPIN <=0.5 SENSITIVE Sensitive     Inducible Clindamycin NEGATIVE Sensitive     * >=100,000 COLONIES/mL STAPHYLOCOCCUS HAEMOLYTICUS  Resp Panel by RT-PCR (Flu A&B, Covid) Urine, Clean Catch     Status: None   Collection Time: 07/12/21  4:37 PM   Specimen: Urine, Clean Catch; Nasopharyngeal(NP) swabs in vial transport medium  Result Value Ref Range Status   SARS Coronavirus 2 by RT PCR NEGATIVE NEGATIVE Final    Comment: (NOTE) SARS-CoV-2 target nucleic acids are NOT DETECTED.  The SARS-CoV-2 RNA is generally detectable in upper respiratory specimens during the acute phase of infection. The lowest concentration of SARS-CoV-2 viral copies this assay can detect is 138 copies/mL. A negative result does not preclude SARS-Cov-2 infection and should not be used as the sole basis for treatment or other patient management decisions. A negative result may occur with  improper specimen collection/handling, submission of specimen other than nasopharyngeal swab, presence of viral mutation(s) within the areas targeted by this assay, and inadequate number of viral copies(<138 copies/mL). A negative result must be combined with clinical observations, patient history, and epidemiological information. The expected result is Negative.  Fact Sheet for Patients:  EntrepreneurPulse.com.au  Fact Sheet for Healthcare Providers:  IncredibleEmployment.be  This test is no t yet approved or cleared by the [place] FDA and  has  been authorized for detection and/or diagnosis of SARS-CoV-2 by FDA under an Emergency Use Authorization (EUA). This EUA will remain  in effect (meaning this test can be used) for the duration of the COVID-19 declaration under Section 564(b)(1) of the Act, 21 U.S.C.section 360bbb-3(b)(1), unless the authorization is terminated  or revoked sooner.       Influenza A by PCR NEGATIVE NEGATIVE Final   Influenza  B by PCR NEGATIVE NEGATIVE Final    Comment: (NOTE) The Xpert Xpress SARS-CoV-2/FLU/RSV plus assay is intended as an aid in the diagnosis of influenza from Nasopharyngeal swab specimens and should not be used as a sole basis for treatment. Nasal washings and aspirates are unacceptable for Xpert Xpress SARS-CoV-2/FLU/RSV testing.  Fact Sheet for Patients: EntrepreneurPulse.com.au  Fact Sheet for Healthcare Providers: IncredibleEmployment.be  This test is not yet approved or cleared by the [place] FDA and has been authorized for detection and/or diagnosis of SARS-CoV-2 by FDA under an Emergency Use Authorization (EUA). This EUA will remain in effect (meaning this test can be used) for the duration of the COVID-19 declaration under Section 564(b)(1) of the Act, 21 U.S.C. section 360bbb-3(b)(1), unless the authorization is terminated or revoked.  Performed at [facility], [place] [address] [place], [place] [address]   Gastrointestinal Panel by PCR , Stool     Status: None   Collection Time: 07/14/21 10:34 AM   Specimen: STOOL  Result Value Ref Range Status   Campylobacter species NOT DETECTED NOT DETECTED Final   Plesimonas shigelloides NOT DETECTED NOT DETECTED Final   Salmonella species NOT DETECTED NOT DETECTED Final   Yersinia enterocolitica NOT DETECTED NOT DETECTED Final   Vibrio species NOT DETECTED NOT DETECTED Final   Vibrio cholerae NOT DETECTED NOT DETECTED Final   Enteroaggregative E coli (EAEC) NOT DETECTED NOT DETECTED Final   Enteropathogenic E coli (EPEC) NOT DETECTED NOT DETECTED Final   Enterotoxigenic E coli (ETEC) NOT DETECTED NOT DETECTED Final   Shiga like toxin producing E coli (STEC) NOT DETECTED NOT DETECTED Final   Shigella/Enteroinvasive E coli (EIEC) NOT DETECTED NOT DETECTED Final   Cryptosporidium NOT DETECTED NOT DETECTED Final   Cyclospora cayetanensis NOT DETECTED NOT DETECTED Final   Entamoeba  histolytica NOT DETECTED NOT DETECTED Final   Giardia lamblia NOT DETECTED NOT DETECTED Final   Adenovirus F40/41 NOT DETECTED NOT DETECTED Final   Astrovirus NOT DETECTED NOT DETECTED Final   Norovirus GI/GII NOT DETECTED NOT DETECTED Final   Rotavirus A NOT DETECTED NOT DETECTED Final   Sapovirus (I, II, IV, and V) NOT DETECTED NOT DETECTED Final    Comment: Performed at [facility], [place] [place], [place] [address]  C Difficile Quick Screen w PCR reflex     Status: None   Collection Time: 07/14/21 10:34 AM   Specimen: STOOL  Result Value Ref Range Status   C Diff antigen NEGATIVE NEGATIVE Final   C Diff toxin NEGATIVE NEGATIVE Final   C Diff interpretation No C. difficile detected.  Final    Comment: Performed at [facility], [place] [address] [place], [place] [address]    Radiology Studies: No results found.    Leylah Tarnow T. [place]  If 7PM-7AM, please contact night-coverage [email or web address] 07/16/2021, 2:49 PM

## 2021-07-16 NOTE — Progress Notes (Signed)
Bladder scanned for 220

## 2021-07-17 ENCOUNTER — Telehealth: Payer: Self-pay

## 2021-07-17 DIAGNOSIS — R197 Diarrhea, unspecified: Secondary | ICD-10-CM

## 2021-07-17 DIAGNOSIS — M6282 Rhabdomyolysis: Secondary | ICD-10-CM

## 2021-07-17 DIAGNOSIS — I10 Essential (primary) hypertension: Secondary | ICD-10-CM

## 2021-07-17 DIAGNOSIS — I5022 Chronic systolic (congestive) heart failure: Secondary | ICD-10-CM

## 2021-07-17 LAB — CBC
HCT: 26.3 % — ABNORMAL LOW (ref 39.0–52.0)
Hemoglobin: 8.5 g/dL — ABNORMAL LOW (ref 13.0–17.0)
MCH: 34.8 pg — ABNORMAL HIGH (ref 26.0–34.0)
MCHC: 32.3 g/dL (ref 30.0–36.0)
MCV: 107.8 fL — ABNORMAL HIGH (ref 80.0–100.0)
Platelets: 333 10*3/uL (ref 150–400)
RBC: 2.44 MIL/uL — ABNORMAL LOW (ref 4.22–5.81)
RDW: 22.3 % — ABNORMAL HIGH (ref 11.5–15.5)
WBC: 6 10*3/uL (ref 4.0–10.5)
nRBC: 0.8 % — ABNORMAL HIGH (ref 0.0–0.2)

## 2021-07-17 LAB — CULTURE, BLOOD (ROUTINE X 2)
Culture: NO GROWTH
Culture: NO GROWTH

## 2021-07-17 LAB — RENAL FUNCTION PANEL
Albumin: 2 g/dL — ABNORMAL LOW (ref 3.5–5.0)
Anion gap: 7 (ref 5–15)
BUN: 12 mg/dL (ref 8–23)
CO2: 24 mmol/L (ref 22–32)
Calcium: 8.3 mg/dL — ABNORMAL LOW (ref 8.9–10.3)
Chloride: 109 mmol/L (ref 98–111)
Creatinine, Ser: 1.21 mg/dL (ref 0.61–1.24)
GFR, Estimated: >60 mL/min (ref >60)
Glucose, Bld: 119 mg/dL — ABNORMAL HIGH (ref 70–99)
Phosphorus: 2.6 mg/dL (ref 2.5–4.6)
Potassium: 3.9 mmol/L (ref 3.5–5.1)
Sodium: 140 mmol/L (ref 135–145)

## 2021-07-17 LAB — HEPATIC FUNCTION PANEL
ALT: 44 U/L (ref 0–44)
AST: 57 U/L — ABNORMAL HIGH (ref 15–41)
Albumin: 2 g/dL — ABNORMAL LOW (ref 3.5–5.0)
Alkaline Phosphatase: 162 U/L — ABNORMAL HIGH (ref 38–126)
Bilirubin, Direct: 0.1 mg/dL (ref 0.0–0.2)
Indirect Bilirubin: 0.5 mg/dL (ref 0.3–0.9)
Total Bilirubin: 0.6 mg/dL (ref 0.3–1.2)
Total Protein: 4.9 g/dL — ABNORMAL LOW (ref 6.5–8.1)

## 2021-07-17 LAB — CK: Total CK: 311 U/L (ref 49–397)

## 2021-07-17 LAB — MAGNESIUM: Magnesium: 1.8 mg/dL (ref 1.7–2.4)

## 2021-07-17 MED ORDER — CROMOLYN SODIUM 5.2 MG/ACT NA AERS: 1.0000 | INHALATION_SPRAY | Freq: Two times a day (BID) | NASAL | 0 refills | Status: AC | PRN

## 2021-07-17 MED ORDER — TAMSULOSIN HCL 0.4 MG PO CAPS: 0.4000 mg | ORAL_CAPSULE | Freq: Every day | ORAL | 1 refills | Status: AC

## 2021-07-17 MED ORDER — TAMSULOSIN HCL 0.4 MG PO CAPS: 0.4000 mg | ORAL_CAPSULE | Freq: Two times a day (BID) | ORAL | 1 refills | Status: DC

## 2021-07-17 MED ORDER — CEFADROXIL 500 MG PO CAPS: 1000.0000 mg | ORAL_CAPSULE | Freq: Two times a day (BID) | ORAL | 0 refills | Status: AC

## 2021-07-17 NOTE — Telephone Encounter (Signed)
Transition Care Management Follow-up Telephone Call Date of discharge and from where: [facility] 07/17/21 How have you been since you were released from the hospital? No Any questions or concerns? No  Items Reviewed: Did the pt receive and understand the discharge instructions provided? Yes  Medications obtained and verified? Yes  Other? No  Any new allergies since your discharge? No  Dietary orders reviewed? No Do you have support at home? Yes   Home Care and Equipment/Supplies: Were home health services ordered? yes If so, what is the name of the agency? [facility]  Has the agency set up a time to come to the patient's home? yes Were any new equipment or medical supplies ordered?  No What is the name of the medical supply agency? No Were you able to get the supplies/equipment? no Do you have any questions related to the use of the equipment or supplies? No  Functional Questionnaire: (I = Independent and D = Dependent) ADLs: I  Bathing/Dressing- I  Meal Prep- I  Eating- I  Maintaining continence- I  Transferring/Ambulation- I  Managing Meds- I  Follow up appointments reviewed:  PCP Hospital f/u appt confirmed? Yes  Scheduled to see Dr. Redmond School on 07/20/21 @ 9:30. [facility] f/u appt confirmed? No   Are transportation arrangements needed? No  If their condition worsens, is the pt aware to call PCP or go to the Emergency Dept.? Yes Was the patient provided with contact information for the PCP's office or ED? Yes Was to pt encouraged to call back with questions or concerns? Yes

## 2021-07-17 NOTE — Progress Notes (Signed)
Patient discharged home in stable condition. Transported via [facility]. Verbalizes understanding of all discharge instructions, including home medications and follow up appointments.

## 2021-07-17 NOTE — Discharge Summary (Signed)
Physician Discharge Summary  MACKIE HOLNESS WKM:[id number] DOB: 05-09-1946 DOA: 07/12/2021  PCP: [name], MD  Admit date: 07/12/2021 Discharge date: 07/17/2021 Admitted From: Home Disposition: Home Recommendations for Outpatient Follow-up:  Follow ups as below. Please obtain CBC/CMP/Mag at follow up Please follow up on the following pending results: None Home Health: PT/OT Equipment/Devices: Hospital bed, walker and bedside commode Discharge Condition: Stable CODE STATUS: DNR/DNI  Follow-up Information     [facility] Follow up.   Specialty: Home Health Services Contact information: [address] [place] [address] [phone]         [facility] Follow up.   Why: [place] Contact information: [address] [place] [address] [phone]         [name], MD. Schedule an appointment as soon as possible for a visit in 1 week(s).   Specialty: Family Medicine Contact information: [place] [address] [phone]                Hospital Course: 75 year old M with PMH of metastatic pancreatic cancer on chemo, paroxysmal A. fib on Eliquis, OSA on CPAP, HTN and BPH presenting after syncopal episode and diarrhea, and admitted for AKI, sepsis due to UTI and rhabdomyolysis.  GIP and C. difficile negative.   Patient was started on IV fluid and broad-spectrum antibiotics with IV vancomycin and cefepime.  He also had indwelling Foley catheter placed.  Blood cultures negative.  Urine culture with pansensitive Staphylococcus hemolyticus.  Antibiotics de-escalated to p.o. cefadroxil.  On the day of discharge, he passed voiding trial.  AKI and rhabdomyolysis resolved.  He is discharged on p.o. cefadroxil for 2 more days to complete treatment course.  Home health and DME ordered as recommended by therapy.   See individual problem list below for more on hospital  course.  Discharge Diagnoses:  Severe sepsis due to UTI-urine culture with staph hemolyticus-had fever, tachycardia, tachypnea, elevated LA and AKI.  Sepsis physiology resolved.  Urine culture with pansensitive Staphylococcus hemolyticus. -IV vancomycin and cefepime 10/9-10/12 -P.o. cefadroxil 10/12-10/16.   Diarrhea-pancreatic insufficiency or chemo?  C. difficile and GIP negative.  Resolved. -Imodium as needed   AKI: Likely combination of prerenal from GI loss and ATN from rhabdomyolysis.  Resolved. Recent Labs    06/04/21 1104 06/12/21 0931 06/25/21 0807 07/02/21 0808 07/12/21 1139 07/13/21 0043 07/14/21 1033 07/15/21 0042 07/16/21 0139 07/17/21 0138  BUN 32* 29* $Remov'18 15 19 23 20 13 10 12  'COGlbv$ CREATININE 1.61* 1.59* 1.45* 1.14 2.27* 2.01* 1.70* 1.35* 1.27* 1.21     Rhabdomyolysis-unclear etiology but could be traumatic or from sepsis.  Resolved.   Chronic systolic CHF: TTE in 4599 with LVEF of 45 to 50%, diffuse hypokinesis.  BNP elevated to 200s but appears dehydrated on presentation.  Does not seem to be on diuretics at home.  He tolerated fluid hydration for rhabdomyolysis.  Elevated LFT-likely from rhabdo.  Resolving. -Recheck CMP at follow-up   Confusion/delirium-oriented x4 except date and month.  -Reorientation and delirium precautions.   Macrocytic anemia: H&H relatively stable.  B12 high.  Folic acid normal. Recent Labs    06/12/21 0931 06/25/21 0807 07/02/21 0808 07/12/21 1139 07/13/21 0043 07/13/21 1510 07/14/21 1033 07/15/21 0042 07/16/21 0139 07/17/21 0138  HGB 8.8* 8.8* 8.6* 8.4* 7.8* 7.8* 8.8* 8.0* 8.3* 8.5*  -Recheck CBC at follow-up   Essential hypertension: BP within acceptable range.   Chronic A. Fib: Rate controlled. -Continue Eliquis  Fall/generalized weakness/deconditioning -PT/OT/hospital bed/bedside commode/rolling walker   Metastatic pancreatic cancer-on chemo prior to admission -Outpatient follow-up with oncology.   BPH with  urinary retention: Reportedly does intermittent catheter at home.  Passed voiding trial here. -Continue home medications   Moderate malnutrition Body mass index is 22.43 kg/m. Nutrition Problem: Moderate Malnutrition Etiology: chronic illness (metastatic pancreatic cancer) Signs/Symptoms: moderate muscle depletion, moderate fat depletion Interventions: Ensure Enlive (each supplement provides 350kcal and 20 grams of protein), MVI     Discharge Exam: Vitals:   07/16/21 2107 07/16/21 2130 07/17/21 0309 07/17/21 1131  BP:  136/90 (!) 135/93 (!) 138/92  Pulse: 90 96 88 82  Temp:  (!) 97.4 F (36.3 C) 97.6 F (36.4 C) 97.9 F (36.6 C)  Resp: $Remo'18  18 18  'mSPlD$ Height:      Weight:      SpO2: 98% 99% 100% 100%  TempSrc:  Oral Oral   BMI (Calculated):         GENERAL: No apparent distress.  Nontoxic. HEENT: MMM.  Vision and hearing grossly intact.  NECK: Supple.  No apparent JVD.  RESP: 100% on RA.  No IWOB.  Fair aeration bilaterally. CVS:  RRR. Heart sounds normal.  ABD/GI/GU: Bowel sounds present. Soft. Non tender.  MSK/EXT:  Moves extremities. No apparent deformity. No edema.  SKIN: no apparent skin lesion or wound NEURO: Awake and alert.  Oriented to self, place and person.  No apparent focal neuro deficit. PSYCH: Calm. Normal affect.   Discharge Instructions  Discharge Instructions     Call MD for:  difficulty breathing, headache or visual disturbances   Complete by: As directed    Call MD for:  extreme fatigue   Complete by: As directed    Call MD for:  persistant nausea and vomiting   Complete by: As directed    Call MD for:  severe uncontrolled pain   Complete by: As directed    Call MD for:  temperature >100.4   Complete by: As directed    Diet - low sodium heart healthy   Complete by: As directed    Discharge instructions   Complete by: As directed    It has been a pleasure taking care of you!  You were hospitalized with urinary tract infection, diarrhea, acute  kidney injury and rhabdomyolysis (marginal cell injury).  Your symptoms improved to the point we think it is safe to let you go home and follow-up with your doctors.  We are discharging you more antibiotics to complete treatment course.  Please review your new medication list and the directions on your medication before you take them.   Take care,   Increase activity slowly   Complete by: As directed       Allergies as of 07/17/2021       Reactions   Benzalkonium Chloride    Causes Eye issues Type of preservative         Medication List     TAKE these medications    ABRAXANE IV Inject 1 Bag into the vein once a week. Pt gets for 30 minutes once per week   amLODipine 10 MG tablet Commonly known as: NORVASC TAKE 1 TABLET(10 MG) BY MOUTH DAILY What changed: See the new instructions.   cefadroxil 500 MG capsule Commonly known as: DURICEF Take 2 capsules (1,000 mg total) by mouth 2 (two) times daily for 3 days.   cromolyn 5.2 MG/ACT nasal spray Commonly known as: NASALCROM Place 1 spray into both nostrils 2 (  two) times daily as needed for allergies.   Eliquis 5 MG Tabs tablet Generic drug: apixaban TAKE 1 TABLET(5 MG) BY MOUTH TWICE DAILY What changed: See the new instructions.   GEMZAR IV Inject 1 Bag into the vein once a week. Pt gets for 30 minutes once per week   lidocaine-prilocaine cream Commonly known as: EMLA Apply 1 application topically as needed.   loperamide 2 MG capsule Commonly known as: IMODIUM Take 4 mg by mouth as needed for diarrhea or loose stools.   ondansetron 8 MG tablet Commonly known as: ZOFRAN TAKE 1 TABLET(8 MG) BY MOUTH EVERY 8 HOURS AS NEEDED FOR NAUSEA OR VOMITING   PRESERVISION AREDS 2 PO Take 1 tablet by mouth 2 (two) times daily.   prochlorperazine 10 MG tablet Commonly known as: COMPAZINE TAKE 1 TABLET(10 MG) BY MOUTH EVERY 6 HOURS AS NEEDED FOR NAUSEA OR VOMITING What changed: See the new instructions.   tamsulosin 0.4  MG Caps capsule Commonly known as: FLOMAX Take 1 capsule (0.4 mg total) by mouth daily after supper.   Travatan Z 0.004 % Soln ophthalmic solution Generic drug: Travoprost (BAK Free) Place 1 drop into the right eye at bedtime. Travapost   triamcinolone cream 0.1 % Commonly known as: KENALOG APPLY EXTERNALLY TO THE AFFECTED AREA TWICE DAILY What changed: See the new instructions.               Durable Medical Equipment  (From admission, onward)           Start     Ordered   07/14/21 1232  For home use only DME Hospital bed  Once       Question Answer Comment  Length of Need Lifetime   Patient has (list medical condition): Fall/generalized weakness/deconditioning   The above medical condition requires: Patient requires the ability to reposition frequently   Head must be elevated greater than: 45 degrees   Bed type Semi-electric   Support Surface: Gel Overlay      07/14/21 1231   07/14/21 1229  For home use only DME Walker rolling  Once       Question Answer Comment  Walker: With [place]   Patient needs a walker to treat with the following condition Weakness      07/14/21 1229            Consultations: None  Procedures/Studies:   CT HEAD WO CONTRAST (5MM)  Result Date: 07/12/2021 CLINICAL DATA:  75 year old male EXAM: CT HEAD WITHOUT CONTRAST TECHNIQUE: Contiguous axial images were obtained from the base of the skull through the vertex without intravenous contrast. COMPARISON:  MR 05/21/2021 FINDINGS: Brain: No acute intracranial hemorrhage. No midline shift. Encephalomalacia of the right temporoparietal region, as demonstrated on the prior MRI. Focal hypodensity in the left frontal cortex, corresponding to the diffusion abnormality on prior MRI. No unexpected or new regions of loss of the gray-white differential. Unremarkable appearance of the met trickles. Vascular: Intracranial atherosclerosis of anterior and posterior circulation. Skull: No acute  fracture.  No aggressive bone lesion identified. Sinuses/Orbits: Unremarkable appearance of the orbits. Mastoid air cells clear. No middle ear effusion. No significant sinus disease. Other: None IMPRESSION: No acute intracranial abnormality. 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 Encephalomalacia of remote right temporoparietal infarct. Intracranial atherosclerosis. Electronically Signed   By: Corrie Mckusick D.O.   On: 07/12/2021 13:21   US RENAL  Result Date: 07/12/2021 CLINICAL DATA:  Acute renal insufficiency. EXAM:  RENAL / URINARY TRACT ULTRASOUND COMPLETE COMPARISON:  Abdominal CT May 06, 2021 FINDINGS: Right Kidney: Renal measurements: 13.2 x 5.6 x 4.6 cm = volume: 177.4 mL. Echogenicity within normal limits. No hydronephrosis visualized. Two benign-appearing cysts in the right kidney measure 1.6 and 0.9 cm. Left Kidney: Renal measurements: 10.8 x 4.2 x 4.2 cm = volume: 99.3 mL. Echogenicity within normal limits. No hydronephrosis visualized. Two benign-appearing cysts in the left kidney measuring 1.6 and 1.7 cm. Bladder: Decompressed and therefore poorly evaluated. Other: None. IMPRESSION: Bilateral renal cysts, otherwise normal appearance of the kidneys. Electronically Signed   By: Fidela Salisbury M.D.   On: 07/12/2021 17:27   DG Chest Portable 1 View  Result Date: 07/12/2021 CLINICAL DATA:  Shortness of breath. EXAM: PORTABLE CHEST 1 VIEW COMPARISON:  CT angiogram March 31, 2021 FINDINGS: The mediastinal contour is normal. Right central venous line is identified with distal tip in the superior vena cava. Mild cardiomegaly is noted. The lungs are clear. The visualized skeletal structures are unremarkable. IMPRESSION: No acute cardiopulmonary disease identified. Electronically Signed   By: Abelardo Diesel M.D.   On: 07/12/2021 12:23   DG Abd Portable 1V  Result Date: 07/13/2021 CLINICAL DATA:  Abdominal distension, pain, pancreatic cancer EXAM:  PORTABLE ABDOMEN - 1 VIEW COMPARISON:  05/06/2021 FINDINGS: Supine frontal view of the abdomen and pelvis excludes the bilateral flanks and hemidiaphragms by collimation. Bowel gas pattern is unremarkable without obstruction or ileus. No abdominal masses or abnormal calcifications. No acute bony abnormalities. IMPRESSION: 1. Unremarkable bowel gas pattern. Electronically Signed   By: Randa Ngo M.D.   On: 07/13/2021 19:06       The results of significant diagnostics from this hospitalization (including imaging, microbiology, ancillary and laboratory) are listed below for reference.     Microbiology: Recent Results (from the past 240 hour(s))  Blood Culture (routine x 2)     Status: None   Collection Time: 07/12/21 12:08 PM   Specimen: BLOOD  Result Value Ref Range Status   Specimen Description BLOOD SITE NOT SPECIFIED  Final   Special Requests   Final    BOTTLES DRAWN AEROBIC AND ANAEROBIC Blood Culture results may not be optimal due to an inadequate volume of blood received in culture bottles   Culture   Final    NO GROWTH 5 DAYS Performed at [facility], [place] [address], [place], [place] [address]    Report Status 07/17/2021 FINAL  Final  Blood Culture (routine x 2)     Status: None   Collection Time: 07/12/21 12:10 PM   Specimen: BLOOD  Result Value Ref Range Status   Specimen Description BLOOD SITE NOT SPECIFIED  Final   Special Requests   Final    BOTTLES DRAWN AEROBIC AND ANAEROBIC Blood Culture results may not be optimal due to an inadequate volume of blood received in culture bottles   Culture   Final    NO GROWTH 5 DAYS Performed at [facility], [place] [address], [place], [place] [address]    Report Status 07/17/2021 FINAL  Final  Urine Culture     Status: Abnormal   Collection Time: 07/12/21  4:37 PM   Specimen: Urine, Clean Catch  Result Value Ref Range Status   Specimen Description URINE, CLEAN CATCH  Final   Special Requests   Final     Immunocompromised Performed at [facility], [place] [address], [place], [place] [address]    Culture >=100,000 COLONIES/mL STAPHYLOCOCCUS  HAEMOLYTICUS (A)  Final   Report Status 07/15/2021 FINAL  Final   Organism ID, Bacteria STAPHYLOCOCCUS HAEMOLYTICUS (A)  Final      Susceptibility   Staphylococcus haemolyticus - MIC*    CIPROFLOXACIN <=0.5 SENSITIVE Sensitive     GENTAMICIN <=0.5 SENSITIVE Sensitive     NITROFURANTOIN <=16 SENSITIVE Sensitive     OXACILLIN <=0.25 SENSITIVE Sensitive     TETRACYCLINE <=1 SENSITIVE Sensitive     VANCOMYCIN 1 SENSITIVE Sensitive     TRIMETH/SULFA <=10 SENSITIVE Sensitive     CLINDAMYCIN <=0.25 SENSITIVE Sensitive     RIFAMPIN <=0.5 SENSITIVE Sensitive     Inducible Clindamycin NEGATIVE Sensitive     * >=100,000 COLONIES/mL STAPHYLOCOCCUS HAEMOLYTICUS  Resp Panel by RT-PCR (Flu A&B, Covid) Urine, Clean Catch     Status: None   Collection Time: 07/12/21  4:37 PM   Specimen: Urine, Clean Catch; Nasopharyngeal(NP) swabs in vial transport medium  Result Value Ref Range Status   SARS Coronavirus 2 by RT PCR NEGATIVE NEGATIVE Final    Comment: (NOTE) SARS-CoV-2 target nucleic acids are NOT DETECTED.  The SARS-CoV-2 RNA is generally detectable in upper respiratory specimens during the acute phase of infection. The lowest concentration of SARS-CoV-2 viral copies this assay can detect is 138 copies/mL. A negative result does not preclude SARS-Cov-2 infection and should not be used as the sole basis for treatment or other patient management decisions. A negative result may occur with  improper specimen collection/handling, submission of specimen other than nasopharyngeal swab, presence of viral mutation(s) within the areas targeted by this assay, and inadequate number of viral copies(<138 copies/mL). A negative result must be combined with clinical observations, patient history, and epidemiological information. The expected result is Negative.  Fact  Sheet for Patients:  BloggerCourse.com  Fact Sheet for Healthcare Providers:  SeriousBroker.it  This test is no t yet approved or cleared by the [place] FDA and  has been authorized for detection and/or diagnosis of SARS-CoV-2 by FDA under an Emergency Use Authorization (EUA). This EUA will remain  in effect (meaning this test can be used) for the duration of the COVID-19 declaration under Section 564(b)(1) of the Act, 21 U.S.C.section 360bbb-3(b)(1), unless the authorization is terminated  or revoked sooner.       Influenza A by PCR NEGATIVE NEGATIVE Final   Influenza B by PCR NEGATIVE NEGATIVE Final    Comment: (NOTE) The Xpert Xpress SARS-CoV-2/FLU/RSV plus assay is intended as an aid in the diagnosis of influenza from Nasopharyngeal swab specimens and should not be used as a sole basis for treatment. Nasal washings and aspirates are unacceptable for Xpert Xpress SARS-CoV-2/FLU/RSV testing.  Fact Sheet for Patients: BloggerCourse.com  Fact Sheet for Healthcare Providers: SeriousBroker.it  This test is not yet approved or cleared by the [place] FDA and has been authorized for detection and/or diagnosis of SARS-CoV-2 by FDA under an Emergency Use Authorization (EUA). This EUA will remain in effect (meaning this test can be used) for the duration of the COVID-19 declaration under Section 564(b)(1) of the Act, 21 U.S.C. section 360bbb-3(b)(1), unless the authorization is terminated or revoked.  Performed at [facility], [address], [place], [place] [address]   Gastrointestinal Panel by PCR , Stool     Status: None   Collection Time: 07/14/21 10:34 AM   Specimen: STOOL  Result Value Ref Range Status   Campylobacter species NOT DETECTED NOT DETECTED Final   Plesimonas shigelloides NOT DETECTED NOT DETECTED Final  Salmonella species NOT DETECTED NOT  DETECTED Final   Yersinia enterocolitica NOT DETECTED NOT DETECTED Final   Vibrio species NOT DETECTED NOT DETECTED Final   Vibrio cholerae NOT DETECTED NOT DETECTED Final   Enteroaggregative E coli (EAEC) NOT DETECTED NOT DETECTED Final   Enteropathogenic E coli (EPEC) NOT DETECTED NOT DETECTED Final   Enterotoxigenic E coli (ETEC) NOT DETECTED NOT DETECTED Final   Shiga like toxin producing E coli (STEC) NOT DETECTED NOT DETECTED Final   Shigella/Enteroinvasive E coli (EIEC) NOT DETECTED NOT DETECTED Final   Cryptosporidium NOT DETECTED NOT DETECTED Final   Cyclospora cayetanensis NOT DETECTED NOT DETECTED Final   Entamoeba histolytica NOT DETECTED NOT DETECTED Final   Giardia lamblia NOT DETECTED NOT DETECTED Final   Adenovirus F40/41 NOT DETECTED NOT DETECTED Final   Astrovirus NOT DETECTED NOT DETECTED Final   Norovirus GI/GII NOT DETECTED NOT DETECTED Final   Rotavirus A NOT DETECTED NOT DETECTED Final   Sapovirus (I, II, IV, and V) NOT DETECTED NOT DETECTED Final    Comment: Performed at [facility], [address] [place], [place] [address]  C Difficile Quick Screen w PCR reflex     Status: None   Collection Time: 07/14/21 10:34 AM   Specimen: STOOL  Result Value Ref Range Status   C Diff antigen NEGATIVE NEGATIVE Final   C Diff toxin NEGATIVE NEGATIVE Final   C Diff interpretation No C. difficile detected.  Final    Comment: Performed at [facility], [place] [address], [place], [place] [address]     Labs:  CBC: Recent Labs  Lab 07/12/21 1139 07/13/21 0043 07/13/21 1510 07/14/21 1033 07/15/21 0042 07/16/21 0139 07/17/21 0138  WBC 4.9   < > 3.7* 4.6 4.8 4.9 6.0  NEUTROABS 3.0  --   --  2.0 3.0 3.1  --   HGB 8.4*   < > 7.8* 8.8* 8.0* 8.3* 8.5*  HCT 26.4*   < > 24.1* 27.3* 25.0* 25.7* 26.3*  MCV 110.9*   < > 108.6* 111.4* 108.2* 109.4* 107.8*  PLT 137*   < > 179 224 217 267 333   < > = values in this interval not displayed.   BMP &GFR Recent Labs   Lab 07/12/21 2015 07/13/21 0043 07/14/21 1033 07/15/21 0042 07/16/21 0139 07/17/21 0138  NA  --  140 139 140 140 140  K  --  3.3* 3.5 3.9 3.4* 3.9  CL  --  111 109 112* 108 109  CO2  --  21* 20* 20* 24 24  GLUCOSE  --  99 123* 91 91 119*  BUN  --  $R'23 20 13 10 12  'yv$ CREATININE  --  2.01* 1.70* 1.35* 1.27* 1.21  CALCIUM  --  7.8* 8.2* 8.1* 8.3* 8.3*  MG 1.8  --  1.7  --  1.5* 1.8  PHOS 3.9  --   --   --  2.8 2.6   Estimated Creatinine Clearance: 59.3 mL/min (by C-G formula based on SCr of 1.21 mg/dL). Liver & Pancreas: Recent Labs  Lab 07/12/21 1139 07/13/21 0043 07/14/21 1033 07/16/21 0139 07/17/21 0138  AST 86* 105* 99* 67* 57*  ALT 73* 66* 64* 51* 44  ALKPHOS 213* 166* 175* 161* 162*  BILITOT 1.0 1.1 0.6 0.9 0.6  PROT 5.5* 4.8* 5.3* 4.9* 4.9*  ALBUMIN 2.4* 2.2* 2.2* 2.1* 2.0*  2.0*   No results for input(s): LIPASE, AMYLASE in the last 168 hours. No results for input(s): AMMONIA  in the last 168 hours. Diabetic: No results for input(s): HGBA1C in the last 72 hours. Recent Labs  Lab 07/12/21 2014 07/13/21 0822  GLUCAP 124* 96   Cardiac Enzymes: Recent Labs  Lab 07/13/21 1510 07/14/21 1033 07/15/21 0042 07/16/21 0139 07/17/21 0138  CKTOTAL 2,101* 1,209* 911* 563* 311   No results for input(s): PROBNP in the last 8760 hours. Coagulation Profile: Recent Labs  Lab 07/12/21 1139  INR 1.8*   Thyroid Function Tests: No results for input(s): TSH, T4TOTAL, FREET4, T3FREE, THYROIDAB in the last 72 hours. Lipid Profile: No results for input(s): CHOL, HDL, LDLCALC, TRIG, CHOLHDL, LDLDIRECT in the last 72 hours. Anemia Panel: No results for input(s): VITAMINB12, FOLATE, FERRITIN, TIBC, IRON, RETICCTPCT in the last 72 hours. Urine analysis:    Component Value Date/Time   COLORURINE YELLOW 07/12/2021 1637   APPEARANCEUR HAZY (A) 07/12/2021 1637   LABSPEC 1.013 07/12/2021 1637   PHURINE 5.0 07/12/2021 1637   GLUCOSEU NEGATIVE 07/12/2021 1637   HGBUR LARGE  (A) 07/12/2021 1637   BILIRUBINUR NEGATIVE 07/12/2021 1637   BILIRUBINUR n 03/05/2015 0947   KETONESUR NEGATIVE 07/12/2021 1637   PROTEINUR 100 (A) 07/12/2021 1637   UROBILINOGEN negative 03/05/2015 0947   NITRITE NEGATIVE 07/12/2021 1637   LEUKOCYTESUR NEGATIVE 07/12/2021 1637   Sepsis Labs: Invalid input(s): PROCALCITONIN, LACTICIDVEN   Time coordinating discharge: 55 minutes  SIGNED:  Mercy Riding, MD  [facility] 07/17/2021, 2:21 PM

## 2021-07-17 NOTE — Care Management (Addendum)
Spoke to patient and wife MaryEllen at bedside. Wife has received DME at home. They are requesting ambulance transport home.   NCM called [facility] for prior authorization. Spoke to [employer].  Luke at [employer] has a 1 pm time he will keep open for patient.   Tammy cannot guarantee she will have a determination by 1pm.   [place] Reader and patient aware of above.     Scott with [facility] returned call with ambulance transportation approval [place] back , they have auth number and can transport patient at 1 pm. Patient, wife and nurse aware. Face sheet , DNR form and medical necessity in chart, per Lurena Joiner that is all paperwork needed.

## 2021-07-17 NOTE — Plan of Care (Signed)

## 2021-07-17 NOTE — Progress Notes (Signed)
Physical Therapy Treatment Patient Details Name: Lee Kim MRN: [id number] DOB: April 18, 1946 Today's Date: 07/17/2021   History of Present Illness Pt is 75 y.o. male admitted 07/12/21 with diarrhea, syncopal episode resulting in fall. Workup for AKI, sepsis secondary to UTI and rhabdomyolysis. Head CT negative for acute abnormality. 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    PT Comments    Patient received in bed, pleasant and cooperative but perseverating on ambulance ride home. Easily redirected but with poor insight into deficits and safety. Has a very difficult time with sit to stands/stand to sits from standard height surfaces. Also needs lots of step by step cues to maintain safety and good sequencing this morning. Spouse present and educated on mobility assist levels, also on how to cue him for safe mobility; I also feel that Gershon Mussel would benefit from a lift chair at home until he can get a bit stronger. Left up in recliner with all needs met, chair alarm active and spouse present; they continue to prefer to return home at [place]. Will continue efforts while he remains in house.     Recommendations for follow up therapy are one component of a multi-disciplinary discharge planning process, led by the attending physician.  Recommendations may be updated based on patient status, additional functional criteria and insurance authorization.  Follow Up Recommendations  Home health PT;Supervision for mobility/OOB     Equipment Recommendations  Rolling walker with 5" wheels;3in1 (PT);Hospital bed;Other (comment) (might also benefit from lift chair)    Recommendations for Other Services       Precautions / Restrictions Precautions Precautions: Fall Restrictions Weight Bearing Restrictions: No     Mobility  Bed Mobility Overal bed mobility: Needs Assistance Bed Mobility: Supine to Sit     Supine to sit: Min assist;HOB  elevated     General bed mobility comments: able to manage BLEs today, did need MinA for balance when transitioning to midline sitting and scooting hips around    Transfers Overall transfer level: Needs assistance Equipment used: Rolling walker (2 wheeled) Transfers: Sit to/from Stand Sit to Stand: Max assist         General transfer comment: able to stand from elevated surface with MaxA, multiple attempts, and mod cues for sequencing; was able to boost up to full upright with MinA from elevated bed and heavy cues for hand placement/sequencing  Ambulation/Gait Ambulation/Gait assistance: Min assist Gait Distance (Feet): 20 Feet Assistive device: Rolling walker (2 wheeled) Gait Pattern/deviations: Step-through pattern;Decreased stride length;Drifts right/left;Trunk flexed Gait velocity: Decreased   General Gait Details: gait speed better today but still with multidirectional unsteadiness requiring MinA for safety; did better with RW today but needed cues to slow down as fatigue increased (began to rush to get into chair)   Stairs             Wheelchair Mobility    Modified Rankin (Stroke Patients Only)       Balance Overall balance assessment: Needs assistance Sitting-balance support: Feet supported Sitting balance-Leahy Scale: Good     Standing balance support: During functional activity;Bilateral upper extremity supported Standing balance-Leahy Scale: Poor Standing balance comment: reliant on BUE support, also benefits from external support                            Cognition Arousal/Alertness: Awake/alert Behavior During Therapy: WFL for tasks assessed/performed Overall Cognitive Status: Impaired/Different from baseline Area of Impairment:  Memory;Safety/judgement;Awareness;Problem solving                     Memory: Decreased short-term memory   Safety/Judgement: Decreased awareness of safety;Decreased awareness of  deficits Awareness: Intellectual Problem Solving: Slow processing;Requires verbal cues;Difficulty sequencing General Comments: poor awareness of overall deficits and impaired safety; kept perseverating on wanting to be ready for the ambulance to come so he can go home. A bit tangential but pleasant and cooperative.      Exercises      General Comments        Pertinent Vitals/Pain Pain Assessment: 0-10 Pain Score: 3  Pain Location: B knees and IV site on R hand Pain Descriptors / Indicators: Aching;Discomfort Pain Intervention(s): Limited activity within patient's tolerance;Monitored during session    Home Living                      Prior Function            PT Goals (current goals can now be found in the care plan section) Acute Rehab PT Goals Patient Stated Goal: Pt's goal is to return home PT Goal Formulation: With patient/family Time For Goal Achievement: 07/28/21 Potential to Achieve Goals: Good Progress towards PT goals: Progressing toward goals    Frequency    Min 3X/week      PT Plan Current plan remains appropriate;Equipment recommendations need to be updated    Co-evaluation              AM-PAC PT "6 Clicks" Mobility   Outcome Measure  Help needed turning from your back to your side while in a flat bed without using bedrails?: A Little Help needed moving from lying on your back to sitting on the side of a flat bed without using bedrails?: A Little Help needed moving to and from a bed to a chair (including a wheelchair)?: A Little Help needed standing up from a chair using your arms (e.g., wheelchair or bedside chair)?: A Lot Help needed to walk in hospital room?: A Little Help needed climbing 3-5 steps with a railing? : A Lot 6 Click Score: 16    End of Session Equipment Utilized During Treatment: Gait belt Activity Tolerance: Patient tolerated treatment well;Patient limited by fatigue Patient left: in chair;with call bell/phone  within reach;with chair alarm set Nurse Communication: Mobility status PT Visit Diagnosis: Unsteadiness on feet (R26.81);History of falling (Z91.81);Muscle weakness (generalized) (M62.81)     Time: 3662-9476 PT Time Calculation (min) (ACUTE ONLY): 19 min  Charges:  $Gait Training: 8-22 mins                    Windell Norfolk, DPT, PN2   Supplemental Physical Therapist [place]    Pager [phone] Acute Rehab Office [phone]

## 2021-07-20 ENCOUNTER — Inpatient Hospital Stay: Payer: PPO | Admitting: Family Medicine

## 2021-07-21 DIAGNOSIS — I480 Paroxysmal atrial fibrillation: Secondary | ICD-10-CM | POA: Diagnosis not present

## 2021-07-21 DIAGNOSIS — N390 Urinary tract infection, site not specified: Secondary | ICD-10-CM | POA: Diagnosis not present

## 2021-07-21 DIAGNOSIS — Z792 Long term (current) use of antibiotics: Secondary | ICD-10-CM | POA: Diagnosis not present

## 2021-07-21 DIAGNOSIS — Z79899 Other long term (current) drug therapy: Secondary | ICD-10-CM | POA: Diagnosis not present

## 2021-07-21 DIAGNOSIS — C259 Malignant neoplasm of pancreas, unspecified: Secondary | ICD-10-CM | POA: Diagnosis not present

## 2021-07-21 DIAGNOSIS — N401 Enlarged prostate with lower urinary tract symptoms: Secondary | ICD-10-CM | POA: Diagnosis not present

## 2021-07-21 DIAGNOSIS — I5022 Chronic systolic (congestive) heart failure: Secondary | ICD-10-CM | POA: Diagnosis not present

## 2021-07-21 DIAGNOSIS — I110 Hypertensive heart disease with heart failure: Secondary | ICD-10-CM | POA: Diagnosis not present

## 2021-07-21 DIAGNOSIS — D630 Anemia in neoplastic disease: Secondary | ICD-10-CM | POA: Diagnosis not present

## 2021-07-21 DIAGNOSIS — A412 Sepsis due to unspecified staphylococcus: Secondary | ICD-10-CM | POA: Diagnosis not present

## 2021-07-21 DIAGNOSIS — E669 Obesity, unspecified: Secondary | ICD-10-CM | POA: Diagnosis not present

## 2021-07-21 DIAGNOSIS — R338 Other retention of urine: Secondary | ICD-10-CM | POA: Diagnosis not present

## 2021-07-21 DIAGNOSIS — G4733 Obstructive sleep apnea (adult) (pediatric): Secondary | ICD-10-CM | POA: Diagnosis not present

## 2021-07-21 DIAGNOSIS — Z7901 Long term (current) use of anticoagulants: Secondary | ICD-10-CM | POA: Diagnosis not present

## 2021-07-21 DIAGNOSIS — Z6823 Body mass index (BMI) 23.0-23.9, adult: Secondary | ICD-10-CM | POA: Diagnosis not present

## 2021-07-21 DIAGNOSIS — Z9181 History of falling: Secondary | ICD-10-CM | POA: Diagnosis not present

## 2021-07-21 DIAGNOSIS — Z452 Encounter for adjustment and management of vascular access device: Secondary | ICD-10-CM | POA: Diagnosis not present

## 2021-07-21 DIAGNOSIS — Q6102 Congenital multiple renal cysts: Secondary | ICD-10-CM | POA: Diagnosis not present

## 2021-07-21 DIAGNOSIS — E440 Moderate protein-calorie malnutrition: Secondary | ICD-10-CM | POA: Diagnosis not present

## 2021-07-22 ENCOUNTER — Ambulatory Visit (INDEPENDENT_AMBULATORY_CARE_PROVIDER_SITE_OTHER): Payer: PPO | Admitting: Family Medicine

## 2021-07-22 ENCOUNTER — Other Ambulatory Visit: Payer: Self-pay

## 2021-07-22 VITALS — HR 68 | Temp 96.5°F

## 2021-07-22 DIAGNOSIS — N390 Urinary tract infection, site not specified: Secondary | ICD-10-CM

## 2021-07-22 DIAGNOSIS — C772 Secondary and unspecified malignant neoplasm of intra-abdominal lymph nodes: Secondary | ICD-10-CM | POA: Diagnosis not present

## 2021-07-22 DIAGNOSIS — Z23 Encounter for immunization: Secondary | ICD-10-CM | POA: Diagnosis not present

## 2021-07-22 DIAGNOSIS — N179 Acute kidney failure, unspecified: Secondary | ICD-10-CM | POA: Diagnosis not present

## 2021-07-22 DIAGNOSIS — C259 Malignant neoplasm of pancreas, unspecified: Secondary | ICD-10-CM | POA: Diagnosis not present

## 2021-07-22 DIAGNOSIS — R531 Weakness: Secondary | ICD-10-CM

## 2021-07-22 DIAGNOSIS — I493 Ventricular premature depolarization: Secondary | ICD-10-CM

## 2021-07-22 NOTE — Progress Notes (Signed)
   Subjective:    Patient ID: Lee Kim, male    DOB: [birth year], 75 y.o.   MRN: [id number]  HPI He is here for a follow-up visit after recent hospitalization and treatment for AKA, rhabdomyolysis, UTI.  He has underlying pancreatic cancer.  He is in the middle of getting chemotherapy.  He has a another appointment for tomorrow for that.  He states that it makes him feel very weak and lose control of his legs.  His main concern is inability to sleep.  He was given melatonin in the past but has not worked.  He states the main issue is nasal congestion keeping him from sleeping.  He is using Flonase and getting some relief with this.  No sneezing itchy watery eyes or rhinorrhea.  Review of the note indicates that he should have finished the antibiotic but his wife states that he still has a couple pills left of the antibiotic.   Review of Systems     Objective:   Physical Exam Alert and complaining of fatigue.  Cardiac exam shows a slightly irregular rhythm.  Lungs are clear to auscultation.  Abdominal exam shows no tenderness to palpation. The emergency room record as well as discharge summary and lab data was reviewed.      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  Hopefully this will help him sleep through the night as nasal congestion seems to be his main complaint with insomnia. He will continue with the rest of the antibiotic. Chemotherapy is scheduled for tomorrow. Encouraged him and his  wife to always bring in all of his medications with each visit to the doctor to ensure medication compliance. Encouraged him to call me if they have any questions about their care or if they feel like they are lost in the shuffle.

## 2021-07-22 NOTE — Patient Instructions (Signed)
Use Afrin nasal spray only at night.  Stay on the Flonase as well

## 2021-07-23 ENCOUNTER — Encounter: Payer: Self-pay | Admitting: Hematology and Oncology

## 2021-07-23 ENCOUNTER — Inpatient Hospital Stay: Payer: PPO

## 2021-07-23 ENCOUNTER — Telehealth: Payer: Self-pay | Admitting: *Deleted

## 2021-07-23 ENCOUNTER — Inpatient Hospital Stay (HOSPITAL_BASED_OUTPATIENT_CLINIC_OR_DEPARTMENT_OTHER): Payer: PPO | Admitting: Hematology and Oncology

## 2021-07-23 VITALS — BP 131/78 | HR 95 | Temp 97.8°F | Resp 16

## 2021-07-23 DIAGNOSIS — R0602 Shortness of breath: Secondary | ICD-10-CM | POA: Diagnosis not present

## 2021-07-23 DIAGNOSIS — D6481 Anemia due to antineoplastic chemotherapy: Secondary | ICD-10-CM

## 2021-07-23 DIAGNOSIS — C259 Malignant neoplasm of pancreas, unspecified: Secondary | ICD-10-CM | POA: Diagnosis not present

## 2021-07-23 DIAGNOSIS — C772 Secondary and unspecified malignant neoplasm of intra-abdominal lymph nodes: Secondary | ICD-10-CM | POA: Diagnosis not present

## 2021-07-23 DIAGNOSIS — Z95828 Presence of other vascular implants and grafts: Secondary | ICD-10-CM

## 2021-07-23 DIAGNOSIS — G4733 Obstructive sleep apnea (adult) (pediatric): Secondary | ICD-10-CM | POA: Diagnosis not present

## 2021-07-23 DIAGNOSIS — E669 Obesity, unspecified: Secondary | ICD-10-CM | POA: Diagnosis not present

## 2021-07-23 DIAGNOSIS — T451X5D Adverse effect of antineoplastic and immunosuppressive drugs, subsequent encounter: Secondary | ICD-10-CM

## 2021-07-23 DIAGNOSIS — R110 Nausea: Secondary | ICD-10-CM | POA: Diagnosis not present

## 2021-07-23 DIAGNOSIS — F17210 Nicotine dependence, cigarettes, uncomplicated: Secondary | ICD-10-CM | POA: Diagnosis not present

## 2021-07-23 DIAGNOSIS — R197 Diarrhea, unspecified: Secondary | ICD-10-CM | POA: Diagnosis not present

## 2021-07-23 DIAGNOSIS — R5383 Other fatigue: Secondary | ICD-10-CM | POA: Diagnosis not present

## 2021-07-23 DIAGNOSIS — T451X5A Adverse effect of antineoplastic and immunosuppressive drugs, initial encounter: Secondary | ICD-10-CM

## 2021-07-23 DIAGNOSIS — I10 Essential (primary) hypertension: Secondary | ICD-10-CM | POA: Diagnosis not present

## 2021-07-23 LAB — COMPREHENSIVE METABOLIC PANEL
ALT: 24 IU/L (ref 0–44)
AST: 33 IU/L (ref 0–40)
Albumin/Globulin Ratio: 1.1 — ABNORMAL LOW (ref 1.2–2.2)
Albumin: 3 g/dL — ABNORMAL LOW (ref 3.7–4.7)
Alkaline Phosphatase: 210 IU/L — ABNORMAL HIGH (ref 44–121)
BUN/Creatinine Ratio: 10 (ref 10–24)
BUN: 12 mg/dL (ref 8–27)
Bilirubin Total: 0.4 mg/dL (ref 0.0–1.2)
CO2: 21 mmol/L (ref 20–29)
Calcium: 8.7 mg/dL (ref 8.6–10.2)
Chloride: 103 mmol/L (ref 96–106)
Creatinine, Ser: 1.21 mg/dL (ref 0.76–1.27)
Globulin, Total: 2.8 g/dL (ref 1.5–4.5)
Glucose: 98 mg/dL (ref 70–99)
Potassium: 4.4 mmol/L (ref 3.5–5.2)
Sodium: 141 mmol/L (ref 134–144)
Total Protein: 5.8 g/dL — ABNORMAL LOW (ref 6.0–8.5)
eGFR: 63 mL/min/{1.73_m2} (ref >59)

## 2021-07-23 LAB — CBC WITH DIFFERENTIAL/PLATELET
Basophils Absolute: 0.1 10*3/uL (ref 0.0–0.2)
Basos: 2 %
EOS (ABSOLUTE): 0.2 10*3/uL (ref 0.0–0.4)
Eos: 3 %
Hematocrit: 30.7 % — ABNORMAL LOW (ref 37.5–51.0)
Hemoglobin: 10.5 g/dL — ABNORMAL LOW (ref 13.0–17.7)
Immature Grans (Abs): 0.2 10*3/uL — ABNORMAL HIGH (ref 0.0–0.1)
Immature Granulocytes: 3 %
Lymphocytes Absolute: 0.9 10*3/uL (ref 0.7–3.1)
Lymphs: 13 %
MCH: 34.2 pg — ABNORMAL HIGH (ref 26.6–33.0)
MCHC: 34.2 g/dL (ref 31.5–35.7)
MCV: 100 fL — ABNORMAL HIGH (ref 79–97)
Monocytes Absolute: 1.9 10*3/uL — ABNORMAL HIGH (ref 0.1–0.9)
Monocytes: 30 %
Neutrophils Absolute: 3.3 10*3/uL (ref 1.4–7.0)
Neutrophils: 49 %
Platelets: 497 10*3/uL — ABNORMAL HIGH (ref 150–450)
RBC: 3.07 x10E6/uL — ABNORMAL LOW (ref 4.14–5.80)
RDW: 18 % — ABNORMAL HIGH (ref 11.6–15.4)
WBC: 6.5 10*3/uL (ref 3.4–10.8)

## 2021-07-23 LAB — CBC WITH DIFFERENTIAL (CANCER CENTER ONLY)
Abs Immature Granulocytes: 0.17 10*3/uL — ABNORMAL HIGH (ref 0.00–0.07)
Basophils Absolute: 0.1 10*3/uL (ref 0.0–0.1)
Basophils Relative: 2 %
Eosinophils Absolute: 0.2 10*3/uL (ref 0.0–0.5)
Eosinophils Relative: 2 %
HCT: 33.9 % — ABNORMAL LOW (ref 39.0–52.0)
Hemoglobin: 10.6 g/dL — ABNORMAL LOW (ref 13.0–17.0)
Immature Granulocytes: 2 %
Lymphocytes Relative: 13 %
Lymphs Abs: 1 10*3/uL (ref 0.7–4.0)
MCH: 32.8 pg (ref 26.0–34.0)
MCHC: 31.3 g/dL (ref 30.0–36.0)
MCV: 105 fL — ABNORMAL HIGH (ref 80.0–100.0)
Monocytes Absolute: 2.1 10*3/uL — ABNORMAL HIGH (ref 0.1–1.0)
Monocytes Relative: 28 %
Neutro Abs: 4.1 10*3/uL (ref 1.7–7.7)
Neutrophils Relative %: 53 %
Platelet Count: 484 10*3/uL — ABNORMAL HIGH (ref 150–400)
RBC: 3.23 MIL/uL — ABNORMAL LOW (ref 4.22–5.81)
RDW: 19.9 % — ABNORMAL HIGH (ref 11.5–15.5)
WBC Count: 7.6 10*3/uL (ref 4.0–10.5)
nRBC: 0 % (ref 0.0–0.2)

## 2021-07-23 LAB — CMP (CANCER CENTER ONLY)
ALT: 24 U/L (ref 0–44)
AST: 33 U/L (ref 15–41)
Albumin: 2.6 g/dL — ABNORMAL LOW (ref 3.5–5.0)
Alkaline Phosphatase: 198 U/L — ABNORMAL HIGH (ref 38–126)
Anion gap: 11 (ref 5–15)
BUN: 13 mg/dL (ref 8–23)
CO2: 25 mmol/L (ref 22–32)
Calcium: 9 mg/dL (ref 8.9–10.3)
Chloride: 107 mmol/L (ref 98–111)
Creatinine: 1.22 mg/dL (ref 0.61–1.24)
GFR, Estimated: >60 mL/min (ref >60)
Glucose, Bld: 96 mg/dL (ref 70–99)
Potassium: 3.7 mmol/L (ref 3.5–5.1)
Sodium: 143 mmol/L (ref 135–145)
Total Bilirubin: 0.5 mg/dL (ref 0.3–1.2)
Total Protein: 6.4 g/dL — ABNORMAL LOW (ref 6.5–8.1)

## 2021-07-23 LAB — MAGNESIUM
Magnesium: 2 mg/dL (ref 1.6–2.3)
Magnesium: 1.9 mg/dL (ref 1.7–2.4)

## 2021-07-23 MED ORDER — HEPARIN SOD (PORK) LOCK FLUSH 100 UNIT/ML IV SOLN: 500.0000 [IU] | Freq: Once | INTRAVENOUS | Status: DC

## 2021-07-23 MED ORDER — SODIUM CHLORIDE 0.9% FLUSH: 10.0000 mL | INTRAVENOUS | Status: DC | PRN

## 2021-07-23 NOTE — Progress Notes (Signed)
Per Dr. Lorenso Courier - patient will not receive chemotherapy today. Patient verbalized understanding of MD plans for today and asked that his wife be contacted. Contacted patient wife - she will come pick him up now. Patient transported to [place] in wheelchair to wait for wife. Patient A&O x 3, in no distress.

## 2021-07-23 NOTE — Telephone Encounter (Signed)
Patient spouse contacted - injection appt for [facility] on Saturday 10/22 to be cancelled as patient did not receive chemotherapy today. She verbalized understanding. Ms. Imran asked when next chemo would be schedule since today's appt was cancelled. Patient does not have any further appts.  Call information/question routed to Dr. Lorenso Courier

## 2021-07-23 NOTE — Progress Notes (Signed)
[place] [phone]   [phone]  PROGRESS NOTE  Patient Care Team: [name], MD as PCP - General (Family Medicine) Jacolyn Reedy, MD as Consulting Physician (Cardiology) Orson Slick, MD as Consulting Physician (Oncology) Royston Bake, RN as Oncology Nurse Navigator (Oncology)  Hematological/Oncological History # Pancreatic Adenocarcinoma, Metastatic 03/31/2021: patient underwent routine CT angio chest for monitoring of his thoracic aortic aneurysm. Found to have enlargement of left supraclavicular lymph node with diffuse mediastinal and upper abdominal lymph nodes. 05/01/2021: [place] core biopsy of left supraclavicular lymph node. Findings consistent with metastatic adenocarcinoma. Lung vs pancreatic origin 05/06/2021: CT abdomen performed. Formal read pending. 05/07/2021: establish care with Dr. Lorenso Courier  05/21/2021: Cycle 1 Day 1 of Gem/Abraxane 05/28/2021: Cycle 1 Day 8 HELD due to cytopenias and poor renal function 06/12/2021: Cycle 1 Day 22 of Gem/Abraxane (extra week due to missed week on 05/28/2021) 06/25/2021: Cycle 2 Day 1 of Gem/Abraxane 07/12/2021-07/17/2021: Admitted to the hospital for weakness and deconditioning.  Interval History:  Lee Kim 75 y.o. male with medical history significant for metastatic adenocarcinoma of the pancreas who presents for a follow up visit. The patient's last visit was on 06/25/2021. In the interim since the last visit he was admitted to the hospital from 07/12/2021 until 07/17/2021 with deconditioning.  On exam today Mr. Littler reports he continues to be very fatigued.  He reports he is quite tired ever since he was discharged from the hospital.  He notes he is "always cold".  He spends most of his time sitting or in bed and pretty much only uses his walker to walk back and forth to the bathroom.  He does endorse having to urinate a lot.  Overall he just feels wiped out".  He reports that he has  been eating all right and denies any nausea, vomiting, or diarrhea.  He denies any fevers, chills, sweats.  He does endorse having shortness of breath on occasion but no chest pain or pressure.   A  10 point ROS is listed below.   MEDICAL HISTORY:  Past Medical History:  Diagnosis Date   Colonic polyp    Diverticulitis    Erythrocytosis    Hypertension    Obesity    OSA (obstructive sleep apnea)     SURGICAL HISTORY: Past Surgical History:  Procedure Laterality Date   COLONOSCOPY  03/2003   Dr. Wynetta Emery   COLONOSCOPY WITH PROPOFOL N/A 11/23/2016   Procedure: COLONOSCOPY WITH PROPOFOL;  Surgeon: Garlan Fair, MD;  Location: [facility];  Service: Endoscopy;  Laterality: N/A;   EYE SURGERY     IR IMAGING GUIDED PORT INSERTION  05/15/2021    SOCIAL HISTORY: Social History   Socioeconomic History   Marital status: Married    Spouse name: Not on file   Number of children: 1   Years of education: Not on file   Highest education level: Not on file  Occupational History   Not on file  Tobacco Use   Smoking status: Every Day    Types: Cigarettes   Smokeless tobacco: Never  Vaping Use   Vaping Use: Never used  Substance and Sexual Activity   Alcohol use: Yes    Alcohol/week: 7.0 standard drinks    Types: 7 Glasses of wine per week    Comment: 3-4 glasses wine per night   Drug use: No   Sexual activity: Yes  Other Topics Concern   Not on file  Social History Narrative  Not on file   Social Determinants of Health   Financial Resource Strain: Not on file  Food Insecurity: Not on file  Transportation Needs: Not on file  Physical Activity: Not on file  Stress: Not on file  Social Connections: Not on file  Intimate Partner Violence: Not on file    FAMILY HISTORY: Family History  Problem Relation Age of Onset   Hypertension Mother    Hypertension Father    Hypertension Sister     ALLERGIES:  is allergic to benzalkonium chloride.  MEDICATIONS:  Current  Outpatient Medications  Medication Sig Dispense Refill   amLODipine (NORVASC) 10 MG tablet TAKE 1 TABLET(10 MG) BY MOUTH DAILY (Patient taking differently: Take 10 mg by mouth daily.) 90 tablet 0   cromolyn (NASALCROM) 5.2 MG/ACT nasal spray Place 1 spray into both nostrils 2 (two) times daily as needed for allergies. 26 mL 0   ELIQUIS 5 MG TABS tablet TAKE 1 TABLET(5 MG) BY MOUTH TWICE DAILY (Patient taking differently: Take 5 mg by mouth 2 (two) times daily.) 180 tablet 0   Gemcitabine HCl (GEMZAR IV) Inject 1 Bag into the vein once a week. Pt gets for 30 minutes once per week     lidocaine-prilocaine (EMLA) cream Apply 1 application topically as needed. 30 g 0   loperamide (IMODIUM) 2 MG capsule Take 4 mg by mouth as needed for diarrhea or loose stools.     Multiple Vitamins-Minerals (PRESERVISION AREDS 2 PO) Take 1 tablet by mouth 2 (two) times daily.      ondansetron (ZOFRAN) 8 MG tablet TAKE 1 TABLET(8 MG) BY MOUTH EVERY 8 HOURS AS NEEDED FOR NAUSEA OR VOMITING 30 tablet 0   PACLitaxel Protein-Bound Part (ABRAXANE IV) Inject 1 Bag into the vein once a week. Pt gets for 30 minutes once per week     prochlorperazine (COMPAZINE) 10 MG tablet TAKE 1 TABLET(10 MG) BY MOUTH EVERY 6 HOURS AS NEEDED FOR NAUSEA OR VOMITING (Patient taking differently: Take 10 mg by mouth every 6 (six) hours as needed for nausea, vomiting or refractory nausea / vomiting.) 30 tablet 0   tamsulosin (FLOMAX) 0.4 MG CAPS capsule Take 1 capsule (0.4 mg total) by mouth daily after supper. 90 capsule 1   TRAVATAN Z 0.004 % SOLN ophthalmic solution Place 1 drop into the right eye at bedtime. [place]  12   triamcinolone (KENALOG) 0.1 % APPLY EXTERNALLY TO THE AFFECTED AREA TWICE DAILY (Patient taking differently: Apply 1 application topically daily as needed (psoriasis).) 45 g 5   Current Facility-Administered Medications  Medication Dose Route Frequency Provider Last Rate Last Admin   heparin lock flush 100 unit/mL  500  Units Intravenous Once Ledell Peoples IV, MD       sodium chloride flush (NS) 0.9 % injection 10 mL  10 mL Intravenous PRN Orson Slick, MD        REVIEW OF SYSTEMS:   Constitutional: ( - ) fevers, ( - )  chills , ( - ) night sweats Eyes: ( - ) blurriness of vision, ( - ) double vision, ( - ) watery eyes Ears, nose, mouth, throat, and face: ( - ) mucositis, ( - ) sore throat Respiratory: ( - ) cough, ( - ) dyspnea, ( - ) wheezes Cardiovascular: ( - ) palpitation, ( - ) chest discomfort, ( - ) lower extremity swelling Gastrointestinal:  ( - ) nausea, ( - ) heartburn, ( - ) change in bowel habits Skin: ( - )  abnormal skin rashes Lymphatics: ( - ) new lymphadenopathy, ( - ) easy bruising Neurological: ( - ) numbness, ( - ) tingling, ( - ) new weaknesses Behavioral/Psych: ( - ) mood change, ( - ) new changes  All other systems were reviewed with the patient and are negative.  PHYSICAL EXAMINATION: ECOG PERFORMANCE STATUS: 1 - Symptomatic but completely ambulatory  Vitals:   07/23/21 1108  BP: 131/78  Pulse: 95  Resp: 16  Temp: 97.8 F (36.6 C)  SpO2: 100%      Filed Weights      GENERAL: Well-appearing elderly Caucasian male, alert, no distress and comfortable SKIN: skin color, texture, turgor are normal, no rashes or significant lesions EYES: conjunctiva are pink and non-injected, sclera clear LUNGS: clear to auscultation and percussion with normal breathing effort HEART: regular rate & rhythm and no murmurs and no lower extremity edema Musculoskeletal: no cyanosis of digits and no clubbing  PSYCH: alert & oriented x 3, fluent speech NEURO: no focal motor/sensory deficits  LABORATORY DATA:  I have reviewed the data as listed CBC Latest Ref Rng & Units 07/23/2021 07/22/2021 07/17/2021  WBC 4.0 - 10.5 K/uL 7.6 6.5 6.0  Hemoglobin 13.0 - 17.0 g/dL 10.6(L) 10.5(L) 8.5(L)  Hematocrit 39.0 - 52.0 % 33.9(L) 30.7(L) 26.3(L)  Platelets 150 - 400 K/uL 484(H) 497(H) 333     CMP Latest Ref Rng & Units 07/23/2021 07/22/2021 07/17/2021  Glucose 70 - 99 mg/dL 96 98 119(H)  BUN 8 - 23 mg/dL '13 12 12  ' Creatinine 0.61 - 1.24 mg/dL 1.22 1.21 1.21  Sodium 135 - 145 mmol/L 143 141 140  Potassium 3.5 - 5.1 mmol/L 3.7 4.4 3.9  Chloride 98 - 111 mmol/L 107 103 109  CO2 22 - 32 mmol/L '25 21 24  ' Calcium 8.9 - 10.3 mg/dL 9.0 8.7 8.3(L)  Total Protein 6.5 - 8.1 g/dL 6.4(L) 5.8(L) 4.9(L)  Total Bilirubin 0.3 - 1.2 mg/dL 0.5 0.4 0.6  Alkaline Phos 38 - 126 U/L 198(H) 210(H) 162(H)  AST 15 - 41 U/L 33 33 57(H)  ALT 0 - 44 U/L 24 24 44    RADIOGRAPHIC STUDIES: I have personally reviewed the radiological images as listed and agreed with the findings in the report. CT HEAD WO CONTRAST (5MM)  Result Date: 07/12/2021 CLINICAL DATA:  75 year old male EXAM: CT HEAD WITHOUT CONTRAST TECHNIQUE: Contiguous axial images were obtained from the base of the skull through the vertex without intravenous contrast. COMPARISON:  MR 05/21/2021 FINDINGS: Brain: No acute intracranial hemorrhage. No midline shift. Encephalomalacia of the right temporoparietal region, as demonstrated on the prior MRI. Focal hypodensity in the left frontal cortex, corresponding to the diffusion abnormality on prior MRI. No unexpected or new regions of loss of the gray-white differential. Unremarkable appearance of the met trickles. Vascular: Intracranial atherosclerosis of anterior and posterior circulation. Skull: No acute fracture.  No aggressive bone lesion identified. Sinuses/Orbits: Unremarkable appearance of the orbits. Mastoid air cells clear. No middle ear effusion. No significant sinus disease. Other: None IMPRESSION: No acute intracranial abnormality. 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 Encephalomalacia of remote right temporoparietal infarct. Intracranial atherosclerosis. Electronically Signed   By: Corrie Mckusick D.O.   On: 07/12/2021 13:21   US  RENAL  Result Date: 07/12/2021 CLINICAL DATA:  Acute renal insufficiency. EXAM: RENAL / URINARY TRACT ULTRASOUND COMPLETE COMPARISON:  Abdominal CT May 06, 2021 FINDINGS: Right Kidney: Renal measurements: 13.2 x 5.6 x 4.6 cm = volume: 177.4  mL. Echogenicity within normal limits. No hydronephrosis visualized. Two benign-appearing cysts in the right kidney measure 1.6 and 0.9 cm. Left Kidney: Renal measurements: 10.8 x 4.2 x 4.2 cm = volume: 99.3 mL. Echogenicity within normal limits. No hydronephrosis visualized. Two benign-appearing cysts in the left kidney measuring 1.6 and 1.7 cm. Bladder: Decompressed and therefore poorly evaluated. Other: None. IMPRESSION: Bilateral renal cysts, otherwise normal appearance of the kidneys. Electronically Signed   By: Fidela Salisbury M.D.   On: 07/12/2021 17:27   DG Chest Portable 1 View  Result Date: 07/12/2021 CLINICAL DATA:  Shortness of breath. EXAM: PORTABLE CHEST 1 VIEW COMPARISON:  CT angiogram March 31, 2021 FINDINGS: The mediastinal contour is normal. Right central venous line is identified with distal tip in the superior vena cava. Mild cardiomegaly is noted. The lungs are clear. The visualized skeletal structures are unremarkable. IMPRESSION: No acute cardiopulmonary disease identified. Electronically Signed   By: Abelardo Diesel M.D.   On: 07/12/2021 12:23   DG Abd Portable 1V  Result Date: 07/13/2021 CLINICAL DATA:  Abdominal distension, pain, pancreatic cancer EXAM: PORTABLE ABDOMEN - 1 VIEW COMPARISON:  05/06/2021 FINDINGS: Supine frontal view of the abdomen and pelvis excludes the bilateral flanks and hemidiaphragms by collimation. Bowel gas pattern is unremarkable without obstruction or ileus. No abdominal masses or abnormal calcifications. No acute bony abnormalities. IMPRESSION: 1. Unremarkable bowel gas pattern. Electronically Signed   By: Randa Ngo M.D.   On: 07/13/2021 19:06    ASSESSMENT & PLAN Lee Kim 75 y.o. male with  medical history significant for metastatic adenocarcinoma of the pancreas who presents for a follow up visit.  After review of the labs, review of the records, and discussion with the patient the patients findings are most consistent with metastatic adenocarcinoma the pancreas.  This is based on the pathological findings and the distribution of the lymph node involvement.  Tumor markers ordered on 05/07/2021 support the diagnosis of pancreatic primary.  Lung adenocarcinoma was on the differential but at this time without a clear lung primary I would favor metastatic pancreatic cancer.   At this time would recommend pursuing port placement with consideration of gemcitabine and Abraxane.  This regimen would consist of gemcitabine 1000 mg per metered squared IV on days 1, 8, 15 and albumin-bound paclitaxel 125 mg per metered squared on days 1, 8, 15 of a 28-day cycle.  This would be continued until intolerance or progression.  # Metastatic Adenocarcinoma of the Pancreas -- Findings at this time are most consistent with metastatic adenocarcinoma the pancreas.   -- Tumor markers collected on 05/07/2021 show an AFP of 2.7, CA 19-9 of 1870, and a CEA of 75.47 --Regimen of choice this time would be gemcitabine and Abraxane --We will order BRCA 1 and 2 mutational status to see if there are options for further therapy --Cycle 1 Day 1 of Gem/Abraxane on 05/21/2021 Plan:  --HOLD chemotherapy today due to continued weakness and declining functional status. --will transition to monotherapy gemcitabine if he is able to tolerate restart of chemotherapy.  --RTC in 2 weeks to consider restarting therapy.  #Anemia, chemotherapy induced --Hgb 10.6, improved from prior  --continue to monitor   #Supportive Care -- chemotherapy education complete -- port placed -- zofran 32m q8H PRN and compazine 141mPO q6H for nausea -- EMLA cream for port -- no pain medication required at this time.   No orders of the defined  types were placed in this encounter.   All questions were answered.  The patient knows to call the clinic with any problems, questions or concerns.  A total of more than 30 minutes were spent on this encounter with face-to-face time and non-face-to-face time, including preparing to see the patient, ordering tests and/or medications, counseling the patient and coordination of care as outlined above.   Ledell Peoples, MD Department of Hematology/Oncology [place] at [facility] Phone: [phone] Pager: [phone] Email: [email or web address]  07/23/2021 12:51 PM

## 2021-07-24 ENCOUNTER — Telehealth: Payer: Self-pay

## 2021-07-24 ENCOUNTER — Other Ambulatory Visit: Payer: Self-pay

## 2021-07-24 DIAGNOSIS — R531 Weakness: Secondary | ICD-10-CM

## 2021-07-24 DIAGNOSIS — R590 Localized enlarged lymph nodes: Secondary | ICD-10-CM

## 2021-07-24 NOTE — Telephone Encounter (Signed)
Christine from [facility] home health to advise that pt has fallen today. No injury to report. She has requested a verbal for home health to eval for PT soon. Verbal was given per DR. Lalonde. Ok was also given for beside commode. Per Dr. Redmond School request a U/A be done with call report. Order will be faxed over to them to have this done. Beside commode order will be faxed to [facility]. [place]

## 2021-07-24 NOTE — Telephone Encounter (Signed)
Pt wife called and advise that he has had frequent urination all night long . Pt advised even when he is done he still as if he need to go. Pt has AZO and  would like to know if he can take this or can you call something in. [facility]

## 2021-07-24 NOTE — Telephone Encounter (Signed)
Lee Kim a nurse with [facility] called just wanted to let you know they have been trying to get a UA from Mr. Lee Kim but he couldn't go and they will try again on Monday. She also said they were going to give him AZO to take and they checked with the pharmacy and it doesn't interact  with any of his medications. She can be reached at [phone].

## 2021-07-25 ENCOUNTER — Inpatient Hospital Stay: Payer: PPO

## 2021-07-27 DIAGNOSIS — R350 Frequency of micturition: Secondary | ICD-10-CM | POA: Diagnosis not present

## 2021-07-29 ENCOUNTER — Telehealth: Payer: Self-pay

## 2021-07-29 NOTE — Telephone Encounter (Signed)
Tried to get in touch with Donzetta Starch to advise on pt DME order request for pt semi electric bed. I was told that she would be sent email to call back with methide of use and pt position instruction to add on the order. Also pt had U/A done last week and we did not get the results . [place]

## 2021-07-31 ENCOUNTER — Telehealth: Payer: Self-pay

## 2021-07-31 NOTE — Telephone Encounter (Signed)
Pt wife was advised of U/A results taken by the in home nurse. [place]

## 2021-08-02 DIAGNOSIS — G4733 Obstructive sleep apnea (adult) (pediatric): Secondary | ICD-10-CM | POA: Diagnosis not present

## 2021-08-03 DIAGNOSIS — N179 Acute kidney failure, unspecified: Secondary | ICD-10-CM | POA: Diagnosis not present

## 2021-08-03 DIAGNOSIS — G4733 Obstructive sleep apnea (adult) (pediatric): Secondary | ICD-10-CM | POA: Diagnosis not present

## 2021-08-03 DIAGNOSIS — A419 Sepsis, unspecified organism: Secondary | ICD-10-CM | POA: Diagnosis not present

## 2021-08-04 DIAGNOSIS — I110 Hypertensive heart disease with heart failure: Secondary | ICD-10-CM | POA: Diagnosis not present

## 2021-08-04 DIAGNOSIS — Q6102 Congenital multiple renal cysts: Secondary | ICD-10-CM | POA: Diagnosis not present

## 2021-08-04 DIAGNOSIS — I480 Paroxysmal atrial fibrillation: Secondary | ICD-10-CM | POA: Diagnosis not present

## 2021-08-04 DIAGNOSIS — Z7901 Long term (current) use of anticoagulants: Secondary | ICD-10-CM | POA: Diagnosis not present

## 2021-08-04 DIAGNOSIS — Z79899 Other long term (current) drug therapy: Secondary | ICD-10-CM | POA: Diagnosis not present

## 2021-08-04 DIAGNOSIS — G4733 Obstructive sleep apnea (adult) (pediatric): Secondary | ICD-10-CM | POA: Diagnosis not present

## 2021-08-04 DIAGNOSIS — Z6823 Body mass index (BMI) 23.0-23.9, adult: Secondary | ICD-10-CM | POA: Diagnosis not present

## 2021-08-04 DIAGNOSIS — Z9181 History of falling: Secondary | ICD-10-CM | POA: Diagnosis not present

## 2021-08-04 DIAGNOSIS — Z792 Long term (current) use of antibiotics: Secondary | ICD-10-CM | POA: Diagnosis not present

## 2021-08-04 DIAGNOSIS — Z452 Encounter for adjustment and management of vascular access device: Secondary | ICD-10-CM | POA: Diagnosis not present

## 2021-08-04 DIAGNOSIS — N390 Urinary tract infection, site not specified: Secondary | ICD-10-CM | POA: Diagnosis not present

## 2021-08-04 DIAGNOSIS — N401 Enlarged prostate with lower urinary tract symptoms: Secondary | ICD-10-CM | POA: Diagnosis not present

## 2021-08-04 DIAGNOSIS — R338 Other retention of urine: Secondary | ICD-10-CM | POA: Diagnosis not present

## 2021-08-04 DIAGNOSIS — I5022 Chronic systolic (congestive) heart failure: Secondary | ICD-10-CM | POA: Diagnosis not present

## 2021-08-04 DIAGNOSIS — A412 Sepsis due to unspecified staphylococcus: Secondary | ICD-10-CM | POA: Diagnosis not present

## 2021-08-04 DIAGNOSIS — E440 Moderate protein-calorie malnutrition: Secondary | ICD-10-CM | POA: Diagnosis not present

## 2021-08-04 DIAGNOSIS — E669 Obesity, unspecified: Secondary | ICD-10-CM | POA: Diagnosis not present

## 2021-08-04 DIAGNOSIS — C259 Malignant neoplasm of pancreas, unspecified: Secondary | ICD-10-CM | POA: Diagnosis not present

## 2021-08-04 DIAGNOSIS — D630 Anemia in neoplastic disease: Secondary | ICD-10-CM | POA: Diagnosis not present

## 2021-08-05 ENCOUNTER — Inpatient Hospital Stay: Payer: PPO | Attending: Hematology and Oncology

## 2021-08-05 ENCOUNTER — Inpatient Hospital Stay: Payer: PPO

## 2021-08-05 ENCOUNTER — Inpatient Hospital Stay: Payer: PPO | Admitting: Hematology and Oncology

## 2021-08-05 DIAGNOSIS — R42 Dizziness and giddiness: Secondary | ICD-10-CM | POA: Insufficient documentation

## 2021-08-05 DIAGNOSIS — Z79899 Other long term (current) drug therapy: Secondary | ICD-10-CM | POA: Insufficient documentation

## 2021-08-05 DIAGNOSIS — F17210 Nicotine dependence, cigarettes, uncomplicated: Secondary | ICD-10-CM | POA: Insufficient documentation

## 2021-08-05 DIAGNOSIS — I959 Hypotension, unspecified: Secondary | ICD-10-CM | POA: Insufficient documentation

## 2021-08-05 DIAGNOSIS — C259 Malignant neoplasm of pancreas, unspecified: Secondary | ICD-10-CM | POA: Insufficient documentation

## 2021-08-05 DIAGNOSIS — D6481 Anemia due to antineoplastic chemotherapy: Secondary | ICD-10-CM | POA: Insufficient documentation

## 2021-08-05 DIAGNOSIS — I10 Essential (primary) hypertension: Secondary | ICD-10-CM | POA: Insufficient documentation

## 2021-08-05 DIAGNOSIS — Z7901 Long term (current) use of anticoagulants: Secondary | ICD-10-CM | POA: Insufficient documentation

## 2021-08-05 DIAGNOSIS — T451X5A Adverse effect of antineoplastic and immunosuppressive drugs, initial encounter: Secondary | ICD-10-CM | POA: Insufficient documentation

## 2021-08-05 DIAGNOSIS — C772 Secondary and unspecified malignant neoplasm of intra-abdominal lymph nodes: Secondary | ICD-10-CM | POA: Insufficient documentation

## 2021-08-06 ENCOUNTER — Telehealth: Payer: Self-pay | Admitting: *Deleted

## 2021-08-06 ENCOUNTER — Telehealth: Payer: Self-pay | Admitting: Family Medicine

## 2021-08-06 NOTE — Telephone Encounter (Signed)
TCT pt's wife regarding an appt he missed yesterday, 08/05/21 Spoke with wife. She states she did not know he had an appt  yesterday. She states he is very weak and does not know if he wants to continue his chemotherapy. Advised that he he to , at least, follow up with Dr. Lorenso Courier and have his labs checked. They are agreeable to this and will come at next scheduled appt on 08/13/21

## 2021-08-06 NOTE — Telephone Encounter (Signed)
Lee Kim with [facility] home health called. He is Occupational therapist assigned to him. He states that went to see pt today and pt refused to see him. He would not get out of bed stating he had hemorroids. He also cancelled PT. Pt has refused to see him before and [name] is concerned that pt may be falling behind. [name] may be reached at [phone].

## 2021-08-07 ENCOUNTER — Telehealth: Payer: Self-pay | Admitting: Hematology and Oncology

## 2021-08-07 NOTE — Telephone Encounter (Signed)
Sch per 1/13 inbasket, pt aware °

## 2021-08-10 NOTE — Telephone Encounter (Signed)
LVM advising [place]

## 2021-08-13 ENCOUNTER — Other Ambulatory Visit: Payer: PPO

## 2021-08-13 ENCOUNTER — Telehealth: Payer: Self-pay | Admitting: *Deleted

## 2021-08-13 ENCOUNTER — Inpatient Hospital Stay: Payer: PPO | Admitting: Hematology and Oncology

## 2021-08-13 ENCOUNTER — Inpatient Hospital Stay: Payer: PPO

## 2021-08-13 NOTE — Telephone Encounter (Signed)
Received call from pt's wife stating that Lee Kim is too weak to come in today. He stays in the bed most of the time. He is able to get to the bathroom ok with assistance. He is eating and drinking without any problems. Discussed with Dr. Lorenso Courier. He recommended moving appt out another 2 weeks or if pt does not want to continue treatments, we can refer him to for Hospice care. Discussed these options with her and she will talk to the pt and call me back. Emotional support extended.

## 2021-08-14 ENCOUNTER — Telehealth: Payer: Self-pay | Admitting: *Deleted

## 2021-08-14 ENCOUNTER — Encounter: Payer: Self-pay | Admitting: General Practice

## 2021-08-14 NOTE — Progress Notes (Signed)
[facility] CSW Progress Notes  Call from wife - she has been told by [facility] PT (Christine Lockney [phone])  provider that [facility] provides transportation and she would like this help Called wife to explore his needs - per wife, he is having frequent falls/weakness.  Family does not have wheelchair or ramp.  HH PT is working on increasing his physical stamina; however, patient is in bed most of the day and is not making significant progress.  Per [facility] PT, his weakness/fatigue is significant and he may need stretcher transport in order to get into/out of house safely.  At this point, he is a high fall risk AEB frequent falls in the recent past and minimal/no progress despite [facility].  He missed his last [facility] appointment due to a fall while getting out of house and into vehicle.  Spoke w [employer] - with a transportation request, they will reach out to their vendors and determine if they have a vendor able to provide stretcher transport.  Also provided information on the [employer] non emergent transportation request/approval process to [place].  Also advised that wife will need to speak directly with insurance company to determine any copay or patient responsibility under their insurance coverage.  Edwyna Shell, LCSW Clinical Social Worker Phone:  [phone]

## 2021-08-14 NOTE — Telephone Encounter (Signed)
Several calls today between this facility and pt's wife and son regarding getting pt here for his visit next week with Dr. Lorenso Courier. Ultimately the son has arranged a private pay home health agency to come and re-evaluate pt for his mobility and also have some one help get him ready for his appt here next week and help get him in the car. Advised Mrs. Badal that if her husband is essentially bedbound and too weak to do much of anything, then he is likely too weak for continuing chemotherapy.  Dr. Lorenso Courier does want to see him and evaluate his overall condition and talk to them both about future plans. Mrs. Keesling voiced understanding. Advised to call next week should anything change with their current plan

## 2021-08-18 DIAGNOSIS — G4733 Obstructive sleep apnea (adult) (pediatric): Secondary | ICD-10-CM | POA: Diagnosis not present

## 2021-08-18 DIAGNOSIS — Q6102 Congenital multiple renal cysts: Secondary | ICD-10-CM | POA: Diagnosis not present

## 2021-08-18 DIAGNOSIS — C259 Malignant neoplasm of pancreas, unspecified: Secondary | ICD-10-CM | POA: Diagnosis not present

## 2021-08-18 DIAGNOSIS — D630 Anemia in neoplastic disease: Secondary | ICD-10-CM | POA: Diagnosis not present

## 2021-08-18 DIAGNOSIS — A412 Sepsis due to unspecified staphylococcus: Secondary | ICD-10-CM | POA: Diagnosis not present

## 2021-08-18 DIAGNOSIS — E669 Obesity, unspecified: Secondary | ICD-10-CM | POA: Diagnosis not present

## 2021-08-18 DIAGNOSIS — Z7901 Long term (current) use of anticoagulants: Secondary | ICD-10-CM | POA: Diagnosis not present

## 2021-08-18 DIAGNOSIS — Z792 Long term (current) use of antibiotics: Secondary | ICD-10-CM | POA: Diagnosis not present

## 2021-08-18 DIAGNOSIS — I110 Hypertensive heart disease with heart failure: Secondary | ICD-10-CM | POA: Diagnosis not present

## 2021-08-18 DIAGNOSIS — Z79899 Other long term (current) drug therapy: Secondary | ICD-10-CM | POA: Diagnosis not present

## 2021-08-18 DIAGNOSIS — N401 Enlarged prostate with lower urinary tract symptoms: Secondary | ICD-10-CM | POA: Diagnosis not present

## 2021-08-18 DIAGNOSIS — Z6823 Body mass index (BMI) 23.0-23.9, adult: Secondary | ICD-10-CM | POA: Diagnosis not present

## 2021-08-18 DIAGNOSIS — I5022 Chronic systolic (congestive) heart failure: Secondary | ICD-10-CM | POA: Diagnosis not present

## 2021-08-18 DIAGNOSIS — Z452 Encounter for adjustment and management of vascular access device: Secondary | ICD-10-CM | POA: Diagnosis not present

## 2021-08-18 DIAGNOSIS — R338 Other retention of urine: Secondary | ICD-10-CM | POA: Diagnosis not present

## 2021-08-18 DIAGNOSIS — Z9181 History of falling: Secondary | ICD-10-CM | POA: Diagnosis not present

## 2021-08-18 DIAGNOSIS — E440 Moderate protein-calorie malnutrition: Secondary | ICD-10-CM | POA: Diagnosis not present

## 2021-08-18 DIAGNOSIS — N390 Urinary tract infection, site not specified: Secondary | ICD-10-CM | POA: Diagnosis not present

## 2021-08-18 DIAGNOSIS — I480 Paroxysmal atrial fibrillation: Secondary | ICD-10-CM | POA: Diagnosis not present

## 2021-08-21 ENCOUNTER — Inpatient Hospital Stay: Payer: PPO

## 2021-08-21 ENCOUNTER — Inpatient Hospital Stay (HOSPITAL_BASED_OUTPATIENT_CLINIC_OR_DEPARTMENT_OTHER): Payer: PPO

## 2021-08-21 ENCOUNTER — Other Ambulatory Visit: Payer: Self-pay

## 2021-08-21 ENCOUNTER — Ambulatory Visit: Payer: PPO

## 2021-08-21 ENCOUNTER — Inpatient Hospital Stay: Payer: PPO | Admitting: Physician Assistant

## 2021-08-21 VITALS — BP 93/62 | HR 76 | Temp 97.8°F | Resp 18 | Ht 73.0 in | Wt 205.0 lb

## 2021-08-21 DIAGNOSIS — Z79899 Other long term (current) drug therapy: Secondary | ICD-10-CM | POA: Diagnosis not present

## 2021-08-21 DIAGNOSIS — T451X5A Adverse effect of antineoplastic and immunosuppressive drugs, initial encounter: Secondary | ICD-10-CM | POA: Diagnosis not present

## 2021-08-21 DIAGNOSIS — Z7901 Long term (current) use of anticoagulants: Secondary | ICD-10-CM | POA: Diagnosis not present

## 2021-08-21 DIAGNOSIS — F17210 Nicotine dependence, cigarettes, uncomplicated: Secondary | ICD-10-CM | POA: Diagnosis not present

## 2021-08-21 DIAGNOSIS — C259 Malignant neoplasm of pancreas, unspecified: Secondary | ICD-10-CM

## 2021-08-21 DIAGNOSIS — I10 Essential (primary) hypertension: Secondary | ICD-10-CM | POA: Diagnosis not present

## 2021-08-21 DIAGNOSIS — C772 Secondary and unspecified malignant neoplasm of intra-abdominal lymph nodes: Secondary | ICD-10-CM | POA: Diagnosis not present

## 2021-08-21 DIAGNOSIS — I9589 Other hypotension: Secondary | ICD-10-CM | POA: Diagnosis not present

## 2021-08-21 DIAGNOSIS — I959 Hypotension, unspecified: Secondary | ICD-10-CM | POA: Diagnosis not present

## 2021-08-21 DIAGNOSIS — R42 Dizziness and giddiness: Secondary | ICD-10-CM | POA: Diagnosis not present

## 2021-08-21 DIAGNOSIS — D6481 Anemia due to antineoplastic chemotherapy: Secondary | ICD-10-CM | POA: Diagnosis not present

## 2021-08-21 LAB — CBC WITH DIFFERENTIAL (CANCER CENTER ONLY)
Abs Immature Granulocytes: 0.01 10*3/uL (ref 0.00–0.07)
Basophils Absolute: 0.1 10*3/uL (ref 0.0–0.1)
Basophils Relative: 2 %
Eosinophils Absolute: 0.3 10*3/uL (ref 0.0–0.5)
Eosinophils Relative: 6 %
HCT: 40.8 % (ref 39.0–52.0)
Hemoglobin: 13.5 g/dL (ref 13.0–17.0)
Immature Granulocytes: 0 %
Lymphocytes Relative: 20 %
Lymphs Abs: 1.2 10*3/uL (ref 0.7–4.0)
MCH: 32.5 pg (ref 26.0–34.0)
MCHC: 33.1 g/dL (ref 30.0–36.0)
MCV: 98.3 fL (ref 80.0–100.0)
Monocytes Absolute: 0.7 10*3/uL (ref 0.1–1.0)
Monocytes Relative: 12 %
Neutro Abs: 3.6 10*3/uL (ref 1.7–7.7)
Neutrophils Relative %: 60 %
Platelet Count: 153 10*3/uL (ref 150–400)
RBC: 4.15 MIL/uL — ABNORMAL LOW (ref 4.22–5.81)
RDW: 16.4 % — ABNORMAL HIGH (ref 11.5–15.5)
WBC Count: 5.8 10*3/uL (ref 4.0–10.5)
nRBC: 0 % (ref 0.0–0.2)

## 2021-08-21 LAB — CMP (CANCER CENTER ONLY)
ALT: 18 U/L (ref 0–44)
AST: 23 U/L (ref 15–41)
Albumin: 3.1 g/dL — ABNORMAL LOW (ref 3.5–5.0)
Alkaline Phosphatase: 135 U/L — ABNORMAL HIGH (ref 38–126)
Anion gap: 11 (ref 5–15)
BUN: 21 mg/dL (ref 8–23)
CO2: 23 mmol/L (ref 22–32)
Calcium: 9.4 mg/dL (ref 8.9–10.3)
Chloride: 107 mmol/L (ref 98–111)
Creatinine: 1.32 mg/dL — ABNORMAL HIGH (ref 0.61–1.24)
GFR, Estimated: 57 mL/min — ABNORMAL LOW (ref >60)
Glucose, Bld: 92 mg/dL (ref 70–99)
Potassium: 3.3 mmol/L — ABNORMAL LOW (ref 3.5–5.1)
Sodium: 141 mmol/L (ref 135–145)
Total Bilirubin: 0.7 mg/dL (ref 0.3–1.2)
Total Protein: 6.7 g/dL (ref 6.5–8.1)

## 2021-08-21 LAB — MAGNESIUM: Magnesium: 1.9 mg/dL (ref 1.7–2.4)

## 2021-08-21 MED ORDER — SODIUM CHLORIDE 0.9 % IV SOLN: INTRAVENOUS | Status: AC

## 2021-08-21 NOTE — Progress Notes (Signed)
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 Found to have enlargement of left supraclavicular lymph node with diffuse mediastinal and upper abdominal lymph nodes. 05/01/2021: [place] core biopsy of left supraclavicular lymph node. Findings consistent with metastatic adenocarcinoma. Lung vs pancreatic origin 05/06/2021: CT abdomen performed. Formal read pending. 05/07/2021: establish care with Dr. Lorenso Courier  05/21/2021: Cycle 1 Day 1 of Gem/Abraxane 05/28/2021: Cycle 1 Day 8 HELD due to cytopenias and poor renal function 06/12/2021: Cycle 1 Day 22 of Gem/Abraxane (extra week due to missed week on 05/28/2021) 06/25/2021: Cycle 2 Day 1 of Gem/Abraxane 07/12/2021-07/17/2021: Admitted to the hospital for weakness and deconditioning.  Interval History:  Lee Kim 75 y.o. male with medical history significant for metastatic adenocarcinoma of the pancreas who presents for a follow up visit. The patient's last visit was on 07/23/2021.   On exam today Lee Kim reports he continues to have low energy and is in bed or sitting for most of the day. He uses a walk to ambulate mainly to use the bathroom. His appetite is fairly stable. He tries to stay hydrated and drink lots of fluids. He had a couple of episodes of nausea without vomiting. He take prescribed antiemetics as needed. He denies any abdominal pain and add having daily bowel movements in the last couple of days. Patient's wife states she has noticed some blood tinged stools intermittently.  He denies any urinary symptoms. He reports feeling cold all the time. He is dizziness when he tries to ambulate. He had a fall last week when he tried to walk, he denies hitting his head. Patient denies any fevers, chills, night sweats, shortness of breath, chest pain or cough. He has no other complaints. A  10 point ROS is listed below.   MEDICAL HISTORY:  Past Medical History:  Diagnosis Date   Colonic polyp    Diverticulitis    Erythrocytosis    Hypertension    Obesity    OSA (obstructive sleep apnea)     SURGICAL HISTORY: Past Surgical History:  Procedure Laterality Date   COLONOSCOPY  03/2003   Dr. Wynetta Emery   COLONOSCOPY WITH PROPOFOL N/A 11/23/2016   Procedure: COLONOSCOPY WITH PROPOFOL;  Surgeon: Garlan Fair, MD;  Location: [facility] ENDOSCOPY;  Service: Endoscopy;  Laterality: N/A;   EYE SURGERY     IR IMAGING GUIDED PORT INSERTION  05/15/2021    SOCIAL HISTORY: Social History   Socioeconomic History   Marital status: Married    Spouse name: Not on file   Number of children: 1   Years of education: Not on file   Highest education level: Not on file  Occupational History   Not on file  Tobacco Use   Smoking status: Every Day    Types: Cigarettes   Smokeless tobacco: Never  Vaping Use   Vaping Use: Never used  Substance and Sexual Activity   Alcohol use: Yes    Alcohol/week: 7.0 standard drinks    Types: 7 Glasses of wine per week    Comment: 3-4 glasses wine per night   Drug use: No   Sexual activity: Yes  Other Topics Concern   Not on file  Social History Narrative   Not on file   Social Determinants of Health   Financial Resource Strain: Not on file  Food Insecurity: Not on file  Transportation Needs: Unmet Transportation Needs   Lack of Transportation (Medical): Yes   Lack of Transportation (Non-Medical): Yes  Physical Activity: Not on file  Stress: Not on file  Social Connections: Not on file  Intimate Partner Violence: Not on file    FAMILY  HISTORY: Family History  Problem Relation Age of Onset   Hypertension Mother    Hypertension Father    Hypertension Sister     ALLERGIES:  is allergic to benzalkonium chloride.  MEDICATIONS:  Current Outpatient Medications  Medication Sig Dispense Refill   amLODipine (NORVASC) 10 MG tablet TAKE 1 TABLET(10 MG) BY MOUTH DAILY (Patient taking differently: Take 10 mg by mouth daily.) 90 tablet 0   cromolyn (NASALCROM) 5.2 MG/ACT nasal spray Place 1 spray into both nostrils 2 (two) times daily as needed for allergies. 26 mL 0   ELIQUIS 5 MG TABS tablet TAKE 1 TABLET(5 MG) BY MOUTH TWICE DAILY (Patient taking differently: Take 5 mg by mouth 2 (two) times daily.) 180 tablet 0   Gemcitabine HCl (GEMZAR IV) Inject 1 Bag into the vein once a week. Pt gets for 30 minutes once per week     lidocaine-prilocaine (EMLA) cream Apply 1 application topically as needed. 30 g 0   loperamide (IMODIUM) 2 MG capsule Take 4 mg by mouth as needed for diarrhea or loose stools.     Multiple Vitamins-Minerals (PRESERVISION AREDS 2 PO) Take 1 tablet by mouth 2 (two) times daily.      ondansetron (ZOFRAN) 8 MG tablet TAKE 1 TABLET(8 MG) BY MOUTH EVERY 8 HOURS AS NEEDED FOR NAUSEA OR VOMITING 30 tablet 0   PACLitaxel Protein-Bound Part (ABRAXANE IV) Inject 1 Bag into the vein once a week. Pt gets for 30 minutes once per week     prochlorperazine (COMPAZINE) 10 MG tablet TAKE 1 TABLET(10 MG) BY MOUTH EVERY 6 HOURS AS NEEDED FOR NAUSEA OR VOMITING (Patient taking differently: Take 10 mg by mouth every 6 (six) hours as needed for nausea, vomiting or refractory nausea / vomiting.) 30 tablet 0   tamsulosin (FLOMAX) 0.4 MG CAPS capsule Take 1 capsule (0.4 mg total) by mouth daily after supper. 90 capsule 1   TRAVATAN Z 0.004 % SOLN ophthalmic solution Place 1 drop into the right eye at bedtime. [place]  12   triamcinolone (KENALOG) 0.1 % APPLY EXTERNALLY TO THE AFFECTED AREA TWICE DAILY (Patient taking differently: Apply 1  application topically daily as needed (psoriasis).) 45 g 5   Current Facility-Administered Medications  Medication Dose Route Frequency Provider Last Rate Last Admin   0.9 %  sodium chloride infusion   Intravenous Continuous Dede Query T, PA-C        REVIEW OF SYSTEMS:   Constitutional: ( - ) fevers, ( - )  chills , ( - ) night sweats Eyes: ( - ) blurriness of vision, ( - ) double vision, ( - ) watery eyes Ears, nose, mouth, throat, and face: ( - ) mucositis, ( - ) sore throat Respiratory: ( - ) cough, ( - ) dyspnea, ( - ) wheezes Cardiovascular: ( - ) palpitation, ( - ) chest discomfort, ( - ) lower extremity swelling Gastrointestinal:  ( +) nausea, ( - ) heartburn, ( - ) change in bowel habits Skin: ( - )  abnormal skin rashes Lymphatics: ( - ) new lymphadenopathy, ( - ) easy bruising Neurological: ( - ) numbness, ( - ) tingling, ( - ) new weaknesses Behavioral/Psych: ( - ) mood change, ( - ) new changes  All other systems were reviewed with the patient and are negative.  PHYSICAL EXAMINATION: ECOG PERFORMANCE STATUS: 3 - Symptomatic, >50% confined to bed  Vitals:   08/21/21 0917  BP: 93/62  Pulse: 76  Resp: 18  Temp: 97.8 F (36.6 C)  SpO2: 92%      Filed Weights   08/21/21 0917  Weight: 205 lb (93 kg)   GENERAL: Elderly Caucasian male, alert, no distress. Looks deconditioned.  SKIN: skin color, texture, turgor are normal, no rashes or significant lesions EYES: conjunctiva are pink and non-injected, sclera clear LUNGS: clear to auscultation and percussion with normal breathing effort HEART: regular rate & rhythm and no murmurs and no lower extremity edema Musculoskeletal: no cyanosis of digits and no clubbing  PSYCH: alert & oriented x 3, fluent speech NEURO: no focal motor/sensory deficits  LABORATORY DATA:  I have reviewed the data as listed CBC Latest Ref Rng & Units 08/21/2021 07/23/2021 07/22/2021  WBC 4.0 - 10.5 K/uL 5.8 7.6 6.5  Hemoglobin 13.0 - 17.0  g/dL 13.5 10.6(L) 10.5(L)  Hematocrit 39.0 - 52.0 % 40.8 33.9(L) 30.7(L)  Platelets 150 - 400 K/uL 153 484(H) 497(H)    CMP Latest Ref Rng & Units 08/21/2021 07/23/2021 07/22/2021  Glucose 70 - 99 mg/dL 92 96 98  BUN 8 - 23 mg/dL 21 13 12   Creatinine 0.61 - 1.24 mg/dL 1.32(H) 1.22 1.21  Sodium 135 - 145 mmol/L 141 143 141  Potassium 3.5 - 5.1 mmol/L 3.3(L) 3.7 4.4  Chloride 98 - 111 mmol/L 107 107 103  CO2 22 - 32 mmol/L 23 25 21   Calcium 8.9 - 10.3 mg/dL 9.4 9.0 8.7  Total Protein 6.5 - 8.1 g/dL 6.7 6.4(L) 5.8(L)  Total Bilirubin 0.3 - 1.2 mg/dL 0.7 0.5 0.4  Alkaline Phos 38 - 126 U/L 135(H) 198(H) 210(H)  AST 15 - 41 U/L 23 33 33  ALT 0 - 44 U/L 18 24 24     RADIOGRAPHIC STUDIES: I have personally reviewed the radiological images as listed and agreed with the findings in the report. No results found.  ASSESSMENT & PLAN DION SIBAL 75 y.o. male with medical history significant for metastatic adenocarcinoma of the pancreas who presents for a follow up visit.  After review of the labs, review of the records, and discussion with the patient the patients findings are most consistent with metastatic adenocarcinoma the pancreas.  This is based on the pathological findings and the distribution of the lymph node involvement.  Tumor markers ordered on 05/07/2021 support the diagnosis of pancreatic primary.  Lung adenocarcinoma was on the differential but at this time without a clear lung primary I would favor metastatic pancreatic cancer.   The currently chemotherapy regimen would consist of gemcitabine 1000 mg per metered squared IV on days 1, 8, 15 and albumin-bound paclitaxel 125 mg per metered squared on days 1, 8, 15 of a 28-day cycle.   # Metastatic Adenocarcinoma of the Pancreas -- Findings at this time are most consistent with metastatic adenocarcinoma the pancreas.   -- Tumor markers collected on 05/07/2021 show an AFP of 2.7, CA 19-9 of 1870, and a CEA of 75.47 --Current  regimen includes gemcitabine and Abraxane --Labs from today were reviewed requiring no intervention.  --Due to poor performance  status and difficulty tolerating chemotherapy, the recommendation is hospice care. Referral has been placed.  --Patient will return to the clinic on as needed basis.   #Anemia, chemotherapy induced --Hgb resolved, now 13.5 --continue to monitor  #Hypotension: --Today's BP is 93/63, likely secondary to poor PO intake/dehydration --Patient is symptomatic feeling dizzy and falling --Patient will receiving 1 L of IV fluids over two hours today --Currently on Norvasc 10 mg daily. Advised to hold at this time.    #Supportive Care -- chemotherapy education complete -- port placed -- zofran 8mg  q8H PRN and compazine 10mg  PO q6H for nausea -- EMLA cream for port -- no pain medication required at this time.   No orders of the defined types were placed in this encounter.   All questions were answered. The patient knows to call the clinic with any problems, questions or concerns.  I have spent a total of 30 minutes minutes of face-to-face and non-face-to-face time, preparing to see the [place] a medically appropriate examination, counseling and educating the patient, ordering medications, referring and communicating with other health care professionals, documenting clinical information in the electronic health record, and care coordination.    Lincoln Brigham, PA-C Department of Hematology/Oncology [facility] at [facility] Phone: [phone]   08/21/2021 10:23 AM  Patient was seen with Dr. Lorenso Courier.   I have read the above note and personally examined the patient. I agree with the assessment and plan as noted above.  Briefly Lee Kim is a 75 year old male well-known to our clinic with medical history significant for metastatic adenocarcinoma of the pancreas.  He has been poorly tolerating chemotherapy treatment and has  been suffering from severe deconditioning.  After a detailed discussion with the patient and his wife he was agreeable to referral to hospice.  I think this is most appropriate given his inability to tolerate treatment, has poor mobility, and his overall weakness.  Additionally metastatic adenocarcinoma of the pancreas carries with a poor prognosis.  Anticipate the patient has less than 6 months.  A referral was made to hospice today.  We also offered to continue to follow the patient in clinic or see him on a as needed basis.  He notes he would like the as-needed basis moving forward.   Ledell Peoples, MD Department of Hematology/Oncology [place] at [facility] Phone: [phone] Pager: [phone] Email: [email or web address]

## 2021-08-21 NOTE — Patient Instructions (Signed)

## 2021-08-26 ENCOUNTER — Encounter: Payer: Self-pay | Admitting: Hematology and Oncology

## 2021-09-02 DIAGNOSIS — G4733 Obstructive sleep apnea (adult) (pediatric): Secondary | ICD-10-CM | POA: Diagnosis not present

## 2021-09-03 ENCOUNTER — Ambulatory Visit: Payer: PPO | Admitting: Hematology and Oncology

## 2021-09-03 ENCOUNTER — Ambulatory Visit: Payer: PPO

## 2021-09-03 ENCOUNTER — Other Ambulatory Visit: Payer: PPO

## 2021-09-16 ENCOUNTER — Other Ambulatory Visit: Payer: Self-pay | Admitting: Family Medicine

## 2021-09-16 DIAGNOSIS — I10 Essential (primary) hypertension: Secondary | ICD-10-CM

## 2021-09-16 DIAGNOSIS — I482 Chronic atrial fibrillation, unspecified: Secondary | ICD-10-CM

## 2021-09-18 ENCOUNTER — Other Ambulatory Visit: Payer: Self-pay | Admitting: Family Medicine

## 2021-09-18 DIAGNOSIS — E785 Hyperlipidemia, unspecified: Secondary | ICD-10-CM

## 2021-09-23 ENCOUNTER — Encounter: Payer: Self-pay | Admitting: Hematology and Oncology

## 2021-10-04 ENCOUNTER — Encounter: Payer: Self-pay | Admitting: Hematology and Oncology

## 2021-10-06 ENCOUNTER — Encounter (INDEPENDENT_AMBULATORY_CARE_PROVIDER_SITE_OTHER): Payer: PPO | Admitting: Ophthalmology

## 2021-10-16 ENCOUNTER — Telehealth: Payer: Self-pay | Admitting: *Deleted

## 2021-10-16 NOTE — Telephone Encounter (Signed)
Received call from pt's wife.  She states that she has received a denial from her husband's insurance for a walker he received in October.  She asked if I could find out more information about this.  Advised that I would research this and call her back. Upon chart review. This and other DME (hospital bed, [facility]) were ordered upon hospital discharge on July 17, 2021. It did not come from this office. Attempted call back to pt's wife. Was informed by pt that she has stepped out for a bit but he will have her call back to this office. Will provide billing # with [facility] [phone] to her when she calls back

## 2021-12-08 ENCOUNTER — Telehealth: Payer: Self-pay | Admitting: Family Medicine

## 2021-12-08 NOTE — Telephone Encounter (Signed)
Pt's spouse called and states that pt passed away 2022-12-22 morning. Pt was under hospice care. Sending to Cameron and Beverlee Nims so chart can be noted.  ?

## 2022-01-02 DEATH — deceased

## 2022-07-30 IMAGING — US US THYROID
1 series · 12 of 25 positions shown · non-contrast
Comparison: 11/28/2019

10/19/2016

09/20/2016

CLINICAL DATA: Goiter.

Thyroid nodule
EXAM:
THYROID ULTRASOUND
TECHNIQUE: Ultrasound examination of the thyroid gland and adjacent soft
tissues was performed.

[Series 1: us thyroid · 0.09mm/px · 12 of 69 slices shown]
[im 3/69]
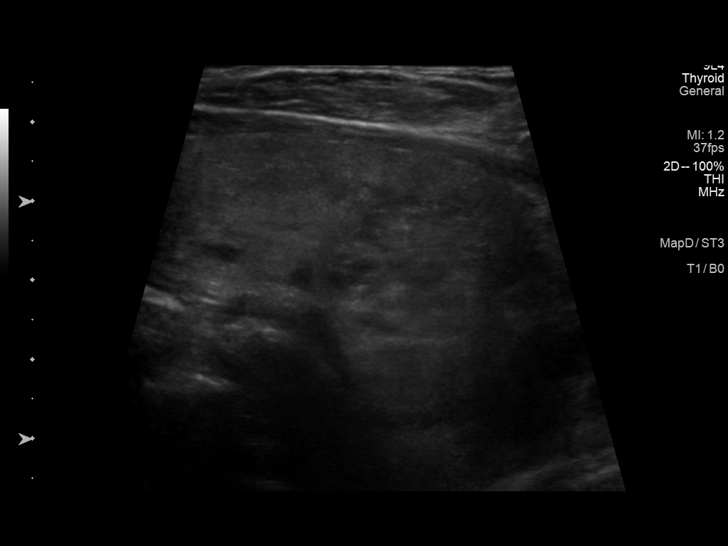
[im 9/69]
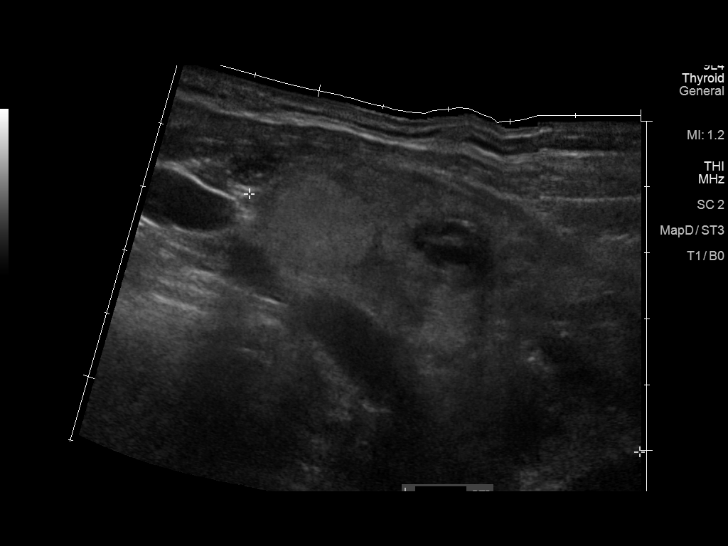
[im 15/69]
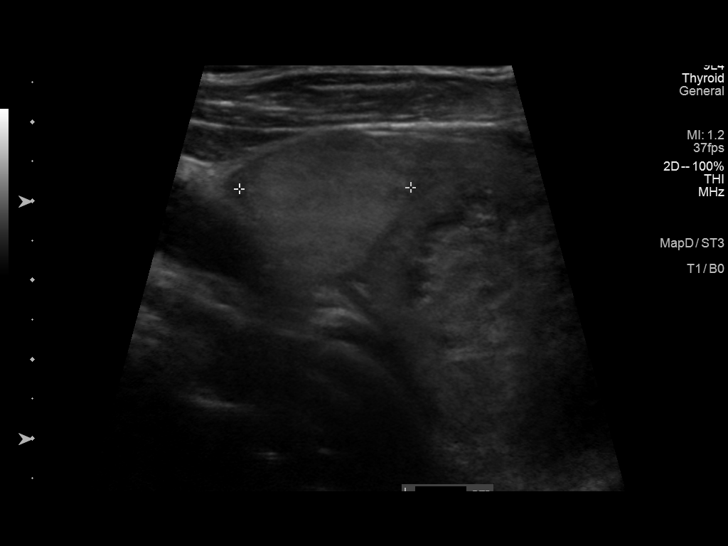
[im 20/69]
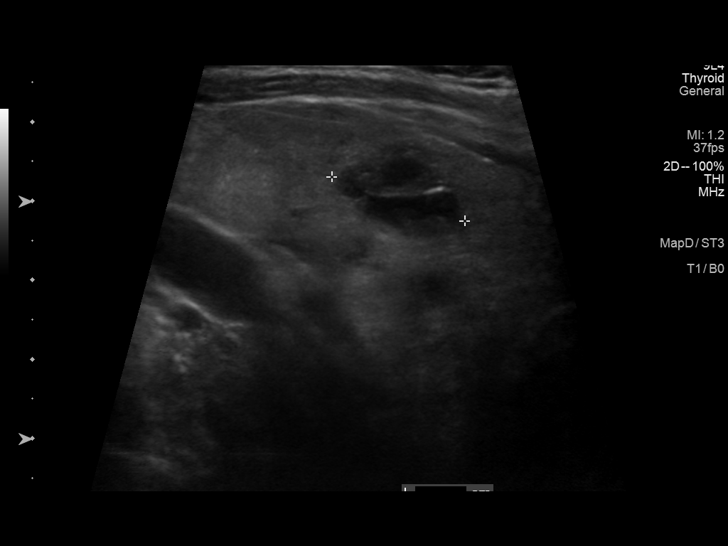
[im 26/69]
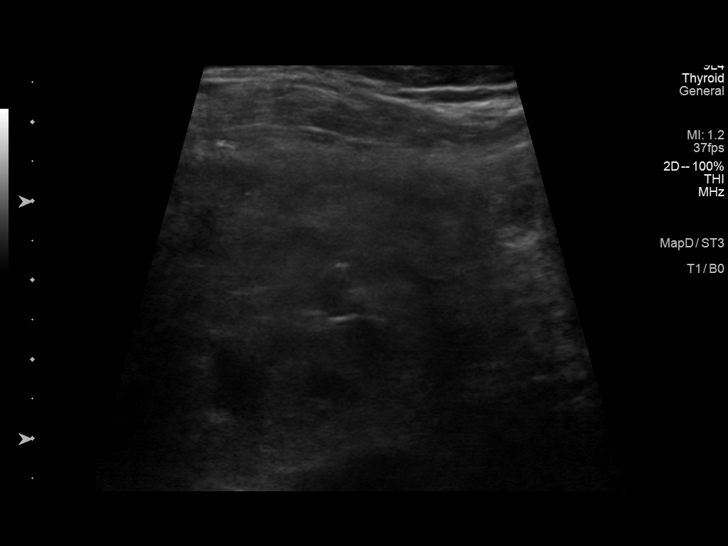
[im 32/69]
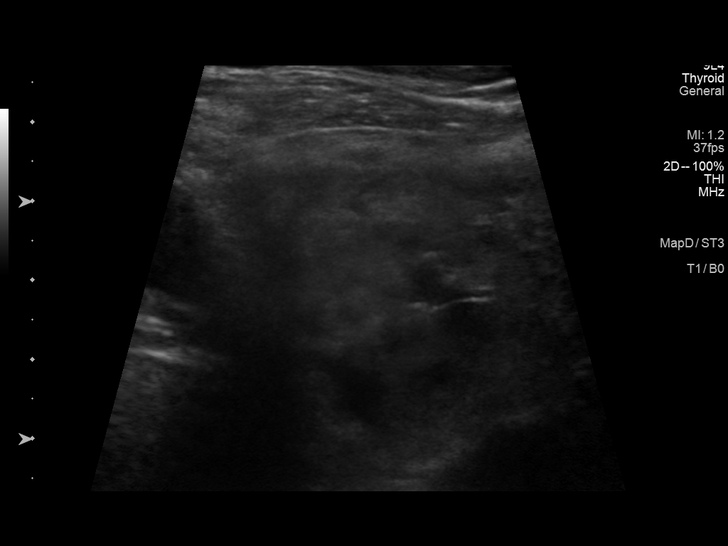
[im 37/69]
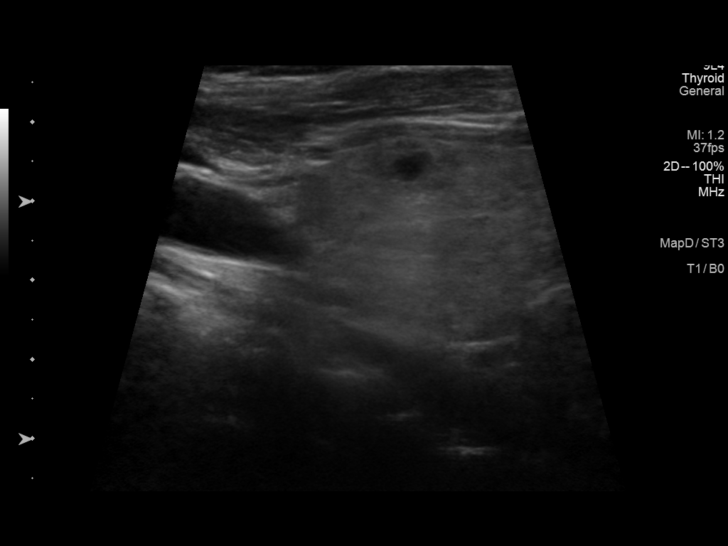
[im 43/69]
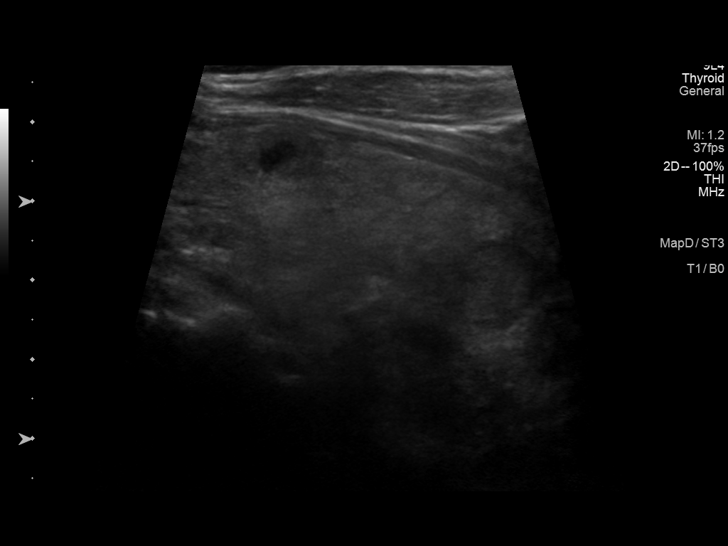
[im 49/69]
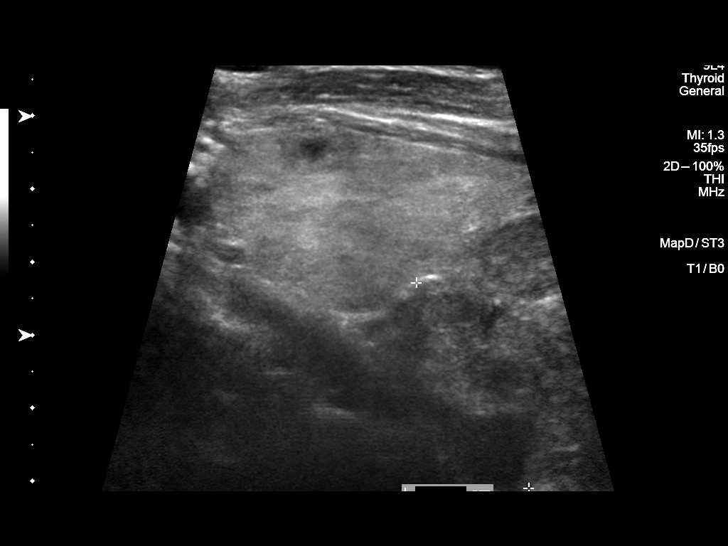
[im 54/69]
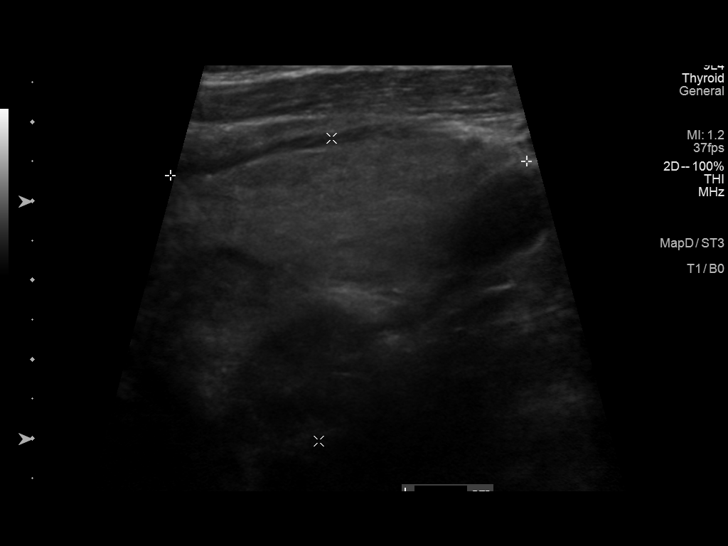
[im 60/69]
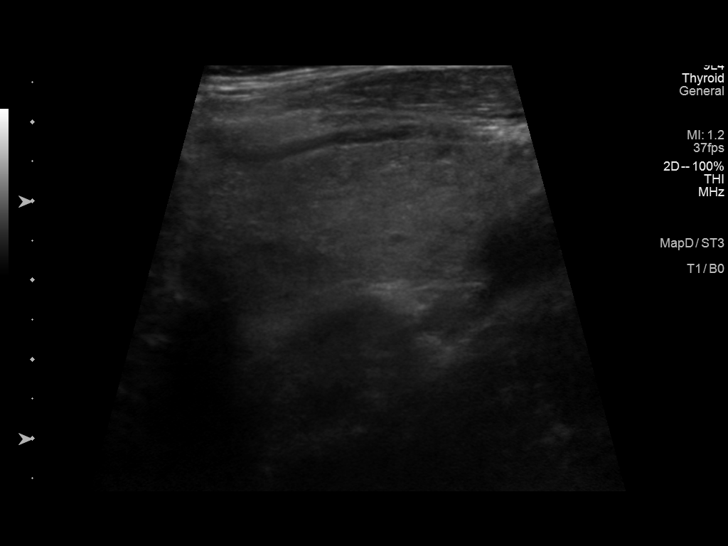
[im 66/69]
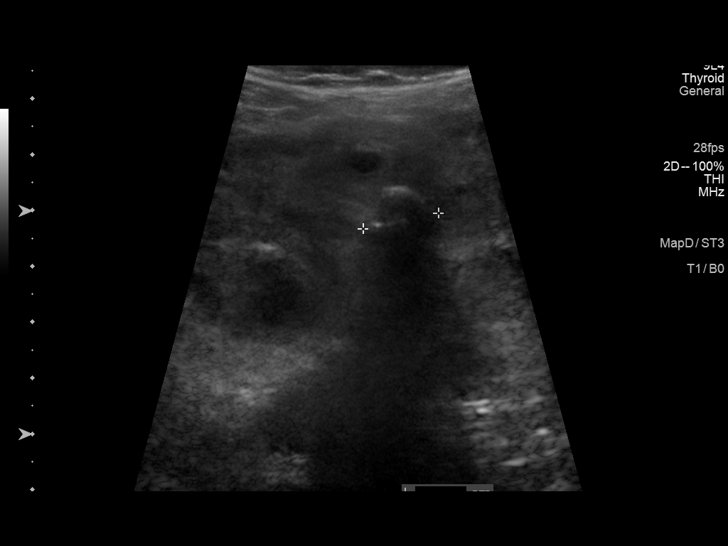

[12 of 25 positions shown; findings below may reference images not displayed]

FINDINGS: Parenchymal Echotexture: Moderately heterogeneous

Isthmus: 2.3 cm

Right lobe: 7.4 x 3.8 x 4.9 cm

Left lobe: 7.8 x 3.8 x 4.5 cm

_________________________________________________________

Estimated total number of nodules >/= 1 cm: 5

Number of spongiform nodules >/=  2 cm not described below (TR1): 0

Number of mixed cystic and solid nodules >/= 1.5 cm not described
below (TR2): 0

_________________________________________________________

Nodule # 1:

Prior biopsy: No

Location: Right; superior

Maximum size: 2.2 cm; Other 2 dimensions: 1.7 x 1.6 cm, previously,
2.2 x 1.6 x 1.7 cm on 11/28/2019 and 2.0 x 1.4 x 1.3 cm on
09/20/2016.

Composition: solid/almost completely solid (2)

Echogenicity: isoechoic (1)

Shape: not taller-than-wide (0)

Margins: smooth (0)

Echogenic foci: none (0)

ACR TI-RADS total points: 3.

ACR TI-RADS risk category:  TR3 (3 points).

Significant change in size (>/= 20% in two dimensions and minimal
increase of 2 mm): No

Change in features: No

Change in ACR TI-RADS risk category: No

ACR TI-RADS recommendations:

*Given size (>/= 1.5 - 2.4 cm) and appearance, a follow-up
ultrasound in 1 year should be considered based on TI-RADS criteria.

_________________________________________________________

Nodule 2: 1.8 x 1.5 x 1.2 cm spongiform nodule in the mid right
thyroid lobe is not significantly changed in size since prior exam.
This does not meet criteria for FNA or imaging follow-up.

_________________________________________________________

Nodule # 3:

Location: Right; inferior

Maximum size: 2.3 cm; Other 2 dimensions: 2.2 x 1.6 cm

Composition: solid/almost completely solid (2)

Echogenicity: hypoechoic (2)

Shape: not taller-than-wide (0)

Margins: ill-defined (0)

Echogenic foci: none (0)

ACR TI-RADS total points: 4.

ACR TI-RADS risk category: TR4 (4-6 points).

ACR TI-RADS recommendations:

**Given size (>/= 1.5 cm) and appearance, fine needle aspiration of
this moderately suspicious nodule should be considered based on
TI-RADS criteria.

_________________________________________________________

Nodule # 4:

Prior biopsy: No

Location: Left; Inferior

Maximum size: 1.5 cm; Other 2 dimensions: 1.2 x 1.5 cm, previously,
1.5 x 1.1 x 1.2 cm on 11/28/2019 and 1.2 x 0.9 x 1.0 cm on
09/10/2016.

Composition: solid/almost completely solid (2)

Echogenicity: hypoechoic (2)

Shape: not taller-than-wide (0)

Margins: smooth (0)

Echogenic foci: none (0)

ACR TI-RADS total points: 4.

ACR TI-RADS risk category:  TR4 (4-6 points).

Significant change in size (>/= 20% in two dimensions and minimal
increase of 2 mm): No

Change in features: No

Change in ACR TI-RADS risk category: No

ACR TI-RADS recommendations:

**Given size (>/= 1.5 cm) and appearance, fine needle aspiration of
this moderately suspicious nodule should be considered based on
TI-RADS criteria.

_________________________________________________________

Nodule 5: 3.2 x 2.9 x 2.3 cm hypoechoic solid nodule located at the
inferior left thyroid lobe is not significant changed in size since
prior examination. Prior biopsy of this nodule was performed on
10/19/2016. Please correlate with results.
IMPRESSION: 1. Nodule 4 (TI-RADS 4) located in the inferior left thyroid lobe
measures 1.5 x 1.2 x 1.5 cm. It now meets criteria for FNA.
2. Nodule 3 (TI-RADS 4) located in the inferior right thyroid lobe
measuring 2.3 x 1.6 x 2.2 cm is new compared to prior exams and
meets criteria for FNA.
3. Nodule 1 (TI-RADS 3) located in the superior right thyroid lobe
measuring 2.2 x 1.7 x 0.6 cm is not significantly changed in size
since 4144. Follow-up ultrasound recommended in 1 year to document 5
years of stability.

The above is in keeping with the ACR TI-RADS recommendations - [HOSPITAL] 4144;[DATE].

## 2022-08-16 IMAGING — US US FNA BIOPSY THYROID 1ST LESION
1 series · 13 of 13 positions shown · non-contrast
Comparison: Thyroid ultrasound 11/17/2020

MEDICATIONS:
None

COMPLICATIONS:
None immediate.

INDICATION: Indeterminate thyroid nodule.

EXAM:
ULTRASOUND GUIDED FINE NEEDLE ASPIRATION OF INDETERMINATE THYROID
NODULE
TECHNIQUE: Informed written consent was obtained from the patient after a
discussion of the risks, benefits and alternatives to treatment.
Questions regarding the procedure were encouraged and answered. A
timeout was performed prior to the initiation of the procedure.

[Series 1: us fna biopsy thyroid 1st lesion · 0.10mm/px · 13 acquisitions, 13 frames shown]
[im 1/13]
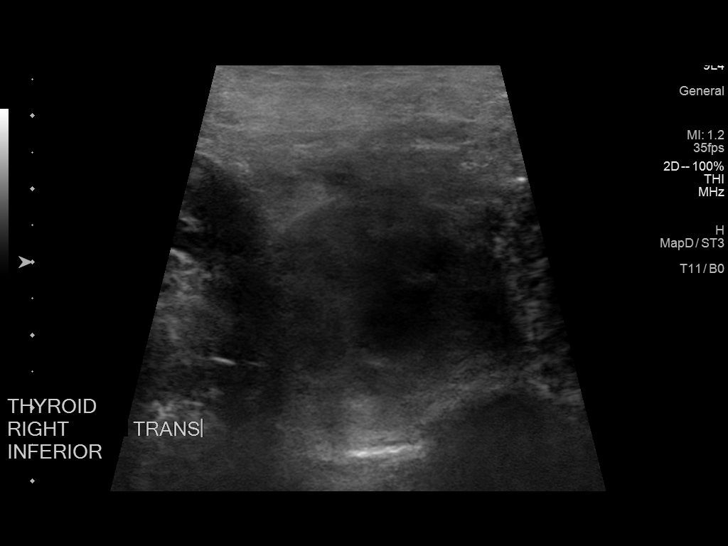
[im 2/13]
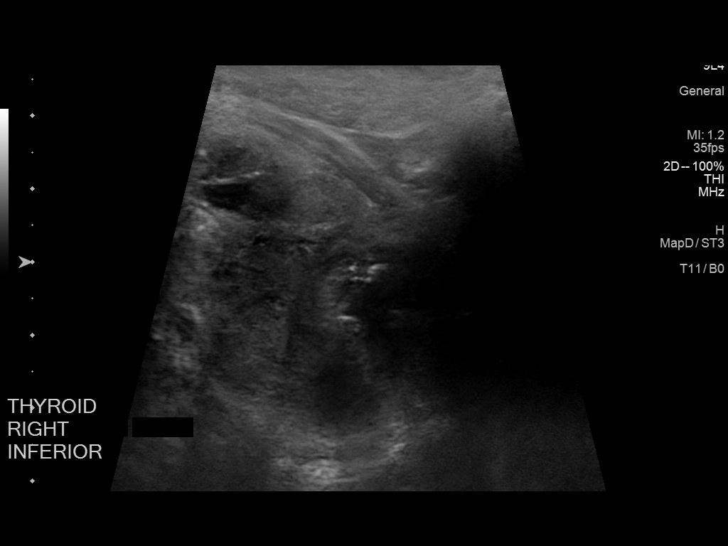
[im 3/13]
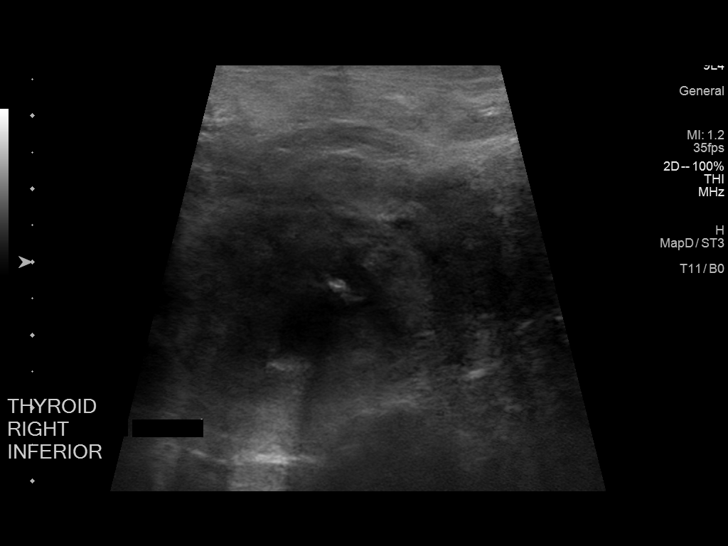
[im 4/13]
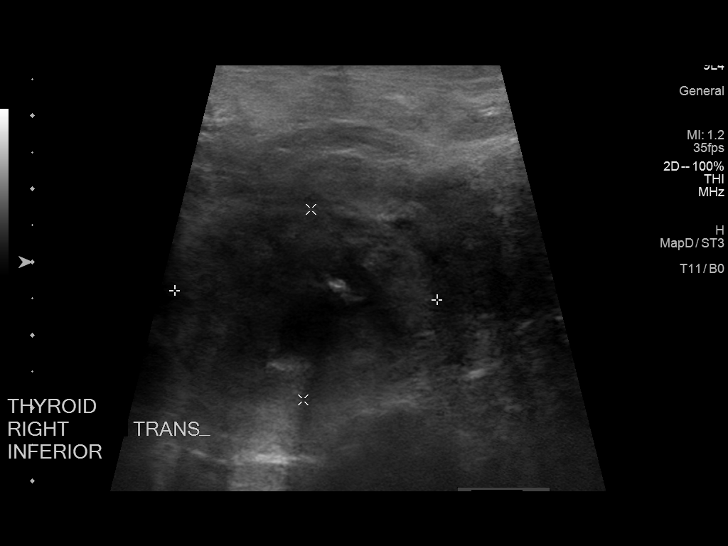
[im 5/13]
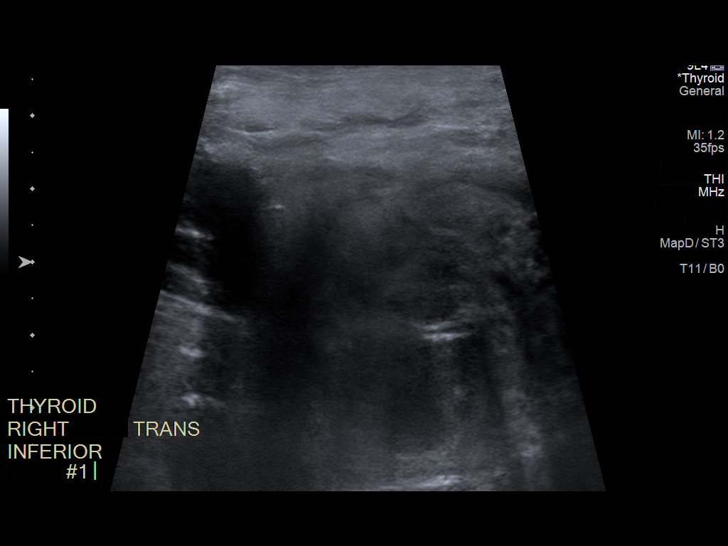
[im 6/13]
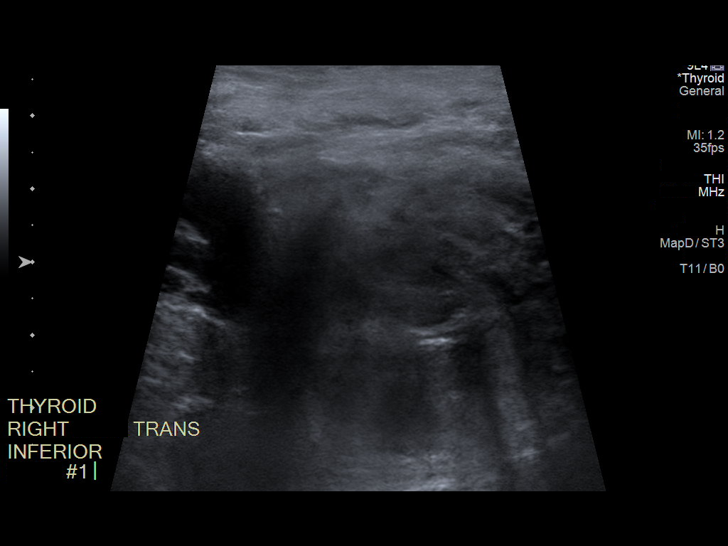
[im 7/13]
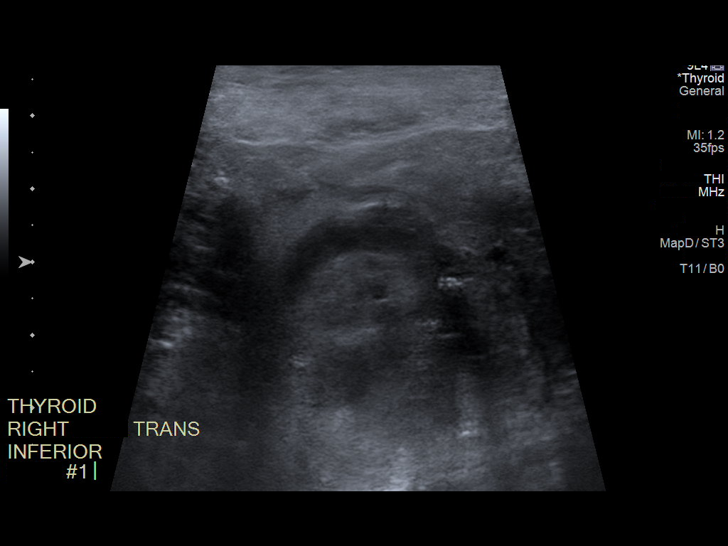
[im 8/13]
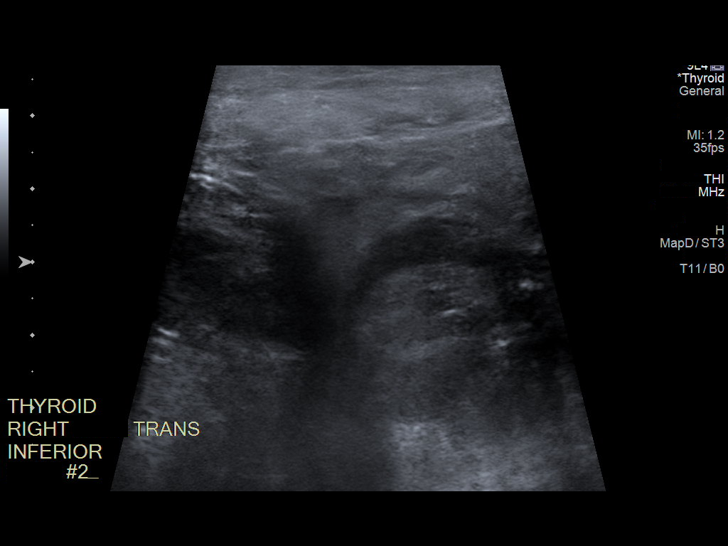
[im 9/13]
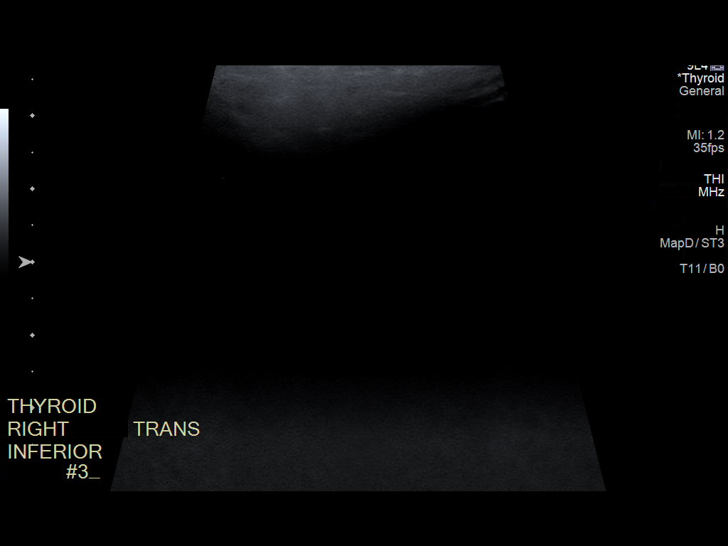
[im 10/13]
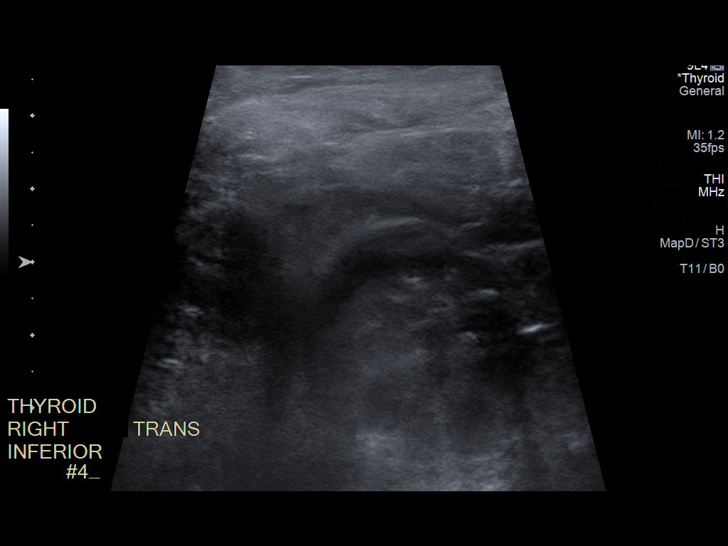
[im 11/13]
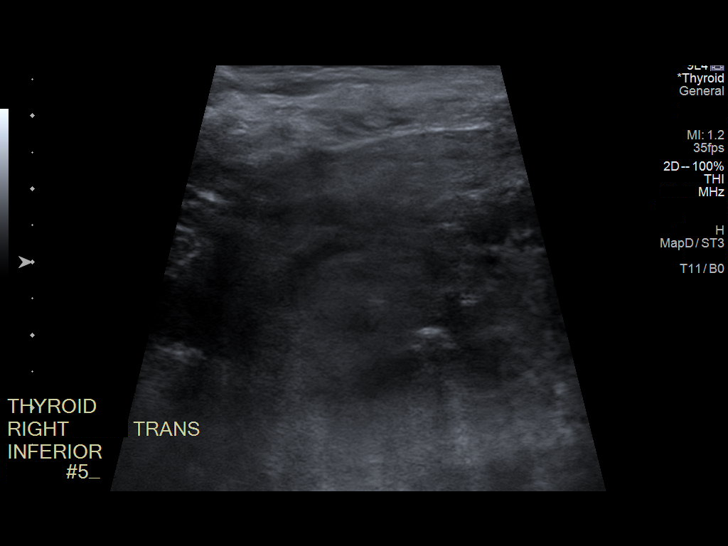
[im 12/13]
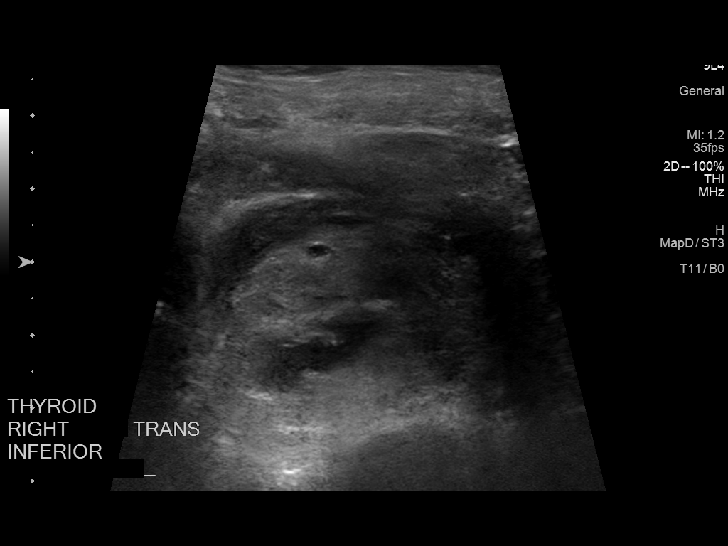
[im 13/13]
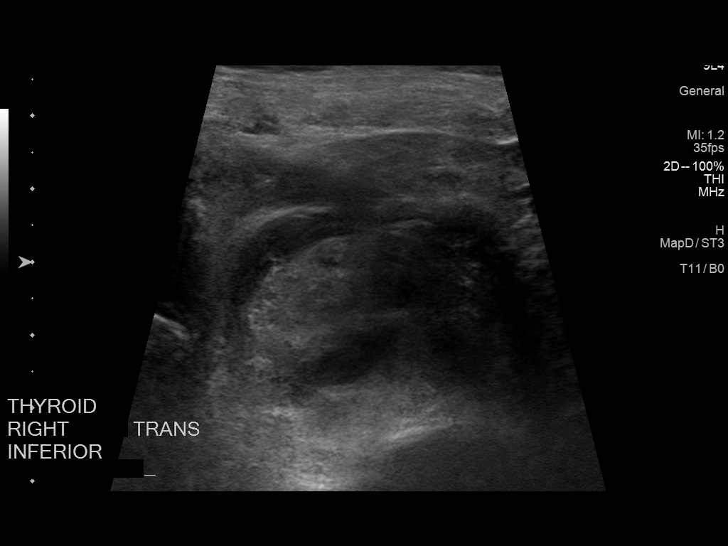

[13 of 13 positions shown; findings below may reference images not displayed]

Pre-procedural ultrasound scanning demonstrated unchanged size and
appearance of the indeterminate nodule within the right inferior
thyroid lobe.

The procedure was planned. The neck was prepped in the usual sterile
fashion, and a sterile drape was applied covering the operative
field. A timeout was performed prior to the initiation of the
procedure. Local anesthesia was provided with 1% lidocaine.

Under direct ultrasound guidance, 5 FNA biopsies were performed with
25 and 22 gauge needles. Multiple ultrasound images were saved for
procedural documentation purposes. The samples were prepared and
submitted to pathology. Two specimens were submitted for [place]irma.

Limited post procedural scanning was negative for hematoma or
additional complication. Dressings were placed. The patient
tolerated the above procedures procedure well without immediate
postprocedural complication.
FINDINGS: Nodule reference number based on prior diagnostic ultrasound: 3

Maximum size: 3.6 cm

Location: Right; Inferior

ACR TI-RADS risk category: TR4 (4-6 points)

Reason for biopsy: meets ACR TI-RADS criteria

Ultrasound imaging confirms appropriate placement of the needles
within the thyroid nodule.
IMPRESSION: Technically successful ultrasound guided fine needle aspiration of
right inferior thyroid nodule.
